# Patient Record
Sex: Male | Born: 1960
Health system: Southern US, Community
[De-identification: ages and names within clinical notes are randomized; demographics above are authoritative.]

## PROBLEM LIST (undated history)

## (undated) DIAGNOSIS — H9190 Unspecified hearing loss, unspecified ear: Secondary | ICD-10-CM

## (undated) DIAGNOSIS — Z8661 Personal history of infections of the central nervous system: Secondary | ICD-10-CM

## (undated) DIAGNOSIS — J45909 Unspecified asthma, uncomplicated: Secondary | ICD-10-CM

## (undated) DIAGNOSIS — K922 Gastrointestinal hemorrhage, unspecified: Secondary | ICD-10-CM

## (undated) DIAGNOSIS — E669 Obesity, unspecified: Secondary | ICD-10-CM

## (undated) DIAGNOSIS — C61 Malignant neoplasm of prostate: Secondary | ICD-10-CM

## (undated) DIAGNOSIS — Z9189 Other specified personal risk factors, not elsewhere classified: Secondary | ICD-10-CM

## (undated) DIAGNOSIS — I10 Essential (primary) hypertension: Secondary | ICD-10-CM

## (undated) DIAGNOSIS — Z87898 Personal history of other specified conditions: Secondary | ICD-10-CM

## (undated) DIAGNOSIS — T7840XA Allergy, unspecified, initial encounter: Secondary | ICD-10-CM

## (undated) HISTORY — DX: Gastrointestinal hemorrhage, unspecified: K92.2

## (undated) HISTORY — DX: Unspecified hearing loss, unspecified ear: H91.90

## (undated) HISTORY — DX: Unspecified asthma, uncomplicated: J45.909

## (undated) HISTORY — PX: PROSTATE BIOPSY: SHX241

## (undated) HISTORY — DX: Malignant neoplasm of prostate: C61

## (undated) HISTORY — DX: Allergy, unspecified, initial encounter: T78.40XA

## (undated) HISTORY — DX: Obesity, unspecified: E66.9

---

## 2012-11-17 ENCOUNTER — Encounter (HOSPITAL_BASED_OUTPATIENT_CLINIC_OR_DEPARTMENT_OTHER): Payer: Self-pay | Admitting: Emergency Medicine

## 2012-11-17 ENCOUNTER — Emergency Department (HOSPITAL_BASED_OUTPATIENT_CLINIC_OR_DEPARTMENT_OTHER)
Admission: EM | Admit: 2012-11-17 | Discharge: 2012-11-17 | Disposition: A | Discharge status: Home / Self Care | Payer: 59 | Attending: Emergency Medicine | Admitting: Emergency Medicine

## 2012-11-17 ENCOUNTER — Emergency Department (HOSPITAL_BASED_OUTPATIENT_CLINIC_OR_DEPARTMENT_OTHER): Payer: 59

## 2012-11-17 DIAGNOSIS — I10 Essential (primary) hypertension: Secondary | ICD-10-CM | POA: Insufficient documentation

## 2012-11-17 DIAGNOSIS — X58XXXA Exposure to other specified factors, initial encounter: Secondary | ICD-10-CM | POA: Insufficient documentation

## 2012-11-17 DIAGNOSIS — F17200 Nicotine dependence, unspecified, uncomplicated: Secondary | ICD-10-CM | POA: Insufficient documentation

## 2012-11-17 DIAGNOSIS — S335XXA Sprain of ligaments of lumbar spine, initial encounter: Secondary | ICD-10-CM | POA: Insufficient documentation

## 2012-11-17 DIAGNOSIS — Y939 Activity, unspecified: Secondary | ICD-10-CM | POA: Insufficient documentation

## 2012-11-17 DIAGNOSIS — Y929 Unspecified place or not applicable: Secondary | ICD-10-CM | POA: Insufficient documentation

## 2012-11-17 DIAGNOSIS — S39012A Strain of muscle, fascia and tendon of lower back, initial encounter: Secondary | ICD-10-CM

## 2012-11-17 DIAGNOSIS — Z79899 Other long term (current) drug therapy: Secondary | ICD-10-CM | POA: Insufficient documentation

## 2012-11-17 HISTORY — DX: Essential (primary) hypertension: I10

## 2012-11-17 LAB — URINALYSIS, ROUTINE W REFLEX MICROSCOPIC
Glucose, UA: NEGATIVE mg/dL
Hgb urine dipstick: NEGATIVE
Leukocytes, UA: NEGATIVE
Nitrite: NEGATIVE
Specific Gravity, Urine: 1.013 (ref 1.005–1.030)
Urobilinogen, UA: 0.2 mg/dL (ref 0.0–1.0)

## 2012-11-17 MED ORDER — HYDROCODONE-ACETAMINOPHEN 5-325 MG PO TABS: 2.0000 | ORAL_TABLET | ORAL | Status: DC | PRN

## 2012-11-17 MED ORDER — PREDNISONE 10 MG PO TABS: ORAL_TABLET | ORAL | Status: DC

## 2012-11-17 NOTE — ED Provider Notes (Signed)
CSN: [id number]     Arrival date & time 11/17/12  1340 History   First MD Initiated Contact with Patient 11/17/12 1557     Chief Complaint  Patient presents with  . Flank Pain   (Consider location/radiation/quality/duration/timing/severity/associated sxs/prior Treatment) Patient is a 52 y.o. male presenting with back pain. The history is provided by the patient. No language interpreter was used.  Back Pain Location:  Lumbar spine Quality:  Aching Radiates to:  Does not radiate Pain severity:  Moderate Onset quality:  Gradual Duration:  1 week Progression:  Worsening Relieved by:  Nothing Worsened by:  Movement and standing Ineffective treatments:  NSAIDs and muscle relaxants Associated symptoms: no abdominal pain   Pt treated at urgent care for back pain.  Pt reports no relief with medications.  Pt reports pain is worse with movement  Past Medical History  Diagnosis Date  . Hypertension    No past surgical history on file. No family history on file. History  Substance Use Topics  . Smoking status: Current Some Day Smoker  . Smokeless tobacco: Not on file  . Alcohol Use: Not on file    Review of Systems  Gastrointestinal: Negative for abdominal pain.  Musculoskeletal: Positive for back pain.  All other systems reviewed and are negative.    Allergies  Review of patient's allergies indicates no known allergies.  Home Medications   Current Outpatient Rx  Name  Route  Sig  Dispense  Refill  . amLODipine (NORVASC) 10 MG tablet   Oral   Take 10 mg by mouth daily.         . cyclobenzaprine (FLEXERIL) 10 MG tablet   Oral   Take 10 mg by mouth at bedtime as needed for muscle spasms.         Marland Kitchen etodolac (LODINE) 400 MG tablet   Oral   Take 400 mg by mouth 2 (two) times daily as needed.         Marland Kitchen losartan-hydrochlorothiazide (HYZAAR) 100-25 MG per tablet   Oral   Take 1 tablet by mouth daily.         . traMADol (ULTRAM) 50 MG tablet   Oral   Take 50 mg  by mouth every 6 (six) hours as needed for pain.          BP 145/81  Pulse 96  Temp(Src) 98.6 F (37 C) (Oral)  Resp 20  Ht 6\' 5"  (1.956 m)  Wt 298 lb (135.172 kg)  BMI 35.33 kg/m2  SpO2 98% Physical Exam  Nursing note and vitals reviewed. Constitutional: He is oriented to person, place, and time. He appears well-developed and well-nourished.  HENT:  Head: Normocephalic.  Eyes: Pupils are equal, round, and reactive to light.  Neck: Normal range of motion.  Cardiovascular: Normal rate and normal heart sounds.   Pulmonary/Chest: Effort normal.  Abdominal: Soft.  Musculoskeletal: He exhibits tenderness.  diffusely tender ls spine and flank  Pain with movement  nv and ns intact  Neurological: He is alert and oriented to person, place, and time. He has normal reflexes.  Skin: Skin is warm.  Psychiatric: He has a normal mood and affect.    ED Course  Procedures (including critical care time) Labs Review Labs Reviewed  URINALYSIS, ROUTINE W REFLEX MICROSCOPIC - Abnormal; Notable for the following:    Bilirubin Urine MODERATE (*)    All other components within normal limits   Imaging Review No results found.  MDM   1. Lumbar strain,  initial encounter    RX for Hydrocodone and prednisone.   Pt advised to see Dr. Pearletha Forge for recheck this week   Elson Areas, PA-C 11/17/12 1732

## 2012-11-17 NOTE — ED Provider Notes (Signed)
History/physical exam/procedure(s) were performed by non-physician practitioner and as supervising physician I was immediately available for consultation/collaboration. I have reviewed all notes and am in agreement with care and plan.   Hilario Quarry, MD 11/17/12 [phone]

## 2012-11-17 NOTE — ED Notes (Signed)
Pt having left flank pain x one week.  No urinary symptoms, no fever.  Pt seen at urgent care and was treated for "nerve pain".

## 2013-11-07 ENCOUNTER — Other Ambulatory Visit (HOSPITAL_COMMUNITY): Payer: Self-pay | Admitting: Urology

## 2013-11-07 DIAGNOSIS — C61 Malignant neoplasm of prostate: Secondary | ICD-10-CM

## 2013-11-21 ENCOUNTER — Encounter (HOSPITAL_COMMUNITY): Payer: 59

## 2013-11-21 ENCOUNTER — Ambulatory Visit (HOSPITAL_COMMUNITY): Payer: 59

## 2014-01-09 ENCOUNTER — Encounter: Payer: Self-pay | Admitting: Radiation Oncology

## 2014-01-09 NOTE — Progress Notes (Signed)
GU Location of Tumor / Histology: prostatic adenocarcinoma  If Prostate Cancer, Gleason Score is (3 + 3) and PSA is (12.79)  Peter Camacho presented with a elevated PSA 01/03/2013.  Biopsies of prostate (if applicable) revealed:    Past/Anticipated interventions by urology, if any: biopsy and referral to radiation oncology  Past/Anticipated interventions by medical oncology, if any: no  Weight changes, if any: no  Bowel/Bladder complaints, if any:    Nausea/Vomiting, if any: no  Pain issues, if any:  no  SAFETY ISSUES:  Prior radiation?   Pacemaker/ICD?   Possible current pregnancy? no  Is the patient on methotrexate? no  Current Complaints / other details:  53 year old year. Interested in radioactive seed implantation. Prostate volume 38.5 cc.

## 2014-01-11 ENCOUNTER — Encounter: Payer: Self-pay | Admitting: Radiation Oncology

## 2014-01-11 DIAGNOSIS — C61 Malignant neoplasm of prostate: Secondary | ICD-10-CM | POA: Insufficient documentation

## 2014-01-11 NOTE — Progress Notes (Signed)
Radiation Oncology         [phone] ________________________________  Initial outpatient Consultation  Name: Peter Camacho MRN: [id number]  Date: 01/12/2014  DOB: Dec 17, 1960  LK:GMWN, Peter Camacho., MD   REFERRING PHYSICIAN: Sharlene Camacho., MD  DIAGNOSIS: 53 y.o. gentleman with stage T1c adenocarcinoma of the prostate with a Gleason's score of 3+4 and a PSA of 12.79    ICD-9-CM ICD-10-CM   1. Prostate Cancer with a Gleason's Score of 3+4 and a PSA of 12.79 Peter Camacho is a 53 y.o. gentleman.  He was noted to have an elevated PSA of 7.93 prompting prostate biopsy with Peter Camacho in [facility] on 01/03/13 revealing a 38.5 mL gland and 4 out of 12 biopsis were positive for Gleason's 3+3.  The patient selected active surveillance.   He was seen in evaluation in urology by Peter Camacho and felt to have T1c disease.  PSA on 03/19/13 was 8.38 and on 09/16/13 was 12.79.  The patient proceeded to transrectal ultrasound with 12 biopsies of the prostate on 10/24/13.  The prostate volume measured 48.32 cc.  Out of 12 core biopsies, 6 were positive.  The maximum Gleason score was 3+4, and this was seen in the distribution displayed below.    The patient reviewed the biopsy results with his urologist and underwent staging CT and bone scan showing no metastatic disease.  He has kindly been referred today for discussion of potential radiation treatment options.  PREVIOUS RADIATION THERAPY: No  PAST MEDICAL HISTORY:  has a past medical history of Hypertension and Prostate cancer.    PAST SURGICAL HISTORY: Past Surgical History  Procedure Laterality Date  . Prostate biopsy      FAMILY HISTORY: family history is negative for Cancer.  SOCIAL HISTORY:  reports that he has been smoking Cigarettes.  He has been smoking about 0.00 packs per day. He has never used smokeless tobacco. He reports that he does not drink alcohol or use illicit  drugs.  ALLERGIES: Fish-derived products  MEDICATIONS:  Current Outpatient Prescriptions  Medication Sig Dispense Refill  . amLODipine (NORVASC) 10 MG tablet Take 10 mg by mouth daily.    Marland Kitchen losartan-hydrochlorothiazide (HYZAAR) 100-25 MG per tablet Take 1 tablet by mouth daily.     No current facility-administered medications for this encounter.    REVIEW OF SYSTEMS:  A 15 point review of systems is documented in the electronic medical record. This was obtained by the nursing staff. However, I reviewed this with the patient to discuss relevant findings and make appropriate changes.   .  The patient completed an IPSS and IIEF questionnaire.  His IPSS score was 3 indicating mild urinary outflow obstructive symptoms.  He indicated that his erectile function is able to complete sexual activity on most attempts.   PHYSICAL EXAM: This patient is in no acute distress.  He is alert and oriented.   weight is 323 lb 9.6 oz (146.784 kg). His blood pressure is 145/77 and his pulse is 105. His respiration is 18.  He exhibits no respiratory distress or labored breathing.  He appears neurologically intact.  His mood is pleasant.  His affect is appropriate.  Please note the digital rectal exam findings described above.  KPS = 100  100 - Normal; no complaints; no evidence of disease. 90   - Able to carry on normal activity; minor signs or symptoms of disease. 80   - Normal  activity with effort; some signs or symptoms of disease. 73   - Cares for self; unable to carry on normal activity or to do active work. 60   - Requires occasional assistance, but is able to care for most of his personal needs. 50   - Requires considerable assistance and frequent medical care. 60   - Disabled; requires special care and assistance. 6   - Severely disabled; hospital admission is indicated although death not imminent. 38   - Very sick; hospital admission necessary; active supportive treatment necessary. 10   - Moribund;  fatal processes progressing rapidly. 0     - Dead  Karnofsky DA, Abelmann WH, Craver LS and Burchenal [facility] [phone]) The use of the nitrogen mustards in the palliative treatment of carcinoma: with particular reference to bronchogenic carcinoma Cancer 1 [phone]    RADIOGRAPHY: Bone Scan and CT Abd/Pelvis show no mets.    IMPRESSION: This gentleman is a 53 y.o. gentleman with stage T1c adenocarcinoma of the prostate with a Gleason's score of 3+4 and a PSA of 12.79.  His T-Stage, Gleason's Score, and PSA put him into the intermediate risk group.  He falls into a select subset of intermediate risk patients with a primary Gleason grade of 3 and less than one half of 1 lobe involvement with intermediate risk disease, eligible for prostate seed implant as monotherapy.Accordingly he is eligible for a variety of potential treatment options including robotic-assisted-laparoscopic radical prostatectomy, external beam radiation treatment, and prostate seed implant as monotherapy.Marland Kitchen  PLAN:Today I reviewed the findings and workup thus far.  We discussed the natural history of prostate cancer.  We reviewed the the implications of T-stage, Gleason's Score, and PSA on decision-making and outcomes in prostate cancer.  We discussed radiation treatment in the management of prostate cancer with regard to the logistics and delivery of external beam radiation treatment as well as the logistics and delivery of prostate brachytherapy.  We compared and contrasted each of these approaches and also compared these against prostatectomy.  The patient expressed interest in prostate brachytherapy.  I filled out a patient counseling form for him with relevant treatment diagrams and we retained a copy for our records.   The patient would like to proceed with prostate brachytherapy.  I will share my findings with Peter Camacho and move forward with scheduling the procedure in the near future.     I enjoyed meeting with him today, and will look  forward to participating in the care of this very nice gentleman.   I spent 60 minutes face to face with the patient and more than 50% of that time was spent in counseling and/or coordination of care.   ------------------------------------------------  Sheral Apley. Tammi Klippel, M.D.

## 2014-01-12 ENCOUNTER — Ambulatory Visit
Admission: RE | Admit: 2014-01-12 | Discharge: 2014-01-12 | Disposition: A | Discharge status: Home / Self Care | Payer: 59 | Source: Ambulatory Visit | Attending: Radiation Oncology | Admitting: Radiation Oncology

## 2014-01-12 ENCOUNTER — Telehealth: Payer: Self-pay | Admitting: Radiation Oncology

## 2014-01-12 ENCOUNTER — Encounter: Payer: Self-pay | Admitting: Radiation Oncology

## 2014-01-12 VITALS — BP 145/77 | HR 105 | Resp 18 | Wt 323.6 lb

## 2014-01-12 DIAGNOSIS — C61 Malignant neoplasm of prostate: Secondary | ICD-10-CM

## 2014-01-12 NOTE — Telephone Encounter (Signed)
Spoke w/Sabrina with, Mr. Peter Camacho, about two upcoming appts: 01/20/14 with Dr. Karsten Ro for fudicial markers. Heather (Dr. Simone Curia nurse) stated that she will be mailing out to him a packet of information on what to do. The other date is 01/30/14 for CT SIM.

## 2014-01-12 NOTE — Progress Notes (Signed)
See progress note under physician encounter. 

## 2014-01-12 NOTE — Progress Notes (Signed)
Denies hematuria or dysuria. Reports nocturia x2. Denies diarrhea, constipation or pain associated with bowel movements. Denies incontinence or urgency. Denies bone pain or night sweats. Denies weight loss. Vitals stable. Denies headache, dizziness, nausea, or vomiting. Hearing aid noted in patient's left ear.

## 2014-01-15 ENCOUNTER — Telehealth: Payer: Self-pay | Admitting: Radiation Oncology

## 2014-01-15 NOTE — Telephone Encounter (Signed)
Called and explained to both [place] and Mr. Barg that the gold seed markers have been cancelled and switched to getting an implant for brachytherapy.  We will still keep the preseed CT on 12/4 and they were in agreement. I am awaiting a call back from [facility] Jeannene Patella) to schedule the Urologist Karsten Ro) for the implant.

## 2014-01-16 ENCOUNTER — Other Ambulatory Visit: Payer: Self-pay | Admitting: Urology

## 2014-01-19 ENCOUNTER — Telehealth: Payer: Self-pay | Admitting: *Deleted

## 2014-01-19 NOTE — Telephone Encounter (Signed)
Called patient to inform of implant date, spoke with [place] and she is aware of this date.

## 2014-01-20 ENCOUNTER — Telehealth: Payer: Self-pay | Admitting: *Deleted

## 2014-01-20 NOTE — Telephone Encounter (Signed)
CALLED PATIENT TO INFORM OF PRE-SEED APPT., CHEST X-RAY AND EKG AND IMPLANT DATE SPOKE WITH PATIENT AND HE IS AWARE OF THESE APPTS.

## 2014-01-29 ENCOUNTER — Telehealth: Payer: Self-pay | Admitting: *Deleted

## 2014-01-29 NOTE — Telephone Encounter (Signed)
Called patient to remind of appts. For 01-30-14, spoke with patient and he is aware of these appts.

## 2014-01-30 ENCOUNTER — Ambulatory Visit
Admission: RE | Admit: 2014-01-30 | Discharge: 2014-01-30 | Disposition: A | Discharge status: Home / Self Care | Payer: 59 | Source: Ambulatory Visit | Attending: Radiation Oncology | Admitting: Radiation Oncology

## 2014-01-30 DIAGNOSIS — C61 Malignant neoplasm of prostate: Secondary | ICD-10-CM

## 2014-01-30 NOTE — Progress Notes (Signed)
  Radiation Oncology         [phone] ________________________________  Name: Peter Camacho MRN: [id number]  Date: 01/30/2014  DOB: 1960/11/23  SIMULATION AND TREATMENT PLANNING NOTE PUBIC ARCH STUDY  VO:JJKK, Rollene Rotunda.  Claybon Jabs, MD  DIAGNOSIS: 53 y.o. gentleman with stage T1c adenocarcinoma of the prostate with a Gleason's score of 3+4 and a PSA of 12.79     ICD-9-CM ICD-10-CM   1. Prostate Cancer with a Gleason's Score of 3+4 and a PSA of 12.79 185 C61     COMPLEX SIMULATION:  The patient presented today for evaluation for possible prostate seed implant. He was brought to the radiation planning suite and placed supine on the CT couch. A 3-dimensional image study set was obtained in upload to the planning computer. There, on each axial slice, I contoured the prostate gland. Then, using three-dimensional radiation planning tools I reconstructed the prostate in view of the structures from the transperineal needle pathway to assess for possible pubic arch interference. In doing so, I did not appreciate any pubic arch interference. Also, the patient's prostate volume was estimated based on the drawn structure. The volume was 35 cc, but, volume study ultrasound was 48 cc so we will plan for 48 cc.  Given the pubic arch appearance and prostate volume, patient remains a good candidate to proceed with prostate seed implant. Today, he freely provided informed written consent to proceed.    PLAN: The patient will undergo prostate seed implant.   ________________________________  Sheral Apley. Tammi Klippel, M.D.

## 2014-02-02 ENCOUNTER — Other Ambulatory Visit: Payer: Self-pay

## 2014-02-02 ENCOUNTER — Ambulatory Visit (HOSPITAL_BASED_OUTPATIENT_CLINIC_OR_DEPARTMENT_OTHER)
Admission: RE | Admit: 2014-02-02 | Discharge: 2014-02-02 | Disposition: A | Discharge status: Home / Self Care | Payer: 59 | Source: Ambulatory Visit | Attending: Urology | Admitting: Urology

## 2014-02-02 ENCOUNTER — Encounter (HOSPITAL_BASED_OUTPATIENT_CLINIC_OR_DEPARTMENT_OTHER)
Admission: RE | Admit: 2014-02-02 | Discharge: 2014-02-02 | Disposition: A | Discharge status: Home / Self Care | Payer: 59 | Source: Ambulatory Visit | Attending: Urology | Admitting: Urology

## 2014-02-02 DIAGNOSIS — I10 Essential (primary) hypertension: Secondary | ICD-10-CM | POA: Insufficient documentation

## 2014-02-02 DIAGNOSIS — C61 Malignant neoplasm of prostate: Secondary | ICD-10-CM | POA: Diagnosis not present

## 2014-02-02 DIAGNOSIS — Z01818 Encounter for other preprocedural examination: Secondary | ICD-10-CM | POA: Diagnosis not present

## 2014-02-12 ENCOUNTER — Telehealth: Payer: Self-pay | Admitting: *Deleted

## 2014-02-12 NOTE — Telephone Encounter (Signed)
CALLED PATIENT TO ASK QUESTION, LVM FOR A RETURN CALL 

## 2014-03-05 ENCOUNTER — Telehealth: Payer: Self-pay | Admitting: *Deleted

## 2014-03-05 NOTE — Telephone Encounter (Signed)
CALLED PATIENT TO REMIND OF LAB FOR 03-06-14, SPOKE WITH PATIENT AND HE IS AWARE OF THIS APPT.

## 2014-03-06 ENCOUNTER — Telehealth: Payer: Self-pay | Admitting: *Deleted

## 2014-03-06 NOTE — Telephone Encounter (Signed)
CALLED PATIENT TO INFORM THAT IMPLANT HAS BEEN MOVED TO 03-20-14 @ 7:30 AM , SPOKE WITH PATIENT AND HE IS AWARE OF THIS CHANGE AND IS GOOD WITH IT.

## 2014-03-12 ENCOUNTER — Telehealth: Payer: Self-pay | Admitting: *Deleted

## 2014-03-12 NOTE — Telephone Encounter (Signed)
CALLED PATIENT TO REMIND OF LAB FOR IMPLANT ON 03-13-14, SPOKE WITH PATIENT AND HE IS AWARE OF THIS APPT.

## 2014-03-13 DIAGNOSIS — N529 Male erectile dysfunction, unspecified: Secondary | ICD-10-CM | POA: Diagnosis not present

## 2014-03-13 DIAGNOSIS — Z91013 Allergy to seafood: Secondary | ICD-10-CM | POA: Diagnosis not present

## 2014-03-13 DIAGNOSIS — C61 Malignant neoplasm of prostate: Secondary | ICD-10-CM | POA: Diagnosis not present

## 2014-03-13 DIAGNOSIS — F17210 Nicotine dependence, cigarettes, uncomplicated: Secondary | ICD-10-CM | POA: Diagnosis not present

## 2014-03-13 DIAGNOSIS — I10 Essential (primary) hypertension: Secondary | ICD-10-CM | POA: Diagnosis not present

## 2014-03-13 DIAGNOSIS — Z6841 Body Mass Index (BMI) 40.0 and over, adult: Secondary | ICD-10-CM | POA: Diagnosis not present

## 2014-03-13 LAB — COMPREHENSIVE METABOLIC PANEL
ALT: 23 U/L (ref 0–53)
AST: 22 U/L (ref 0–37)
Albumin: 3.8 g/dL (ref 3.5–5.2)
Alkaline Phosphatase: 100 U/L (ref 39–117)
Anion gap: 6 (ref 5–15)
BUN: 19 mg/dL (ref 6–23)
CO2: 27 mmol/L (ref 19–32)
Calcium: 8.6 mg/dL (ref 8.4–10.5)
Chloride: 101 mEq/L (ref 96–112)
Creatinine, Ser: 0.86 mg/dL (ref 0.50–1.35)
GFR calc Af Amer: >90 mL/min (ref >90)
GFR calc non Af Amer: >90 mL/min (ref >90)
Glucose, Bld: 100 mg/dL — ABNORMAL HIGH (ref 70–99)
Potassium: 3.7 mmol/L (ref 3.5–5.1)
Sodium: 134 mmol/L — ABNORMAL LOW (ref 135–145)
Total Bilirubin: 0.6 mg/dL (ref 0.3–1.2)
Total Protein: 7.7 g/dL (ref 6.0–8.3)

## 2014-03-13 LAB — CBC
HCT: 43.2 % (ref 39.0–52.0)
Hemoglobin: 14.5 g/dL (ref 13.0–17.0)
MCH: 30.3 pg (ref 26.0–34.0)
MCHC: 33.6 g/dL (ref 30.0–36.0)
MCV: 90.2 fL (ref 78.0–100.0)
Platelets: 283 10*3/uL (ref 150–400)
RBC: 4.79 MIL/uL (ref 4.22–5.81)
RDW: 15.4 % (ref 11.5–15.5)
WBC: 9.2 10*3/uL (ref 4.0–10.5)

## 2014-03-13 LAB — APTT: aPTT: 29 seconds (ref 24–37)

## 2014-03-13 LAB — PROTIME-INR
INR: 1.02 (ref 0.00–1.49)
Prothrombin Time: 13.5 seconds (ref 11.6–15.2)

## 2014-03-17 ENCOUNTER — Encounter (HOSPITAL_BASED_OUTPATIENT_CLINIC_OR_DEPARTMENT_OTHER): Payer: Self-pay | Admitting: *Deleted

## 2014-03-17 NOTE — Progress Notes (Signed)
NPO AFTER MN. ARRIVE AT 0600. CURRENT LAB RESULTS, EKG, AND CXR IN CHART AND EPIC. WILL TAKE NORVASC AM DOS W/ SIPS OF WATER AND DO FLEET ENEMA.

## 2014-03-17 NOTE — Progress Notes (Signed)
   03/17/14 1543  OBSTRUCTIVE SLEEP APNEA  Have you ever been diagnosed with sleep apnea through a sleep study? No  Do you snore loudly (loud enough to be heard through closed doors)?  0  Do you often feel tired, fatigued, or sleepy during the daytime? 0  Has anyone observed you stop breathing during your sleep? 0  Do you have, or are you being treated for high blood pressure? 1  BMI more than 35 kg/m2? 1  Age over 55 years old? 1  Neck circumference greater than 40 cm/16 inches? 1  Gender: 1  Obstructive Sleep Apnea Score 5  Score 4 or greater  Results sent to PCP

## 2014-03-19 ENCOUNTER — Telehealth: Payer: Self-pay | Admitting: *Deleted

## 2014-03-19 NOTE — Anesthesia Preprocedure Evaluation (Addendum)
Anesthesia Evaluation  Patient identified by MRN, date of birth, ID band Patient awake    Reviewed: Allergy & Precautions, H&P , NPO status , Patient's Chart, lab work & pertinent test results  Airway Mallampati: III  TM Distance: >3 FB Neck ROM: full    Dental no notable dental hx. (+) Teeth Intact, Dental Advisory Given   Pulmonary neg pulmonary ROS, Current Smoker,  Stop bang 5 breath sounds clear to auscultation  Pulmonary exam normal       Cardiovascular Exercise Tolerance: Good hypertension, Pt. on medications negative cardio ROS  Rhythm:regular Rate:Normal  Prolonged QT   Neuro/Psych negative neurological ROS  negative psych ROS   GI/Hepatic negative GI ROS, Neg liver ROS,   Endo/Other  negative endocrine ROSMorbid obesity  Renal/GU negative Renal ROS  negative genitourinary   Musculoskeletal   Abdominal (+) + obese,   Peds  Hematology negative hematology ROS (+)   Anesthesia Other Findings Prostate cancer  Reproductive/Obstetrics negative OB ROS                            Anesthesia Physical Anesthesia Plan  ASA: III  Anesthesia Plan: General   Post-op Pain Management:    Induction: Intravenous  Airway Management Planned: LMA  Additional Equipment:   Intra-op Plan:   Post-operative Plan:   Informed Consent: I have reviewed the patients History and Physical, chart, labs and discussed the procedure including the risks, benefits and alternatives for the proposed anesthesia with the patient or authorized representative who has indicated his/her understanding and acceptance.   Dental Advisory Given  Plan Discussed with: CRNA and Surgeon  Anesthesia Plan Comments:         Anesthesia Quick Evaluation

## 2014-03-19 NOTE — Telephone Encounter (Signed)
CALLED PATIENT TO REMIND OF PROCEDURE FOR  03-20-14, LVM FOR A RETURN CALL

## 2014-03-20 ENCOUNTER — Ambulatory Visit (HOSPITAL_BASED_OUTPATIENT_CLINIC_OR_DEPARTMENT_OTHER): Payer: 59 | Admitting: Anesthesiology

## 2014-03-20 ENCOUNTER — Ambulatory Visit (HOSPITAL_BASED_OUTPATIENT_CLINIC_OR_DEPARTMENT_OTHER)
Admission: RE | Admit: 2014-03-20 | Discharge: 2014-03-20 | Disposition: A | Discharge status: Home / Self Care | Payer: 59 | Source: Ambulatory Visit | Attending: Urology | Admitting: Urology

## 2014-03-20 ENCOUNTER — Encounter (HOSPITAL_BASED_OUTPATIENT_CLINIC_OR_DEPARTMENT_OTHER): Payer: Self-pay | Admitting: *Deleted

## 2014-03-20 ENCOUNTER — Ambulatory Visit (HOSPITAL_COMMUNITY): Payer: 59

## 2014-03-20 ENCOUNTER — Encounter (HOSPITAL_BASED_OUTPATIENT_CLINIC_OR_DEPARTMENT_OTHER)
Admission: RE | Disposition: A | Discharge status: Home / Self Care | Payer: Self-pay | Source: Ambulatory Visit | Attending: Urology

## 2014-03-20 DIAGNOSIS — C61 Malignant neoplasm of prostate: Secondary | ICD-10-CM | POA: Diagnosis not present

## 2014-03-20 DIAGNOSIS — Z91013 Allergy to seafood: Secondary | ICD-10-CM | POA: Insufficient documentation

## 2014-03-20 DIAGNOSIS — Z6841 Body Mass Index (BMI) 40.0 and over, adult: Secondary | ICD-10-CM | POA: Insufficient documentation

## 2014-03-20 DIAGNOSIS — N529 Male erectile dysfunction, unspecified: Secondary | ICD-10-CM | POA: Insufficient documentation

## 2014-03-20 DIAGNOSIS — F17210 Nicotine dependence, cigarettes, uncomplicated: Secondary | ICD-10-CM | POA: Insufficient documentation

## 2014-03-20 DIAGNOSIS — I10 Essential (primary) hypertension: Secondary | ICD-10-CM | POA: Insufficient documentation

## 2014-03-20 HISTORY — PX: RADIOACTIVE SEED IMPLANT: SHX5150

## 2014-03-20 HISTORY — DX: Personal history of other specified conditions: Z87.898

## 2014-03-20 HISTORY — DX: Other specified personal risk factors, not elsewhere classified: Z91.89

## 2014-03-20 HISTORY — DX: Personal history of infections of the central nervous system: Z86.61

## 2014-03-20 SURGERY — INSERTION, RADIATION SOURCE, PROSTATE
Anesthesia: General | Site: Prostate

## 2014-03-20 MED ORDER — MIDAZOLAM HCL 2 MG/2ML IJ SOLN
INTRAMUSCULAR | Status: AC
  Filled 2014-03-20: qty 2

## 2014-03-20 MED ORDER — LACTATED RINGERS IV SOLN
INTRAVENOUS | Status: DC
  Administered 2014-03-20 (×3): via INTRAVENOUS
  Filled 2014-03-20: qty 1000

## 2014-03-20 MED ORDER — CIPROFLOXACIN IN D5W 400 MG/200ML IV SOLN
INTRAVENOUS | Status: AC
  Filled 2014-03-20: qty 200

## 2014-03-20 MED ORDER — FLEET ENEMA 7-19 GM/118ML RE ENEM
1.0000 | ENEMA | Freq: Once | RECTAL | Status: DC
  Filled 2014-03-20: qty 1

## 2014-03-20 MED ORDER — MIDAZOLAM HCL 5 MG/5ML IJ SOLN
INTRAMUSCULAR | Status: DC | PRN
  Administered 2014-03-20: 2 mg via INTRAVENOUS

## 2014-03-20 MED ORDER — LACTATED RINGERS IV SOLN
INTRAVENOUS | Status: DC
Start: 2014-03-20 — End: 2014-03-20
  Filled 2014-03-20: qty 1000

## 2014-03-20 MED ORDER — HYDROCODONE-ACETAMINOPHEN 10-325 MG PO TABS: 1.0000 | ORAL_TABLET | ORAL | Status: DC | PRN

## 2014-03-20 MED ORDER — HYDROCODONE-ACETAMINOPHEN 10-325 MG PO TABS
1.0000 | ORAL_TABLET | ORAL | Status: DC | PRN
  Administered 2014-03-20: 1 via ORAL
  Filled 2014-03-20: qty 2

## 2014-03-20 MED ORDER — DEXAMETHASONE SODIUM PHOSPHATE 4 MG/ML IJ SOLN
INTRAMUSCULAR | Status: DC | PRN
  Administered 2014-03-20: 10 mg via INTRAVENOUS

## 2014-03-20 MED ORDER — PROPOFOL 10 MG/ML IV BOLUS
INTRAVENOUS | Status: DC | PRN
  Administered 2014-03-20: 20 mg via INTRAVENOUS
  Administered 2014-03-20: 30 mg via INTRAVENOUS
  Administered 2014-03-20: 10 mg via INTRAVENOUS
  Administered 2014-03-20 (×2): 20 mg via INTRAVENOUS
  Administered 2014-03-20: 300 mg via INTRAVENOUS

## 2014-03-20 MED ORDER — CIPROFLOXACIN HCL 500 MG PO TABS: 500.0000 mg | ORAL_TABLET | Freq: Two times a day (BID) | ORAL | Status: DC

## 2014-03-20 MED ORDER — ONDANSETRON HCL 4 MG/2ML IJ SOLN
INTRAMUSCULAR | Status: DC | PRN
  Administered 2014-03-20: 4 mg via INTRAVENOUS

## 2014-03-20 MED ORDER — ACETAMINOPHEN 10 MG/ML IV SOLN
INTRAVENOUS | Status: DC | PRN
  Administered 2014-03-20: 1000 mg via INTRAVENOUS

## 2014-03-20 MED ORDER — HYDROCODONE-ACETAMINOPHEN 10-325 MG PO TABS
ORAL_TABLET | ORAL | Status: AC
  Filled 2014-03-20: qty 1

## 2014-03-20 MED ORDER — LABETALOL HCL 5 MG/ML IV SOLN
INTRAVENOUS | Status: DC | PRN
  Administered 2014-03-20 (×2): 5 mg via INTRAVENOUS

## 2014-03-20 MED ORDER — METOCLOPRAMIDE HCL 5 MG/ML IJ SOLN
INTRAMUSCULAR | Status: DC | PRN
  Administered 2014-03-20: 10 mg via INTRAVENOUS

## 2014-03-20 MED ORDER — STERILE WATER FOR IRRIGATION IR SOLN
Status: DC | PRN
  Administered 2014-03-20: 3500 mL

## 2014-03-20 MED ORDER — PHENYLEPHRINE HCL 10 MG/ML IJ SOLN
INTRAMUSCULAR | Status: DC | PRN
  Administered 2014-03-20 (×3): 40 ug via INTRAVENOUS

## 2014-03-20 MED ORDER — FENTANYL CITRATE 0.05 MG/ML IJ SOLN
INTRAMUSCULAR | Status: DC | PRN
  Administered 2014-03-20 (×2): 50 ug via INTRAVENOUS
  Administered 2014-03-20 (×2): 25 ug via INTRAVENOUS
  Administered 2014-03-20: 50 ug via INTRAVENOUS
  Administered 2014-03-20 (×2): 25 ug via INTRAVENOUS
  Administered 2014-03-20: 50 ug via INTRAVENOUS

## 2014-03-20 MED ORDER — FENTANYL CITRATE 0.05 MG/ML IJ SOLN
25.0000 ug | INTRAMUSCULAR | Status: DC | PRN
  Filled 2014-03-20: qty 1

## 2014-03-20 MED ORDER — CIPROFLOXACIN IN D5W 400 MG/200ML IV SOLN
400.0000 mg | INTRAVENOUS | Status: AC
  Administered 2014-03-20: 400 mg via INTRAVENOUS
  Filled 2014-03-20: qty 200

## 2014-03-20 MED ORDER — LIDOCAINE HCL (CARDIAC) 20 MG/ML IV SOLN
INTRAVENOUS | Status: DC | PRN
  Administered 2014-03-20: 100 mg via INTRAVENOUS

## 2014-03-20 MED ORDER — EPHEDRINE SULFATE 50 MG/ML IJ SOLN
INTRAMUSCULAR | Status: DC | PRN
  Administered 2014-03-20: 10 mg via INTRAVENOUS

## 2014-03-20 MED ORDER — FENTANYL CITRATE 0.05 MG/ML IJ SOLN
INTRAMUSCULAR | Status: AC
  Filled 2014-03-20: qty 6

## 2014-03-20 MED ORDER — IOHEXOL 350 MG/ML SOLN
INTRAVENOUS | Status: DC | PRN
  Administered 2014-03-20: 7 mL

## 2014-03-20 SURGICAL SUPPLY — 27 items
BAG URINE DRAINAGE (UROLOGICAL SUPPLIES) ×2 IMPLANT
BLADE CLIPPER SURG (BLADE) ×2 IMPLANT
CATH FOLEY 2WAY SLVR  5CC 16FR (CATHETERS) ×2
CATH FOLEY 2WAY SLVR 5CC 16FR (CATHETERS) ×2 IMPLANT
CATH ROBINSON RED A/P 20FR (CATHETERS) ×2 IMPLANT
CLOTH BEACON ORANGE TIMEOUT ST (SAFETY) ×2 IMPLANT
COVER MAYO STAND STRL (DRAPES) ×2 IMPLANT
COVER TABLE BACK 60X90 (DRAPES) ×2 IMPLANT
DRSG TEGADERM 4X4.75 (GAUZE/BANDAGES/DRESSINGS) ×2 IMPLANT
DRSG TEGADERM 8X12 (GAUZE/BANDAGES/DRESSINGS) ×2 IMPLANT
GLOVE BIO SURGEON STRL SZ 6.5 (GLOVE) ×2 IMPLANT
GLOVE BIO SURGEON STRL SZ8 (GLOVE) ×4 IMPLANT
GLOVE BIOGEL PI IND STRL 6.5 (GLOVE) ×2 IMPLANT
GLOVE BIOGEL PI INDICATOR 6.5 (GLOVE) ×2
GLOVE ECLIPSE 8.0 STRL XLNG CF (GLOVE) ×8 IMPLANT
GOWN STRL REUS W/ TWL LRG LVL3 (GOWN DISPOSABLE) ×1 IMPLANT
GOWN STRL REUS W/ TWL XL LVL3 (GOWN DISPOSABLE) ×1 IMPLANT
GOWN STRL REUS W/TWL LRG LVL3 (GOWN DISPOSABLE) ×1
GOWN STRL REUS W/TWL XL LVL3 (GOWN DISPOSABLE) ×1
HOLDER FOLEY CATH W/STRAP (MISCELLANEOUS) ×2 IMPLANT
PACK CYSTO (CUSTOM PROCEDURE TRAY) ×2 IMPLANT
SPONGE GAUZE 4X4 12PLY STER LF (GAUZE/BANDAGES/DRESSINGS) ×2 IMPLANT
SYRINGE 10CC LL (SYRINGE) ×2 IMPLANT
UNDERPAD 30X30 INCONTINENT (UNDERPADS AND DIAPERS) ×4 IMPLANT
WATER STERILE IRR 3000ML UROMA (IV SOLUTION) ×2 IMPLANT
WATER STERILE IRR 500ML POUR (IV SOLUTION) ×2 IMPLANT
selectSeed I-125 ×142 IMPLANT

## 2014-03-20 NOTE — H&P (Signed)
Peter Camacho is a 54 year old male with prostate cancer.     Adenocarcinoma of the prostate: He underwent TRUS/BX by Dr. Nevada Crane in [facility] 01/03/13 for PSA of 7.93. This revealed a 38.5 mL prostate.  Pathology: 4/12 cores positive for Gleason 3+3 = 6 (25%, 13%, 90% and 3%)   Treatment: Active surveillance  Repeat TRUS/BX 10/24/13: His PSA had risen to 12.79. His prostate measured 48 cc.  Pathology: Adenocarcinoma Gleason score 3+3 = 6 in 5 cores with a single core of Gleason 3+4 = 7.  Clinical stage: T1c  Past Medical History Problems  1. History of hypertension (V12.59)  Surgical History Problems  1. History of No Surgical Problems  Current Meds 1. Losartan Potassium-HCTZ 100-25 MG Oral Tablet;  Therapy: 21Jun2013 to Recorded  Allergies Medication  1. No Known Drug Allergies  Family History Problems  1. Family history of Acute Myocardial Infarction (V17.3) : Father 2. Family history of Death In The Family Father 3. Family history of Death In The Family Mother  Social History Problems  1. Denied: History of Alcohol Use 2. Caffeine Use 3. Current some day smoker (305.1) 4. Marital History - Single 5. Tobacco use (305.1)   1/2pack for 10 years   Review of Systems Genitourinary, constitutional, skin, eye, otolaryngeal, hematologic/lymphatic, cardiovascular, pulmonary, endocrine, musculoskeletal, gastrointestinal, neurological and psychiatric system(s) were reviewed and pertinent findings if present are noted.    Vitals Vital Signs  BMI Calculated: 37.33 BSA Calculated: 2.57 Height: 6 ft 2.5 in Weight: 294 lb  Blood Pressure: 144 / 89 Heart Rate: 97  Physical Exam Constitutional: Well nourished and well developed . No acute distress.  ENT:. The ears and nose are normal in appearance.  Neck: The appearance of the neck is normal and no neck mass is present.  Pulmonary: No respiratory distress and normal respiratory rhythm and effort.  Cardiovascular: Heart  rate and rhythm are normal . No peripheral edema.  Abdomen: The abdomen is obese. The abdomen is soft and nontender. No masses are palpated. No CVA tenderness. No hernias are palpable. No hepatosplenomegaly noted.  Rectal: Rectal exam demonstrates normal sphincter tone, no tenderness and no masses. The prostate has no nodularity and is not tender. The left seminal vesicle is nonpalpable. The right seminal vesicle is nonpalpable. The perineum is normal on inspection.  Genitourinary: Examination of the penis demonstrates no discharge, no masses, no lesions and a normal meatus. The scrotum is without lesions. The right epididymis is palpably normal and non-tender. The left epididymis is palpably normal and non-tender. The right testis is non-tender and without masses. The left testis is non-tender and without masses.  Lymphatics: The femoral and inguinal nodes are not enlarged or tender.  Skin: Normal skin turgor, no visible rash and no visible skin lesions.  Neuro/Psych:. Mood and affect are appropriate.   CT-PELVIS W/O CONTRAST (Report)   ** RADIOLOGY REPORT BY [facility] **   CLINICAL DATA: Prostate cancer.  EXAM: CT PELVIS WITHOUT CONTRAST  TECHNIQUE: Multidetector CT imaging of the pelvis was performed following the standard protocol without intravenous contrast.  COMPARISON: None.  FINDINGS: Visualized portions of the bowel are unremarkable. Appendix is normal. Bladder is unremarkable. Calcifications are seen in the prostate which is normal in size. Inguinal lymph nodes measure up to 1.3 cm on the left. External iliac chain lymph nodes measure up to 1.2 cm on the left. Bilateral inguinal lymph nodes measure up to 1.3 cm on the left. Scattered atherosclerotic calcification of the arterial vasculature  without abdominal aortic aneurysm. No free fluid. No worrisome lytic or sclerotic lesions.  IMPRESSION: Borderline enlarged bilateral external iliac and inguinal  lymph nodes.   Electronically Signed  By: Lorin Picket M.D.  On: 11/21/2013 10:30  Assessment Assessed  1. Adenocarcinoma of prostate 2. Erectile dysfunction due to arterial insufficiency  Plan   Discussion/Summary AU Prostate Ca Discussion:  The patient was counseled about the natural history of prostate cancer and the standard treatment options that are available for prostate cancer. It was explained to him how his age and life expectancy, clinical stage, Gleason score, and PSA affect his prognosis, the decision to proceed with additional staging studies, as well as how that information influences recommended treatment strategies. We discussed the roles for active surveillance, radiation therapy, surgical therapy, androgen deprivation, as well as ablative therapy options for the treatment of prostate cancer as appropriate to his individual cancer situation. We discussed the risks and benefits of these options with regard to their impact on cancer control and also in terms of potential adverse events, complications, and impact on quiality of life particularly related to urinary, bowel, and sexual function. The patient was encouraged to ask questions throughout the discussion today and all questions were answered to his stated satisfaction. In addition, the patient was provided with and/or directed to appropriate resources and literature for further education about prostate cancer and treatment options.     He is interested in radioactive seed implantation.  He has met with Dr. Tammi Klippel and has elected to proceed with radioactive seed implantation.

## 2014-03-20 NOTE — Anesthesia Procedure Notes (Signed)
Procedure Name: LMA Insertion Date/Time: 03/20/2014 8:04 AM Performed by: Mechele Claude Pre-anesthesia Checklist: Patient identified, Emergency Drugs available, Suction available and Patient being monitored Patient Re-evaluated:Patient Re-evaluated prior to inductionOxygen Delivery Method: Circle System Utilized Preoxygenation: Pre-oxygenation with 100% oxygen Intubation Type: IV induction Ventilation: Mask ventilation without difficulty LMA: LMA inserted LMA Size: 5.0 Number of attempts: 1 Airway Equipment and Method: bite block Placement Confirmation: positive ETCO2 Tube secured with: Tape Dental Injury: Teeth and Oropharynx as per pre-operative assessment

## 2014-03-20 NOTE — Discharge Instructions (Signed)
DISCHARGE INSTRUCTIONS FOR PROSTATE SEED IMPLANTATION ° °Removal of catheter °Remove the foley catheter after 24 hours ( day after the procedure).can be done easily by cutting the side port of the catheter, whichallow the balloon to deflate.  You will see 1-2 teaspoons of clear water as the balloon deflates and then the catheter can be slid out without difficulty. ° ° °     Cut here ° °Antibiotics °You may be given a prescription for an antibiotic to take when you arrive home. If so, be sure to take every tablet in the bottle, even if you are feeling better before the prescription is finished. If you begin itching, notice a rash or start to swell on your trunk, arms, legs and/or throat, immediately stop taking the antibiotic and call your Urologist. °Diet °Resume your usual diet when you return home. To keep your bowels moving easily and softly, drink prune, apple and cranberry juice at room temperature. You may also take a stool softener, such as Colace, which is available without prescription at local pharmacies. °Daily activities °  No driving or heavy lifting for at least two days after the implant. °  No bike riding, horseback riding or riding lawn mowers for the first month after the implant. °  Any strenuous physical activity should be approved by your doctor before you resume it. °Sexual relations °You may resume sexual relations two weeks after the procedure. A condom should be used for the first two weeks. Your semen may be dark brown or black; this is normal and is related bleeding that may have occurred during the implant. °Postoperative swelling °Expect swelling and bruising of the scrotum and perineum (the area between the scrotum and anus). Both the swelling and the bruising should resolve in l or 2 weeks. Ice packs and over- the-counter medications such as Tylenol, Advil or Aleve may lessen your discomfort. °Postoperative urination °Most men experience burning on urination and/or urinary frequency.  If this becomes bothersome, contact your Urologist.  Medication can be prescribed to relieve these problems.  It is normal to have some blood in your urine for a few days after the implant. °Special instructions related to the seeds °It is unlikely that you will pass an Iodine-125 seed in your urine. The seeds are silver in color and are about as large as a grain of rice. If you pass a seed, do not handle it with your fingers. Use a spoon to place it in an envelope or jar in place this in base occluded area such as the garage or basement for return to the radiation clinic at your convenience. ° °Contact your doctor for °  Temperature greater than 101 F °  Increasing pain °  Inability to urinate °Follow-up ° You should have follow up with your urologist and radiation oncologist about 3 weeks after the procedure. °General information regarding Iodine seeds °  Iodine-125 is a low energy radioactive material. It is not deeply penetrating and loses energy at short distances. Your prostate will absorb the radiation. Objects that are touched or used by the patient do not become radioactive. °  Body wastes (urine and stool) or body fluids (saliva, tears, semen or blood) are not radioactive. °  The Nuclear Regulatory Commission (NRC) has determined that no radiation precautions are needed for patients undergoing Iodine-125 seed implantation. The NRC states that such patients do not present a risk to the people around them, including young children and pregnant women. However, in keeping with the general principle   that radiation exposure should be kept as low reasonably possible, we suggest the following: °  Children and pets should not sit on the patient's lap for the first two (2) weeks after the implant. °  Pregnant (or possibly pregnant) women should avoid prolonged, close contact with the patient for the first two (2) weeks after the implant. °  A distance of three (3) feet is acceptable. °At a distance of three (3)  feet, there is no limit to the length of time anyone can be with the patient. °Post Anesthesia Home Care Instructions ° °Activity: °Get plenty of rest for the remainder of the day. A responsible adult should stay with you for 24 hours following the procedure.  °For the next 24 hours, DO NOT: °-Drive a car °-Operate machinery °-Drink alcoholic beverages °-Take any medication unless instructed by your physician °-Make any legal decisions or sign important papers. ° °Meals: °Start with liquid foods such as gelatin or soup. Progress to regular foods as tolerated. Avoid greasy, spicy, heavy foods. If nausea and/or vomiting occur, drink only clear liquids until the nausea and/or vomiting subsides. Call your physician if vomiting continues. ° °Special Instructions/Symptoms: °Your throat may feel dry or sore from the anesthesia or the breathing tube placed in your throat during surgery. If this causes discomfort, gargle with warm salt water. The discomfort should disappear within 24 hours. °   °

## 2014-03-20 NOTE — Op Note (Signed)
PATIENT:  Peter Camacho  PRE-OPERATIVE DIAGNOSIS:  Adenocarcinoma of the prostate  POST-OPERATIVE DIAGNOSIS:  Same  PROCEDURE:  Procedure(s): 1. I-125 radioactive seed implantation 2. Cystoscopy  SURGEON:  Surgeon(s): Claybon Jabs  Radiation oncologist: Dr. Tyler Pita  ANESTHESIA:  General  EBL:  Minimal  DRAINS: 41 French Foley catheter  INDICATION: Donna Bernard  Description of procedure: After informed consent the patient was brought to the major OR, placed on the table and administered general anesthesia. He was then moved to the modified lithotomy position with his perineum perpendicular to the floor. His perineum and genitalia were then sterilely prepped. An official timeout was then performed. A 16 French Foley catheter was then placed in the bladder and filled with dilute contrast, a rectal tube was placed in the rectum and the transrectal ultrasound probe was placed in the rectum and affixed to the stand. He was then sterilely draped.  Real time ultrasonography was used along with the seed planning software [employer] Prostate vs. 4.2.21. This was used to develop the seed plan including the number of needles as well as number of seeds required for complete and adequate coverage. Real-time ultrasonography was then used along with the previously developed plan and the Nucletron device to implant a total of 71 seeds using 26 needles. This proceeded without difficulty or complication.  A Foley catheter was then removed as well as the transrectal ultrasound probe and rectal probe. Flexible cystoscopy was then performed using the 17 French flexible scope which revealed a normal urethra throughout its length down to the sphincter which appeared intact. The prostatic urethra revealed bilobar hypertrophy but no evidence of obstruction, seeds, spacers or lesions. The bladder was then entered and fully and systematically inspected. The ureteral orifices were noted to be of normal  configuration and position. The mucosa revealed no evidence of tumors. There were also no stones identified within the bladder. I noted no seeds or spacers on the floor of the bladder and retroflexion of the scope revealed no seeds protruding from the base of the prostate.  The cystoscope was then removed and a new 40 French Foley catheter was then inserted and the balloon was filled with 10 cc of sterile water. This was connected to closed system drainage and the patient was awakened and taken to recovery room in stable and satisfactory condition. He tolerated procedure well and there were no intraoperative complications.

## 2014-03-20 NOTE — Transfer of Care (Signed)
Immediate Anesthesia Transfer of Care Note  Patient: Peter Camacho  Procedure(s) Performed: Procedure(s) (LRB): RADIOACTIVE SEED IMPLANT (N/A)  Patient Location: PACU  Anesthesia Type: General  Level of Consciousness: awake, alert  and oriented  Airway & Oxygen Therapy: Patient Spontanous Breathing and Patient connected to face mask oxygen  Post-op Assessment: Report given to PACU RN and Post -op Vital signs reviewed and stable  Post vital signs: Reviewed and stable  Complications: No apparent anesthesia complications

## 2014-03-20 NOTE — Anesthesia Postprocedure Evaluation (Signed)
  Anesthesia Post-op Note  Patient: Peter Camacho  Procedure(s) Performed: Procedure(s) (LRB): RADIOACTIVE SEED IMPLANT (N/A)  Patient Location: PACU  Anesthesia Type: General  Level of Consciousness: awake and alert   Airway and Oxygen Therapy: Patient Spontanous Breathing  Post-op Pain: mild  Post-op Assessment: Post-op Vital signs reviewed, Patient's Cardiovascular Status Stable, Respiratory Function Stable, Patent Airway and No signs of Nausea or vomiting  Last Vitals:  Filed Vitals:   03/20/14 1115  BP:   Pulse: 94  Temp:   Resp: 13    Post-op Vital Signs: stable   Complications: No apparent anesthesia complications

## 2014-03-22 NOTE — Procedures (Signed)
  Radiation Oncology         [phone] ________________________________  Name: Peter Camacho MRN: [id number]  Date: 03/22/2014  DOB: 1960/04/07       Prostate Seed Implant  XL:KGMW, Rollene Rotunda.  No ref. provider found  DIAGNOSIS: 54 y.o. gentleman with stage T1c adenocarcinoma of the prostate with a Gleason's score of 3+4 and a PSA of 12.79    ICD-9-CM ICD-10-CM   1. Prostate cancer 55 C61 DG Chest 2 View     DG Chest 2 View     Discharge patient    PROCEDURE: Insertion of radioactive I-125 seeds into the prostate gland.  RADIATION DOSE: 145 Gy, definitive therapy.  TECHNIQUE: [name] was brought to the operating room with the urologist. He was placed in the dorsolithotomy position. He was catheterized and a rectal tube was inserted. The perineum was shaved, prepped and draped. The ultrasound probe was then introduced into the rectum to see the prostate gland.  TREATMENT DEVICE: A needle grid was attached to the ultrasound probe stand and anchor needles were placed.  3D PLANNING: The prostate was imaged in 3D using a sagittal sweep of the prostate probe. These images were transferred to the planning computer. There, the prostate, urethra and rectum were defined on each axial reconstructed image. Then, the software created an optimized 3D plan and a few seed positions were adjusted. The quality of the plan was reviewed using [facility] information for the target and the following two organs at risk:  Urethra and Rectum.  Then the accepted plan was uploaded to the seed Selectron afterloading unit.  PROSTATE VOLUME STUDY:  Using transrectal ultrasound the volume of the prostate was verified to be 45 cc.  SPECIAL TREATMENT PROCEDURE/SUPERVISION AND HANDLING: The Nucletron FIRST system was used to place the needles under sagittal guidance. A total of 26 needles were used to deposit 71 seeds in the prostate gland. The individual seed activity was 0.498 mCi for a total implant activity of  35.36 mCi.  COMPLEX SIMULATION: At the end of the procedure, an anterior radiograph of the pelvis was obtained to document seed positioning and count. Cystoscopy was performed to check the urethra and bladder.  MICRODOSIMETRY: At the end of the procedure, the patient was emitting 0.048 mrem/hr at 1 meter. Accordingly, he was considered safe for hospital discharge.  PLAN: The patient will return to the radiation oncology clinic for post implant CT dosimetry in three weeks.   ________________________________  Sheral Apley Tammi Klippel, M.D.

## 2014-03-23 ENCOUNTER — Encounter (HOSPITAL_BASED_OUTPATIENT_CLINIC_OR_DEPARTMENT_OTHER): Payer: Self-pay | Admitting: Urology

## 2014-04-09 ENCOUNTER — Telehealth: Payer: Self-pay | Admitting: *Deleted

## 2014-04-09 NOTE — Telephone Encounter (Signed)
CALLED PATIENT TO REMIND OF APPTS. FOR 04-10-14, SPOKE WITH PATIENT AND HE IS AWARE OF THESE APPTS.

## 2014-04-10 ENCOUNTER — Encounter: Payer: Self-pay | Admitting: Radiation Oncology

## 2014-04-10 ENCOUNTER — Ambulatory Visit
Admission: RE | Admit: 2014-04-10 | Discharge: 2014-04-10 | Disposition: A | Discharge status: Home / Self Care | Payer: 59 | Source: Ambulatory Visit | Attending: Radiation Oncology | Admitting: Radiation Oncology

## 2014-04-10 VITALS — BP 128/86 | HR 107 | Resp 16 | Wt 324.9 lb

## 2014-04-10 DIAGNOSIS — C61 Malignant neoplasm of prostate: Secondary | ICD-10-CM

## 2014-04-10 NOTE — Progress Notes (Signed)
Radiation Oncology         [phone] ________________________________  Name: Peter Camacho MRN: [id number]  Date: 04/10/2014  DOB: 1960/05/11  Follow-Up Visit Note  CC: Peter Camacho.  Claybon Jabs, MD  Diagnosis:   54 y.o. gentleman with stage T1c adenocarcinoma of the prostate with a Gleason's score of 3+4 and a PSA of 12.79     ICD-9-CM ICD-10-CM   1. Prostate Cancer with a Gleason's Score of 3+4 and a PSA of 12.79 185 C61     Interval Since Last Radiation:  3  weeks  Narrative:  The patient returns today for routine follow-up.  He is complaining of increased urinary frequency and urinary hesitation symptoms. He filled out a questionnaire regarding urinary function today providing and overall IPSS score of 7 characterizing his symptoms as mild.  His pre-implant score was 3. He denies any bowel symptoms.  ALLERGIES:  is allergic to fish-derived products.  Meds: Current Outpatient Prescriptions  Medication Sig Dispense Refill  . amLODipine (NORVASC) 10 MG tablet TAKE 1 TABLET BY MOUTH EVERY DAY    . losartan-hydrochlorothiazide (HYZAAR) 100-25 MG per tablet Take 1 tablet by mouth every morning.     . Multiple Vitamins-Minerals (GNP MEGA MULTI FOR MEN) TABS Take 1 capsule by mouth daily.    . vitamin C (ASCORBIC ACID) 500 MG tablet Take 500 mg by mouth daily.    Marland Kitchen HYDROcodone-acetaminophen (NORCO) 10-325 MG per tablet Take 1-2 tablets by mouth every 4 (four) hours as needed for moderate pain. Maximum dose per 24 hours - 8 pills (Patient not taking: Reported on 04/10/2014) 30 tablet 0   No current facility-administered medications for this encounter.    Physical Findings: The patient is in no acute distress. Patient is alert and oriented.  weight is 324 lb 14.4 oz (147.374 kg). His blood pressure is 128/86 and his pulse is 107. His respiration is 16. .  No significant changes.  Lab Findings: Lab Results  Component Value Date   WBC 9.2 03/13/2014   HGB 14.5 03/13/2014   HCT 43.2 03/13/2014   MCV 90.2 03/13/2014   PLT 283 03/13/2014    Radiographic Findings:  Patient underwent CT imaging in our clinic for post implant dosimetry. The CT appears to demonstrate an adequate distribution of radioactive seeds throughout the prostate gland. There no seeds in her near the rectum. I suspect the final radiation plan and dosimetry will show appropriate coverage of the prostate gland.   Impression: The patient is recovering from the effects of radiation. His urinary symptoms should gradually improve over the next 4-6 months. We talked about this today. He is encouraged by his improvement already and is otherwise please with his outcome.   Plan: Today, I spent time talking to the patient about his prostate seed implant and resolving urinary symptoms. We also talked about long-term follow-up for prostate cancer following seed implant. He understands that ongoing PSA determinations and digital rectal exams will help perform surveillance to rule out disease recurrence. He understands what to expect with his PSA measures. Patient was also educated today about some of the long-term effects from radiation including a small risk for rectal bleeding and possibly erectile dysfunction. We talked about some of the general management approaches to these potential complications. However, I did encourage the patient to contact our office or return at any point if he has questions or concerns related to his previous radiation and prostate cancer.  _____________________________________  Sheral Apley. Tammi Klippel, M.D.

## 2014-04-10 NOTE — Progress Notes (Signed)
  Radiation Oncology         [phone] ________________________________  Name: Peter Camacho MRN: [id number]  Date: 04/10/2014  DOB: March 10, 1960  COMPLEX SIMULATION NOTE  NARRATIVE:  The patient was brought to the [facility] today following prostate seed implantation approximately one month ago.  Identity was confirmed.  All relevant records and images related to the planned course of therapy were reviewed.  Then, the patient was set-up supine.  CT images were obtained.  The CT images were loaded into the planning software.  Then the prostate and rectum were contoured.  Treatment planning then occurred.  The implanted iodine 125 seeds were identified by the physics staff for projection of radiation distribution  I have requested : 3D Simulation  I have requested a DVH of the following structures: Prostate and rectum.    ________________________________  Sheral Apley Tammi Klippel, M.D.

## 2014-04-10 NOTE — Progress Notes (Signed)
Reports nocturia x2. Reports only occasional difficulty emptying. Weight and vitals stable. Denies dysuria or hematuria. Reports intermittent weak stream. Reports less than half he has to strain to void.

## 2014-04-14 ENCOUNTER — Telehealth: Payer: Self-pay | Admitting: Radiation Oncology

## 2014-04-14 NOTE — Telephone Encounter (Signed)
Patient requested this Probation officer fax a copy of all physician notes related to prostate treatment to Ellin Goodie who works at his employer so [employer] reimbursement can be arranged.

## 2014-05-25 ENCOUNTER — Ambulatory Visit
Admission: RE | Admit: 2014-05-25 | Discharge: 2014-05-25 | Disposition: A | Discharge status: Home / Self Care | Payer: 59 | Source: Ambulatory Visit | Attending: Radiation Oncology | Admitting: Radiation Oncology

## 2014-05-25 ENCOUNTER — Encounter: Payer: Self-pay | Admitting: Radiation Oncology

## 2014-05-25 DIAGNOSIS — C61 Malignant neoplasm of prostate: Secondary | ICD-10-CM | POA: Diagnosis not present

## 2014-05-25 DIAGNOSIS — Z510 Encounter for antineoplastic radiation therapy: Secondary | ICD-10-CM | POA: Diagnosis not present

## 2014-08-01 NOTE — Progress Notes (Signed)
  Radiation Oncology         [phone] ________________________________  Name: [name] MRN: [id number]  Date: 05/25/2014  DOB: 03-May-1960  3D Planning Note   Prostate Brachytherapy Post-Implant Dosimetry  Diagnosis: 54 y.o. gentleman with stage T1c adenocarcinoma of the prostate with a Gleason's score of 3+4 and a PSA of 12.79  Narrative: On a previous date, Peter Camacho returned following prostate seed implantation for post implant planning. He underwent CT scan complex simulation to delineate the three-dimensional structures of the pelvis and demonstrate the radiation distribution.  Since that time, the seed localization, and complex isodose planning with dose volume histograms have now been completed.  Results:   Prostate Coverage - The dose of radiation delivered to the 90% or more of the prostate gland (D90) was 109.25% of the prescription dose. This exceeds our goal of greater than 90%. Rectal Sparing - The volume of rectal tissue receiving the prescription dose or higher was 0.0 cc. This falls under our threshold tolerance of 1.0 cc.  Impression: The prostate seed implant appears to show adequate target coverage and appropriate rectal sparing.  Plan:  The patient will continue to follow with urology for ongoing PSA determinations. I would anticipate a high likelihood for local tumor control with minimal risk for rectal morbidity.  ________________________________  Sheral Apley Tammi Klippel, M.D.

## 2015-11-11 ENCOUNTER — Other Ambulatory Visit: Payer: Self-pay | Admitting: Orthopedic Surgery

## 2015-11-11 DIAGNOSIS — M25561 Pain in right knee: Secondary | ICD-10-CM

## 2015-11-11 DIAGNOSIS — R531 Weakness: Secondary | ICD-10-CM

## 2015-11-11 DIAGNOSIS — M2391 Unspecified internal derangement of right knee: Secondary | ICD-10-CM

## 2015-11-20 ENCOUNTER — Ambulatory Visit
Admission: RE | Admit: 2015-11-20 | Discharge: 2015-11-20 | Disposition: A | Discharge status: Home / Self Care | Payer: 59 | Source: Ambulatory Visit | Attending: Orthopedic Surgery | Admitting: Orthopedic Surgery

## 2015-11-20 DIAGNOSIS — R531 Weakness: Secondary | ICD-10-CM

## 2015-11-20 DIAGNOSIS — M2391 Unspecified internal derangement of right knee: Secondary | ICD-10-CM

## 2015-11-20 DIAGNOSIS — M25561 Pain in right knee: Secondary | ICD-10-CM

## 2016-01-28 LAB — PSA

## 2016-03-09 DIAGNOSIS — Z1159 Encounter for screening for other viral diseases: Secondary | ICD-10-CM | POA: Diagnosis not present

## 2016-03-09 DIAGNOSIS — J9801 Acute bronchospasm: Secondary | ICD-10-CM | POA: Diagnosis not present

## 2016-03-09 DIAGNOSIS — I10 Essential (primary) hypertension: Secondary | ICD-10-CM | POA: Diagnosis not present

## 2016-03-09 DIAGNOSIS — C61 Malignant neoplasm of prostate: Secondary | ICD-10-CM | POA: Diagnosis not present

## 2016-06-20 DIAGNOSIS — J3089 Other allergic rhinitis: Secondary | ICD-10-CM | POA: Diagnosis not present

## 2016-06-20 DIAGNOSIS — J9801 Acute bronchospasm: Secondary | ICD-10-CM | POA: Diagnosis not present

## 2016-06-20 DIAGNOSIS — J441 Chronic obstructive pulmonary disease with (acute) exacerbation: Secondary | ICD-10-CM | POA: Diagnosis not present

## 2016-11-29 ENCOUNTER — Observation Stay (HOSPITAL_BASED_OUTPATIENT_CLINIC_OR_DEPARTMENT_OTHER)
Admission: EM | Admit: 2016-11-29 | Discharge: 2016-12-01 | DRG: 871 | Disposition: A | Discharge status: Home / Self Care | Payer: 59 | Attending: Internal Medicine | Admitting: Internal Medicine

## 2016-11-29 ENCOUNTER — Emergency Department (HOSPITAL_BASED_OUTPATIENT_CLINIC_OR_DEPARTMENT_OTHER): Payer: 59

## 2016-11-29 ENCOUNTER — Encounter (HOSPITAL_BASED_OUTPATIENT_CLINIC_OR_DEPARTMENT_OTHER): Payer: Self-pay | Admitting: *Deleted

## 2016-11-29 DIAGNOSIS — Z8546 Personal history of malignant neoplasm of prostate: Secondary | ICD-10-CM | POA: Diagnosis not present

## 2016-11-29 DIAGNOSIS — I10 Essential (primary) hypertension: Secondary | ICD-10-CM | POA: Diagnosis not present

## 2016-11-29 DIAGNOSIS — J439 Emphysema, unspecified: Secondary | ICD-10-CM | POA: Diagnosis not present

## 2016-11-29 DIAGNOSIS — A419 Sepsis, unspecified organism: Principal | ICD-10-CM | POA: Diagnosis present

## 2016-11-29 DIAGNOSIS — F17210 Nicotine dependence, cigarettes, uncomplicated: Secondary | ICD-10-CM | POA: Diagnosis not present

## 2016-11-29 DIAGNOSIS — R3129 Other microscopic hematuria: Secondary | ICD-10-CM | POA: Diagnosis not present

## 2016-11-29 DIAGNOSIS — Z6841 Body Mass Index (BMI) 40.0 and over, adult: Secondary | ICD-10-CM

## 2016-11-29 DIAGNOSIS — Z23 Encounter for immunization: Secondary | ICD-10-CM | POA: Diagnosis not present

## 2016-11-29 DIAGNOSIS — Z923 Personal history of irradiation: Secondary | ICD-10-CM

## 2016-11-29 DIAGNOSIS — Z8249 Family history of ischemic heart disease and other diseases of the circulatory system: Secondary | ICD-10-CM | POA: Diagnosis not present

## 2016-11-29 DIAGNOSIS — R05 Cough: Secondary | ICD-10-CM | POA: Diagnosis not present

## 2016-11-29 DIAGNOSIS — R000 Tachycardia, unspecified: Secondary | ICD-10-CM | POA: Diagnosis present

## 2016-11-29 DIAGNOSIS — N179 Acute kidney failure, unspecified: Secondary | ICD-10-CM | POA: Diagnosis not present

## 2016-11-29 DIAGNOSIS — J189 Pneumonia, unspecified organism: Secondary | ICD-10-CM | POA: Diagnosis not present

## 2016-11-29 DIAGNOSIS — E871 Hypo-osmolality and hyponatremia: Secondary | ICD-10-CM | POA: Diagnosis not present

## 2016-11-29 DIAGNOSIS — R0902 Hypoxemia: Secondary | ICD-10-CM | POA: Diagnosis not present

## 2016-11-29 DIAGNOSIS — J181 Lobar pneumonia, unspecified organism: Secondary | ICD-10-CM | POA: Diagnosis present

## 2016-11-29 DIAGNOSIS — F17200 Nicotine dependence, unspecified, uncomplicated: Secondary | ICD-10-CM | POA: Diagnosis present

## 2016-11-29 DIAGNOSIS — R350 Frequency of micturition: Secondary | ICD-10-CM | POA: Diagnosis present

## 2016-11-29 DIAGNOSIS — Z833 Family history of diabetes mellitus: Secondary | ICD-10-CM | POA: Diagnosis not present

## 2016-11-29 DIAGNOSIS — R0602 Shortness of breath: Secondary | ICD-10-CM | POA: Diagnosis not present

## 2016-11-29 LAB — I-STAT CG4 LACTIC ACID, ED: Lactic Acid, Venous: 1.41 mmol/L (ref 0.5–1.9)

## 2016-11-29 LAB — URINALYSIS, MICROSCOPIC (REFLEX)

## 2016-11-29 LAB — PROCALCITONIN: Procalcitonin: 0.79 ng/mL

## 2016-11-29 LAB — CBC WITH DIFFERENTIAL/PLATELET
Basophils Absolute: 0 10*3/uL (ref 0.0–0.1)
Basophils Relative: 0 %
Eosinophils Absolute: 0 10*3/uL (ref 0.0–0.7)
Eosinophils Relative: 0 %
HCT: 42.3 % (ref 39.0–52.0)
HEMOGLOBIN: 14.5 g/dL (ref 13.0–17.0)
LYMPHS ABS: 0.9 10*3/uL (ref 0.7–4.0)
Lymphocytes Relative: 6 %
MCH: 30.2 pg (ref 26.0–34.0)
MCHC: 34.3 g/dL (ref 30.0–36.0)
MCV: 88.1 fL (ref 78.0–100.0)
MONO ABS: 1.6 10*3/uL — AB (ref 0.1–1.0)
MONOS PCT: 11 %
NEUTROS PCT: 83 %
Neutro Abs: 12.4 10*3/uL — ABNORMAL HIGH (ref 1.7–7.7)
Platelets: 259 10*3/uL (ref 150–400)
RBC: 4.8 MIL/uL (ref 4.22–5.81)
RDW: 14.8 % (ref 11.5–15.5)
WBC: 14.9 10*3/uL — ABNORMAL HIGH (ref 4.0–10.5)

## 2016-11-29 LAB — COMPREHENSIVE METABOLIC PANEL
ALT: 18 U/L (ref 17–63)
AST: 21 U/L (ref 15–41)
Albumin: 3.4 g/dL — ABNORMAL LOW (ref 3.5–5.0)
Alkaline Phosphatase: 93 U/L (ref 38–126)
Anion gap: 9 (ref 5–15)
BUN: 14 mg/dL (ref 6–20)
CHLORIDE: 93 mmol/L — AB (ref 101–111)
CO2: 28 mmol/L (ref 22–32)
CREATININE: 1.31 mg/dL — AB (ref 0.61–1.24)
Calcium: 8.7 mg/dL — ABNORMAL LOW (ref 8.9–10.3)
GFR, EST NON AFRICAN AMERICAN: 59 mL/min — AB (ref >60)
GLUCOSE: 120 mg/dL — AB (ref 65–99)
Potassium: 3.7 mmol/L (ref 3.5–5.1)
Sodium: 130 mmol/L — ABNORMAL LOW (ref 135–145)
Total Bilirubin: 0.5 mg/dL (ref 0.3–1.2)
Total Protein: 8.6 g/dL — ABNORMAL HIGH (ref 6.5–8.1)

## 2016-11-29 LAB — URINALYSIS, ROUTINE W REFLEX MICROSCOPIC
GLUCOSE, UA: NEGATIVE mg/dL
Ketones, ur: 15 mg/dL — AB
Leukocytes, UA: NEGATIVE
Nitrite: NEGATIVE
PH: 6 (ref 5.0–8.0)
Protein, ur: 100 mg/dL — AB
SPECIFIC GRAVITY, URINE: 1.02 (ref 1.005–1.030)

## 2016-11-29 LAB — PROTIME-INR
INR: 1.17
Prothrombin Time: 14.8 seconds (ref 11.4–15.2)

## 2016-11-29 LAB — D-DIMER, QUANTITATIVE: D-Dimer, Quant: 2.41 ug/mL-FEU — ABNORMAL HIGH (ref 0.00–0.50)

## 2016-11-29 MED ORDER — DEXTROSE 5 % IV SOLN
2.0000 g | INTRAVENOUS | Status: DC
  Administered 2016-11-30 – 2016-12-01 (×2): 2 g via INTRAVENOUS
  Filled 2016-11-29 (×2): qty 2

## 2016-11-29 MED ORDER — HYDROCHLOROTHIAZIDE 25 MG PO TABS
25.0000 mg | ORAL_TABLET | Freq: Every day | ORAL | Status: DC
  Administered 2016-11-30 – 2016-12-01 (×2): 25 mg via ORAL
  Filled 2016-11-29 (×2): qty 1

## 2016-11-29 MED ORDER — INFLUENZA VAC SPLIT QUAD 0.5 ML IM SUSY
0.5000 mL | PREFILLED_SYRINGE | INTRAMUSCULAR | Status: AC
  Administered 2016-12-01: 0.5 mL via INTRAMUSCULAR

## 2016-11-29 MED ORDER — ENOXAPARIN SODIUM 80 MG/0.8ML ~~LOC~~ SOLN
70.0000 mg | SUBCUTANEOUS | Status: DC
  Administered 2016-11-29: 70 mg via SUBCUTANEOUS
  Filled 2016-11-29: qty 0.8

## 2016-11-29 MED ORDER — LOSARTAN POTASSIUM-HCTZ 100-25 MG PO TABS: 1.0000 | ORAL_TABLET | Freq: Every morning | ORAL | Status: DC

## 2016-11-29 MED ORDER — DEXTROSE 5 % IV SOLN
1.0000 g | Freq: Once | INTRAVENOUS | Status: AC
  Administered 2016-11-29: 1 g via INTRAVENOUS
  Filled 2016-11-29: qty 10

## 2016-11-29 MED ORDER — IPRATROPIUM-ALBUTEROL 0.5-2.5 (3) MG/3ML IN SOLN
RESPIRATORY_TRACT | Status: AC
  Filled 2016-11-29: qty 3

## 2016-11-29 MED ORDER — SODIUM CHLORIDE 0.9 % IV SOLN
INTRAVENOUS | Status: DC
  Administered 2016-11-29 – 2016-12-01 (×3): via INTRAVENOUS

## 2016-11-29 MED ORDER — ONDANSETRON HCL 4 MG/2ML IJ SOLN: 4.0000 mg | Freq: Four times a day (QID) | INTRAMUSCULAR | Status: DC | PRN

## 2016-11-29 MED ORDER — IOPAMIDOL (ISOVUE-370) INJECTION 76%
100.0000 mL | Freq: Once | INTRAVENOUS | Status: AC | PRN
  Administered 2016-11-29: 100 mL via INTRAVENOUS

## 2016-11-29 MED ORDER — ORAL CARE MOUTH RINSE
15.0000 mL | Freq: Two times a day (BID) | OROMUCOSAL | Status: DC
  Administered 2016-11-30 – 2016-12-01 (×2): 15 mL via OROMUCOSAL

## 2016-11-29 MED ORDER — DEXTROSE 5 % IV SOLN
500.0000 mg | INTRAVENOUS | Status: DC
  Administered 2016-11-30: 500 mg via INTRAVENOUS
  Filled 2016-11-29 (×3): qty 500

## 2016-11-29 MED ORDER — LOSARTAN POTASSIUM 50 MG PO TABS
100.0000 mg | ORAL_TABLET | Freq: Every day | ORAL | Status: DC
  Administered 2016-11-30 – 2016-12-01 (×2): 100 mg via ORAL
  Filled 2016-11-29 (×2): qty 2

## 2016-11-29 MED ORDER — MONTELUKAST SODIUM 10 MG PO TABS
10.0000 mg | ORAL_TABLET | Freq: Every day | ORAL | Status: DC
  Administered 2016-11-29 – 2016-11-30 (×2): 10 mg via ORAL
  Filled 2016-11-29 (×3): qty 1

## 2016-11-29 MED ORDER — ACETAMINOPHEN 325 MG PO TABS
650.0000 mg | ORAL_TABLET | Freq: Four times a day (QID) | ORAL | Status: DC | PRN
  Administered 2016-11-29 – 2016-11-30 (×2): 650 mg via ORAL
  Filled 2016-11-29 (×2): qty 2

## 2016-11-29 MED ORDER — AZITHROMYCIN 500 MG IV SOLR
INTRAVENOUS | Status: AC
  Filled 2016-11-29: qty 500

## 2016-11-29 MED ORDER — SODIUM CHLORIDE 0.9 % IV BOLUS (SEPSIS)
1000.0000 mL | Freq: Once | INTRAVENOUS | Status: AC
  Administered 2016-11-29: 1000 mL via INTRAVENOUS

## 2016-11-29 MED ORDER — IPRATROPIUM-ALBUTEROL 0.5-2.5 (3) MG/3ML IN SOLN
3.0000 mL | Freq: Once | RESPIRATORY_TRACT | Status: AC
  Administered 2016-11-29: 3 mL via RESPIRATORY_TRACT
  Filled 2016-11-29: qty 3

## 2016-11-29 MED ORDER — ACETAMINOPHEN 325 MG PO TABS
650.0000 mg | ORAL_TABLET | Freq: Once | ORAL | Status: AC
  Administered 2016-11-29: 650 mg via ORAL

## 2016-11-29 MED ORDER — DEXTROSE 5 % IV SOLN
500.0000 mg | Freq: Once | INTRAVENOUS | Status: AC
  Administered 2016-11-29: 500 mg via INTRAVENOUS

## 2016-11-29 MED ORDER — IPRATROPIUM-ALBUTEROL 0.5-2.5 (3) MG/3ML IN SOLN
3.0000 mL | Freq: Once | RESPIRATORY_TRACT | Status: AC
  Administered 2016-11-29: 3 mL via RESPIRATORY_TRACT

## 2016-11-29 MED ORDER — HYDROCHLOROTHIAZIDE 25 MG PO TABS: 25.0000 mg | ORAL_TABLET | Freq: Every day | ORAL | Status: DC

## 2016-11-29 MED ORDER — ACETAMINOPHEN 325 MG PO TABS
ORAL_TABLET | ORAL | Status: AC
  Filled 2016-11-29: qty 2

## 2016-11-29 MED ORDER — LOSARTAN POTASSIUM 50 MG PO TABS: 100.0000 mg | ORAL_TABLET | Freq: Every day | ORAL | Status: DC

## 2016-11-29 NOTE — Progress Notes (Signed)
PHARMACY NOTE -  Rocephin/Zmax  Pharmacy has been assisting with dosing of ceftriaxone and azithromycin for CAP. Dosage remains stable at 2g and 500 mg IV q24 hr, respectively, and need for further dosage adjustment appears unlikely at present.    Will sign off at this time.  Please reconsult if a change in clinical status warrants re-evaluation of dosage.  Reuel Boom, PharmD, BCPS Pager: [phone] 11/29/2016, 8:37 PM

## 2016-11-29 NOTE — ED Provider Notes (Signed)
[place] DEPT [facility] Provider Note   CSN: [id number] Arrival date & time: 11/29/16  1497     History   Chief Complaint Chief Complaint  Patient presents with  . Shortness of Breath    HPI Peter Camacho is a 56 y.o. male with past medical history of prostate cancer, hypertension, presenting to the ED with acute onset of worsening congestion that began on Monday. He reports associated shortness of breath, intermittent mild productive cough, headache, myalgias, and increased urinary frequency. He states he took Advil on Monday with relief of symptoms, however symptoms reappeared. He states every year around this time his allergies act up, and thinks this is the case. Per triage, patient complained of bilateral ear pain, however denying ear pain on this evaluation. He denies history of COPD, asthma. He states he does not currently smoke. Prostate cancer treatment includes radioactive seed implantation that was done 2016. No other recent active chemotherapy or radiation. No history of blood clot or DVT. Denies lower extremity edema, sore throat, abdominal pain, chest pain.  The history is provided by the patient.    Past Medical History:  Diagnosis Date  . At risk for sleep apnea    STOP-BANG= 5    SENT TO PCP 03-17-2014  . History of seizure    HX menigitis x2  in middle and high school - residual seizure's on medications--  last seizure age 69  . Hypertension   . Personal history of meningitis    X2  MIDDLE AND HIGH SCHOOL--  residual seizure's on medication -- last seizure age 74  . Prostate cancer ([place]) MONITORED BY DR OTTELIN   stage T1c, Gleason 3+4, PSA 12.79, vol 38.5cc    Patient Active Problem List   Diagnosis Date Noted  . CAP (community acquired pneumonia) 11/29/2016  . Prostate Cancer with a Gleason's Score of 3+4 and a PSA of 12.79 01/11/2014    Past Surgical History:  Procedure Laterality Date  . PROSTATE BIOPSY    . RADIOACTIVE SEED IMPLANT N/A 03/20/2014   Procedure: RADIOACTIVE SEED IMPLANT;  Surgeon: Claybon Jabs, MD;  Location: [facility];  Service: Urology;  Laterality: N/A;       Home Medications    Prior to Admission medications   Medication Sig Start Date End Date Taking? Authorizing Provider  montelukast (SINGULAIR) 10 MG tablet Take 10 mg by mouth at bedtime.   Yes [provider]  amLODipine (NORVASC) 10 MG tablet TAKE 1 TABLET BY MOUTH EVERY DAY 02/05/14   [provider]  HYDROcodone-acetaminophen (NORCO) 10-325 MG per tablet Take 1-2 tablets by mouth every 4 (four) hours as needed for moderate pain. Maximum dose per 24 hours - 8 pills Patient not taking: Reported on 04/10/2014 03/20/14   Kathie Rhodes, MD  losartan-hydrochlorothiazide (HYZAAR) 100-25 MG per tablet Take 1 tablet by mouth every morning.     [provider]  Multiple Vitamins-Minerals (GNP MEGA MULTI FOR MEN) TABS Take 1 capsule by mouth daily.    [provider]  vitamin C (ASCORBIC ACID) 500 MG tablet Take 500 mg by mouth daily.    [provider]    Family History Family History  Problem Relation Age of Onset  . Cancer Neg Hx     Social History Social History  Substance Use Topics  . Smoking status: Current Some Day Smoker    Packs/day: 0.25    Years: 20.00    Types: Cigarettes  . Smokeless tobacco: Never Used  Comment: 1 PPWK  . Alcohol use No     Allergies   Fish-derived products   Review of Systems Review of Systems  Constitutional: Positive for appetite change. Negative for chills and fever.  HENT: Positive for congestion. Negative for ear pain, sore throat, trouble swallowing and voice change.   Eyes: Negative for visual disturbance.  Respiratory: Positive for cough and shortness of breath.   Cardiovascular: Negative for chest pain.  Gastrointestinal: Negative for abdominal pain, nausea and vomiting.  Genitourinary: Positive for frequency. Negative for dysuria and flank  pain.  Musculoskeletal: Negative for joint swelling, neck pain and neck stiffness.  Neurological: Positive for headaches.  All other systems reviewed and are negative.    Physical Exam Updated Vital Signs BP (!) 155/86 (BP Location: Left Arm)   Pulse (!) 108   Temp (!) 103.1 F (39.5 C)   Resp 18   Ht 6\' 2"  (1.88 m)   Wt (!) 145.2 kg (320 lb)   SpO2 96%   BMI 41.09 kg/m   Physical Exam  Constitutional: He appears well-developed and well-nourished. No distress.  HENT:  Head: Normocephalic and atraumatic.  Mouth/Throat: Oropharynx is clear and moist.  Eyes: Pupils are equal, round, and reactive to light. Conjunctivae and EOM are normal.  Neck: Normal range of motion. Neck supple.  Cardiovascular: Regular rhythm, normal heart sounds and intact distal pulses.  Exam reveals no friction rub.   No murmur heard. Tachycardic  Pulmonary/Chest: Effort normal. No respiratory distress. He has wheezes in the right lower field and the left lower field. He has rhonchi in the right lower field. He has no rales.  Abdominal: Soft. Bowel sounds are normal. There is no tenderness. There is no rebound and no guarding.  Musculoskeletal:  No lower extremity edema or tenderness  Lymphadenopathy:    He has no cervical adenopathy.  Neurological: He is alert.  Skin: Skin is warm.  Psychiatric: He has a normal mood and affect. His behavior is normal.  Nursing note and vitals reviewed.    ED Treatments / Results  Labs (all labs ordered are listed, but only abnormal results are displayed) Labs Reviewed  CBC WITH DIFFERENTIAL/PLATELET - Abnormal; Notable for the following:       Result Value   WBC 14.9 (*)    Neutro Abs 12.4 (*)    Monocytes Absolute 1.6 (*)    All other components within normal limits  D-DIMER, QUANTITATIVE (NOT AT [facility]) - Abnormal; Notable for the following:    D-Dimer, Quant 2.41 (*)    All other components within normal limits  COMPREHENSIVE METABOLIC PANEL - Abnormal;  Notable for the following:    Sodium 130 (*)    Chloride 93 (*)    Glucose, Bld 120 (*)    Creatinine, Ser 1.31 (*)    Calcium 8.7 (*)    Total Protein 8.6 (*)    Albumin 3.4 (*)    GFR calc non Af Amer 59 (*)    All other components within normal limits  URINALYSIS, ROUTINE W REFLEX MICROSCOPIC - Abnormal; Notable for the following:    Color, Urine AMBER (*)    Hgb urine dipstick SMALL (*)    Bilirubin Urine SMALL (*)    Ketones, ur 15 (*)    Protein, ur 100 (*)    All other components within normal limits  URINALYSIS, MICROSCOPIC (REFLEX) - Abnormal; Notable for the following:    Bacteria, UA FEW (*)    Squamous Epithelial / LPF 0-5 (*)  All other components within normal limits  PROTIME-INR  I-STAT CG4 LACTIC ACID, ED    EKG  EKG Interpretation  Date/Time:  Wednesday November 29 2016 09:38:35 EDT Ventricular Rate:  121 PR Interval:    QRS Duration: 91 QT Interval:  331 QTC Calculation: 470 R Axis:   63 Text Interpretation:  Sinus tachycardia Minimal ST depression, inferior leads When comapred to prior, tachycardia now present.  No STEMI Confirmed by Antony Blackbird [phone]) on 11/29/2016 10:49:32 AM       Radiology Dg Chest 2 View  Result Date: 11/29/2016 CLINICAL DATA:  Cough shortness of breath, head congestion, and bilateral ear pain. Current smoker. History of hypertension. EXAM: CHEST  2 VIEW COMPARISON:  Chest x-ray of June 26, 2014 FINDINGS: There is patchy increased density in the anterior right perihilar region. The appearance is masslike on the lateral view. There is a trace of pleural fluid on the left. The right lung is clear. The heart and pulmonary vascularity are normal. There is calcification in the wall of the aortic arch. There is multilevel degenerative disc disease of the thoracic spine. IMPRESSION: New abnormal parenchymal consolidation in the anterior inferior aspect of the left upper lobe. The findings likely reflect pneumonia. Followup PA and  lateral chest X-ray is recommended in 3-4 weeks following trial of antibiotic therapy to ensure resolution and exclude underlying malignancy. Thoracic aortic atherosclerosis. Electronically Signed   By: David  Martinique M.D.   On: 11/29/2016 10:02   Ct Angio Chest Pe W/cm &/or Wo Cm  Result Date: 11/29/2016 CLINICAL DATA:  Cough and congestion with shortness of Breath EXAM: CT ANGIOGRAPHY CHEST WITH CONTRAST TECHNIQUE: Multidetector CT imaging of the chest was performed using the standard protocol during bolus administration of intravenous contrast. Multiplanar CT image reconstructions and MIPs were obtained to evaluate the vascular anatomy. CONTRAST:  100 mL Isovue 370. COMPARISON:  11/29/2016 FINDINGS: Cardiovascular: Atherosclerotic calcifications of the aorta are noted. No aneurysmal dilatation or dissection of the aorta is noted. Mild coronary calcifications are seen. No significant cardiac enlargement is noted. No pericardial effusion is noted. The pulmonary artery shows a normal branching pattern. No filling defect to suggest pulmonary embolism is identified. Mediastinum/Nodes: The thoracic inlet is within normal limits. The hilar and mediastinal structures demonstrate multiple small lymph nodes although these are not significant by size criteria and likely reactive in nature. The esophagus is within normal limits. Lungs/Pleura: Emphysematous changes are noted. A focal infiltrate is identified in the lingula similar to that seen on the prior plain film examination. No pneumothorax or sizable effusion is noted. Upper Abdomen: Within normal limits. Musculoskeletal: No acute bony abnormality is noted. Mild degenerative changes of the thoracic spine are seen. Review of the MIP images confirms the above findings. IMPRESSION: No evidence of pulmonary emboli. Lingular infiltrate consistent with acute pneumonia. Some reactive lymph nodes are identified Aortic Atherosclerosis (ICD10-I70.0) and Emphysema  (ICD10-J43.9). Electronically Signed   By: Inez Catalina M.D.   On: 11/29/2016 11:54    Procedures Procedures (including critical care time)  Medications Ordered in ED Medications  azithromycin (ZITHROMAX) 500 MG injection (not administered)  acetaminophen (TYLENOL) 325 MG tablet (not administered)  ipratropium-albuterol (DUONEB) 0.5-2.5 (3) MG/3ML nebulizer solution 3 mL (3 mLs Nebulization Given 11/29/16 0938)  sodium chloride 0.9 % bolus 1,000 mL (0 mLs Intravenous Stopped 11/29/16 1334)  ipratropium-albuterol (DUONEB) 0.5-2.5 (3) MG/3ML nebulizer solution 3 mL (3 mLs Nebulization Given 11/29/16 1022)  cefTRIAXone (ROCEPHIN) 1 g in dextrose 5 % 50 mL IVPB (  0 g Intravenous Stopped 11/29/16 1126)  azithromycin (ZITHROMAX) 500 mg in dextrose 5 % 250 mL IVPB (0 mg Intravenous Stopped 11/29/16 1334)  iopamidol (ISOVUE-370) 76 % injection 100 mL (100 mLs Intravenous Contrast Given 11/29/16 1122)  sodium chloride 0.9 % bolus 1,000 mL (1,000 mLs Intravenous New Bag/Given 11/29/16 1338)  acetaminophen (TYLENOL) tablet 650 mg (650 mg Oral Given 11/29/16 1514)     Initial Impression / Assessment and Plan / ED Course  I have reviewed the triage vital signs and the nursing notes.  Pertinent labs & imaging results that were available during my care of the patient were reviewed by me and considered in my medical decision making (see chart for details).     Patient presenting with shortness of breath and congestion. On exam, bilateral wheezes with left lobe rhonchi.  Chest x-ray and consistent with pneumonia. Azithromycin and Rocephin given in the ED. Patient O2 sat in the low 90s on room air, and persistently tachycardic after 2 L of IV fluids. D-dimer done due to patient's persistent tachycardia, resulting as elevated. CT chest done, and consistent with pneumonia without evidence of PE. Patient blood pressure remaining stable. O2 saturation improved with 2 L of nasal cannula. Temperature elevated on repeat  check to 103.69F. Tylenol given. Lactic acid within normal limits. Hospitalist consulted , spoke with Dr. Maryland Pink, who is accepting admission to [facility] on telemetry.  Patient discussed with and seen by Dr. Sherry Ruffing.  The patient appears reasonably stabilized for admission considering the current resources, flow, and capabilities available in the ED at this time, and I doubt any other [facility] requiring further screening and/or treatment in the ED prior to admission.  Final Clinical Impressions(s) / ED Diagnoses   Final diagnoses:  Community acquired pneumonia of left lower lobe of lung ([place])  Tachycardia  Hypoxia    New Prescriptions New Prescriptions   No medications on file     Russo, Martinique N, PA-C 11/29/16 1633    Tegeler, Gwenyth Allegra, MD 11/29/16 [phone]

## 2016-11-29 NOTE — H&P (Signed)
History and Physical    Peter Camacho BMW:[id number] DOB: Feb 03, 1961 DOA: 11/29/2016  PCP: None - his PCP resigned last month Consultants:  None Patient coming from: Home - lives alone; NOK: Peter Camacho, [phone]  Chief Complaint: SOB  HPI: Peter Camacho is a 56 y.o. male with medical history significant of HTN and prostate CA treated with seed radiation who presented to [facility] today because "I wasn't feeling good".  He reports that he wasn't breathing good for the last 2 days.  Mild cough, nonproductive.  Slight congestion.  ?fevers, subjective, Monday night.  He was "using the bathroom back to back, urinating, that's when I knew something was wrong".  No sick contacts.  He does not normally have breathing problems, but he does have allergies.  He often wheezes this time of year, this is usually his worst time of the year.   ED Course:  CXR with PNA, given Rocephin and Azithromycin.  O2 sats in 90s on RA, persistent tachycardia after 2L.  Increased D-dimer so CT done without PE but with PNA.  Fever, normal lactate.  Transfer to [facility] for [facility].  Review of Systems: As per HPI; otherwise review of systems reviewed and negative.   Ambulatory Status:  Ambulates without assistance  Past Medical History:  Diagnosis Date  . At risk for sleep apnea    STOP-BANG= 5    SENT TO PCP 03-17-2014  . History of seizure    HX menigitis x2  in middle and high school - residual seizure's on medications--  last seizure age 56  . Hypertension   . Personal history of meningitis    X2  MIDDLE AND HIGH SCHOOL--  residual seizure's on medication -- last seizure age 33  . Prostate cancer ([place]) MONITORED BY DR OTTELIN   stage T1c, Gleason 3+4, PSA 12.79, vol 38.5cc    Past Surgical History:  Procedure Laterality Date  . PROSTATE BIOPSY    . RADIOACTIVE SEED IMPLANT N/A 03/20/2014   Procedure: RADIOACTIVE SEED IMPLANT;  Surgeon: Claybon Jabs, MD;  Location: [facility];  Service: Urology;   Laterality: N/A;    Social History   Social History  . Marital status: Single    Spouse name: N/A  . Number of children: N/A  . Years of education: N/A   Occupational History  . metal heat treating    Social History Main Topics  . Smoking status: Former Smoker    Packs/day: 0.30    Years: 40.00    Types: Cigarettes  . Smokeless tobacco: Never Used     Comment: quit 11/11/16  . Alcohol use No  . Drug use: No  . Sexual activity: Not on file   Other Topics Concern  . Not on file   Social History Narrative  . No narrative on file    Allergies  Allergen Reactions  . Fish-Derived Products Rash    Family History  Problem Relation Age of Onset  . CAD Mother 55  . Diabetes Mother   . CAD Father 7  . Cancer Neg Hx     Prior to Admission medications   Medication Sig Start Date End Date Taking? Authorizing Provider  ibuprofen (ADVIL,MOTRIN) 800 MG tablet Take 800 mg by mouth every 8 (eight) hours as needed for moderate pain.   Yes [provider]  losartan-hydrochlorothiazide (HYZAAR) 100-25 MG per tablet Take 1 tablet by mouth every morning.    Yes [provider]  montelukast (SINGULAIR) 10 MG tablet Take  10 mg by mouth at bedtime.   Yes [provider]    Physical Exam: Vitals:   11/29/16 1600 11/29/16 1630 11/29/16 1822 11/29/16 2122  BP: 105/63 119/78 (!) 148/83 (!) 160/97  Pulse: (!) 109 (!) 102 (!) 104 (!) 121  Resp: 10 15 18 20   Temp:   98.8 F (37.1 C) (!) 102.7 F (39.3 C)  TempSrc:   Oral Oral  SpO2: 99% 98% 95% 97%  Weight:      Height:         General:   Appears calm and comfortable and is NAD Eyes:   EOMI, normal lids, iris ENT:  grossly normal hearing, lips & tongue, mmm Neck:  no LAD, masses or thyromegaly Cardiovascular:  Tachycardia, no m/r/g. No LE edema.  Respiratory: Scattered wheezes, no rhonchi.  Normal respiratory effort. Abdomen:  soft, NT, ND, NABS Back:   normal alignment, no CVAT Skin:  no rash or  induration seen on limited exam Musculoskeletal:  grossly normal tone BUE/BLE, good ROM, no bony abnormality Psychiatric:  grossly normal mood and affect, speech fluent and appropriate, AOx3 Neurologic:  CN 2-12 grossly intact, moves all extremities in coordinated fashion, sensation intact    Radiological Exams on Admission: Dg Chest 2 View  Result Date: 11/29/2016 CLINICAL DATA:  Cough shortness of breath, head congestion, and bilateral ear pain. Current smoker. History of hypertension. EXAM: CHEST  2 VIEW COMPARISON:  Chest x-ray of June 26, 2014 FINDINGS: There is patchy increased density in the anterior right perihilar region. The appearance is masslike on the lateral view. There is a trace of pleural fluid on the left. The right lung is clear. The heart and pulmonary vascularity are normal. There is calcification in the wall of the aortic arch. There is multilevel degenerative disc disease of the thoracic spine. IMPRESSION: New abnormal parenchymal consolidation in the anterior inferior aspect of the left upper lobe. The findings likely reflect pneumonia. Followup PA and lateral chest X-ray is recommended in 3-4 weeks following trial of antibiotic therapy to ensure resolution and exclude underlying malignancy. Thoracic aortic atherosclerosis. Electronically Signed   By: David  Martinique M.D.   On: 11/29/2016 10:02   Ct Angio Chest Pe W/cm &/or Wo Cm  Result Date: 11/29/2016 CLINICAL DATA:  Cough and congestion with shortness of Breath EXAM: CT ANGIOGRAPHY CHEST WITH CONTRAST TECHNIQUE: Multidetector CT imaging of the chest was performed using the standard protocol during bolus administration of intravenous contrast. Multiplanar CT image reconstructions and MIPs were obtained to evaluate the vascular anatomy. CONTRAST:  100 mL Isovue 370. COMPARISON:  11/29/2016 FINDINGS: Cardiovascular: Atherosclerotic calcifications of the aorta are noted. No aneurysmal dilatation or dissection of the aorta is  noted. Mild coronary calcifications are seen. No significant cardiac enlargement is noted. No pericardial effusion is noted. The pulmonary artery shows a normal branching pattern. No filling defect to suggest pulmonary embolism is identified. Mediastinum/Nodes: The thoracic inlet is within normal limits. The hilar and mediastinal structures demonstrate multiple small lymph nodes although these are not significant by size criteria and likely reactive in nature. The esophagus is within normal limits. Lungs/Pleura: Emphysematous changes are noted. A focal infiltrate is identified in the lingula similar to that seen on the prior plain film examination. No pneumothorax or sizable effusion is noted. Upper Abdomen: Within normal limits. Musculoskeletal: No acute bony abnormality is noted. Mild degenerative changes of the thoracic spine are seen. Review of the MIP images confirms the above findings. IMPRESSION: No evidence of pulmonary emboli.  Lingular infiltrate consistent with acute pneumonia. Some reactive lymph nodes are identified Aortic Atherosclerosis (ICD10-I70.0) and Emphysema (ICD10-J43.9). Electronically Signed   By: Inez Catalina M.D.   On: 11/29/2016 11:54    EKG: Independently reviewed.  Sinus tachycardia with rate 121; nonspecific ST changes that are likely rate-related  Labs on Admission: I have personally reviewed the available labs and imaging studies at the time of the admission.  Pertinent labs:   Procalcitonin 0.79 Lactate 1.41 UA: small Hgb, 15 ketones, 100 protein, few bacteria Na++ 130 Glucose 120 Creatinine 1.31, 1.12 in 1/18 WBC 14.9  Assessment/Plan Principal Problem:   Sepsis due to pneumonia ([place]) Active Problems:   CAP (community acquired pneumonia)   Essential hypertension   Tobacco dependence   Sepsis due to PNA -Elevated WBC count, fever, tachycardia with normal lactate -While awaiting blood cultures, this appears to be a preseptic condition. -Sepsis protocol  initiated -Given SOB, fever to 103, and infiltrate in left lower lobe on chest x-ray, most likely community-acquired pneumonia.  -Influenza pending. -CURB-65 score is 0 - mortality rate is 0.7% -Pneumonia Severity Index (PSI) is Class 2, 1% mortality. -He may have underlying COPD given his long-standing h/o tobacco dependence. -Will start Azithromycin 500 mg daily AND Rocephin due to no risk factors for MDR cause. -NS @ 75cc/hr -Fever control -Repeat CBC in am -Sputum cultures -Blood cultures -Strep pneumo testing -Legionella testing - Legionella is a concern in the summer; with elderly patients, smokers, and those with COPD; and in the setting of hyponatremia.  It is effectively treated with Azithromycin. -Will order lower respiratory tract procalcitonin level.  Antibiotics would not be indicated for PCT <0.1 and probably should not be used for < 0.25.  >0.5 indicates infection and >>0.5 indicates more serious disease.  As the procalcitonin level normalizes, it will be reasonable to consider de-escalation of antibiotic coverage. -albuterol PRN -Standing Duonebs -Blood cultures pending -Will admit and continue to monitor -Will add HIV in accordance with CDC screening guidelines  HTN -Continue Hyzaar but monitor creatinine; suspect that his CKD is chronic and generally stable and so continuing his medication seems reasonable for now - but will need to stop if renal function worsens  Tobacco dependence -Encourage cessation.  This was discussed with the patient and should be reviewed on an ongoing basis.  -He reports stopping smoking 2 weeks ago.   DVT prophylaxis:   Lovenox  Code Status:  Full - confirmed with patient/family Family Communication: Cousin present throughout evaluation  Disposition Plan:  Home once clinically improved Consults called: None  Admission status: Admit - It is my clinical opinion that admission to INPATIENT is reasonable and necessary because this patient  will require at least 2 midnights in the hospital to treat this condition based on the medical complexity of the problems presented.  Given the aforementioned information, the predictability of an adverse outcome is felt to be significant.    Karmen Bongo MD [facility]  If note is complete, please contact covering daytime or nighttime physician. [email or web address] Password [email or web address]  11/29/2016, 11:05 PM

## 2016-11-29 NOTE — ED Notes (Signed)
Patient transported to CT 

## 2016-11-29 NOTE — ED Triage Notes (Addendum)
Pt c/o URI symptoms with pro cough, head congestion , SOB  And bil ear pain

## 2016-11-30 DIAGNOSIS — I10 Essential (primary) hypertension: Secondary | ICD-10-CM | POA: Diagnosis not present

## 2016-11-30 DIAGNOSIS — A419 Sepsis, unspecified organism: Secondary | ICD-10-CM | POA: Diagnosis not present

## 2016-11-30 DIAGNOSIS — R000 Tachycardia, unspecified: Secondary | ICD-10-CM | POA: Diagnosis not present

## 2016-11-30 DIAGNOSIS — F17200 Nicotine dependence, unspecified, uncomplicated: Secondary | ICD-10-CM | POA: Diagnosis not present

## 2016-11-30 DIAGNOSIS — J181 Lobar pneumonia, unspecified organism: Secondary | ICD-10-CM | POA: Diagnosis present

## 2016-11-30 DIAGNOSIS — J189 Pneumonia, unspecified organism: Secondary | ICD-10-CM | POA: Diagnosis not present

## 2016-11-30 LAB — BASIC METABOLIC PANEL
ANION GAP: 10 (ref 5–15)
BUN: 10 mg/dL (ref 6–20)
CALCIUM: 8.4 mg/dL — AB (ref 8.9–10.3)
CHLORIDE: 100 mmol/L — AB (ref 101–111)
CO2: 26 mmol/L (ref 22–32)
Creatinine, Ser: 1 mg/dL (ref 0.61–1.24)
GFR calc Af Amer: >60 mL/min (ref >60)
GFR calc non Af Amer: >60 mL/min (ref >60)
GLUCOSE: 118 mg/dL — AB (ref 65–99)
Potassium: 3.6 mmol/L (ref 3.5–5.1)
Sodium: 136 mmol/L (ref 135–145)

## 2016-11-30 LAB — CBC WITH DIFFERENTIAL/PLATELET
BASOS PCT: 0 %
Basophils Absolute: 0 10*3/uL (ref 0.0–0.1)
Eosinophils Absolute: 0 10*3/uL (ref 0.0–0.7)
Eosinophils Relative: 0 %
HEMATOCRIT: 40.9 % (ref 39.0–52.0)
Hemoglobin: 14 g/dL (ref 13.0–17.0)
LYMPHS ABS: 0.7 10*3/uL (ref 0.7–4.0)
LYMPHS PCT: 6 %
MCH: 30.2 pg (ref 26.0–34.0)
MCHC: 34.2 g/dL (ref 30.0–36.0)
MCV: 88.3 fL (ref 78.0–100.0)
MONO ABS: 1.6 10*3/uL — AB (ref 0.1–1.0)
MONOS PCT: 12 %
NEUTROS ABS: 10.3 10*3/uL — AB (ref 1.7–7.7)
Neutrophils Relative %: 82 %
Platelets: 270 10*3/uL (ref 150–400)
RBC: 4.63 MIL/uL (ref 4.22–5.81)
RDW: 14.9 % (ref 11.5–15.5)
WBC: 12.6 10*3/uL — ABNORMAL HIGH (ref 4.0–10.5)

## 2016-11-30 LAB — INFLUENZA PANEL BY PCR (TYPE A & B)
Influenza A By PCR: NEGATIVE
Influenza B By PCR: NEGATIVE

## 2016-11-30 LAB — STREP PNEUMONIAE URINARY ANTIGEN: Strep Pneumo Urinary Antigen: NEGATIVE

## 2016-11-30 LAB — HIV ANTIBODY (ROUTINE TESTING W REFLEX): HIV SCREEN 4TH GENERATION: NONREACTIVE

## 2016-11-30 LAB — EXPECTORATED SPUTUM ASSESSMENT W GRAM STAIN, RFLX TO RESP C: Special Requests: NORMAL

## 2016-11-30 LAB — EXPECTORATED SPUTUM ASSESSMENT W REFEX TO RESP CULTURE

## 2016-11-30 MED ORDER — ALBUTEROL SULFATE (2.5 MG/3ML) 0.083% IN NEBU
2.5000 mg | INHALATION_SOLUTION | Freq: Four times a day (QID) | RESPIRATORY_TRACT | Status: DC
  Administered 2016-11-30 (×4): 2.5 mg via RESPIRATORY_TRACT
  Filled 2016-11-30 (×5): qty 3

## 2016-11-30 MED ORDER — ALBUTEROL SULFATE (2.5 MG/3ML) 0.083% IN NEBU
2.5000 mg | INHALATION_SOLUTION | RESPIRATORY_TRACT | Status: DC | PRN
  Administered 2016-12-01: 2.5 mg via RESPIRATORY_TRACT

## 2016-11-30 NOTE — Progress Notes (Addendum)
Progress Note    Peter Camacho  IZT:[id number] DOB: 1960/04/13  DOA: 11/29/2016 PCP: [name]    Brief Narrative:   Chief complaint: Follow-up pneumonia  Medical records reviewed and are as summarized below:  Peter Camacho is an 56 y.o. male with medical history significant of HTN and prostate CA treated with seed radiation who Was admitted 11/29/16 with a 2 day history of breathing problems, mild cough, and slight congestion. Found to have pneumonia on chest x-ray.  Assessment/Plan:   Principal Problem:   Sepsis due to lobar pneumonia ([place]) Chest x-ray showed a left upper lobe infiltrate. CT showed lobar pneumonia in the lingula. Patient was febrile, tachycardic, evidence of pneumonia on chest radiography consistent with sepsis. Lactate not elevated. CURB-65 score is 0 - mortality rate is 0.7%. Pneumonia Severity Index (PSI) is Class 2, 1% mortality. Continue empiric Rocephin/azithromycin. Follow-up blood cultures, sputum culture, HIV serologies and Legionella testing. Strep pneumonia antigen was negative. Continue bronchodilators.  Active problems: AKI Resolved.  Hematuria in a patient with a PMH of prostate cancer status post radiation seed implant We'll notify his urologist of this. Hemoglobin stable. Stop Lovenox.  HTN Continue Hyzaar but monitor creatinine; suspect that his CKD is chronic and generally stable and so continuing his medication seems reasonable for now - but will need to stop if renal function worsens.  Tobacco dependence Has not smoked in 2 weeks. Cessation encouraged.  Morbid obesity Body mass index is 41.09 kg/m.   Family Communication/Anticipated D/C date and plan/Code Status   DVT prophylaxis: Lovenox discontinued secondary to hematuria. Code Status: Full Code.  Family Communication: Sister updated at bedside. Disposition Plan: Likely home in 24 hours if respiratory status remains stable.   Medical Consultants:     None.   Anti-Infectives:    Rocephin 11/29/16--->  Azithromycin 11/30/16--->  Subjective:   Patient reports hematuria. He reports wheezing and difficulty breathing. Appetite is good. Still febrile to 102.7.  Objective:    Vitals:   11/29/16 1630 11/29/16 1822 11/29/16 2122 11/30/16 0542  BP: 119/78 (!) 148/83 (!) 160/97 124/71  Pulse: (!) 102 (!) 104 (!) 121 (!) 107  Resp: 15 18 20 20   Temp:  98.8 F (37.1 C) (!) 102.7 F (39.3 C) 98.4 F (36.9 C)  TempSrc:  Oral Oral Oral  SpO2: 98% 95% 97% 95%  Weight:      Height:        Intake/Output Summary (Last 24 hours) at 11/30/16 0816 Last data filed at 11/30/16 0542  Gross per 24 hour  Intake             1250 ml  Output              725 ml  Net              525 ml   Filed Weights   11/29/16 0930  Weight: (!) 145.2 kg (320 lb)    Exam: General: No acute distress. Cardiovascular: Heart sounds show a regular rate, and rhythm. No gallops or rubs. No murmurs. No JVD. Lungs: Inspiratory and expiratory wheezes.  Abdomen: Soft, nontender, nondistended with normal active bowel sounds. No masses. No hepatosplenomegaly. Neurological: Alert and oriented 3. Moves all extremities 4 with equal strength. Cranial nerves II through XII grossly intact. Skin: Warm and dry. No rashes or lesions. Extremities: No clubbing or cyanosis. 1+ edema. Pedal pulses 2+. Psychiatric: Mood and affect are normal. Insight and judgment are normal.   Data  Reviewed:   I have personally reviewed following labs and imaging studies:  Labs: Labs show the following:   Basic Metabolic Panel:  Recent Labs Lab 11/29/16 1014 11/30/16 0534  NA 130* 136  K 3.7 3.6  CL 93* 100*  CO2 28 26  GLUCOSE 120* 118*  BUN 14 10  CREATININE 1.31* 1.00  CALCIUM 8.7* 8.4*   GFR Estimated Creatinine Clearance: 123.8 mL/min (by C-G formula based on SCr of 1 mg/dL). Liver Function Tests:  Recent Labs Lab 11/29/16 1014  AST 21  ALT 18  ALKPHOS 93   BILITOT 0.5  PROT 8.6*  ALBUMIN 3.4*   Coagulation profile  Recent Labs Lab 11/29/16 1014  INR 1.17    CBC:  Recent Labs Lab 11/29/16 1014 11/30/16 0534  WBC 14.9* 12.6*  NEUTROABS 12.4* 10.3*  HGB 14.5 14.0  HCT 42.3 40.9  MCV 88.1 88.3  PLT 259 270   D-Dimer:  Recent Labs  11/29/16 1014  DDIMER 2.41*   Sepsis Labs:  Recent Labs Lab 11/29/16 1014 11/29/16 1516 11/29/16 2014 11/30/16 0534  PROCALCITON  --   --  0.79  --   WBC 14.9*  --   --  12.6*  LATICACIDVEN  --  1.41  --   --     Microbiology No results found for this or any previous visit (from the past 240 hour(s)).  Procedures and diagnostic studies:  11/29/16 Dg Chest 2 View: My independent review of the image shows: Left upper lobe versus lingular infiltrate.    11/29/16 Ct Angio Chest Pe W/cm &/or Wo Cm: My independent review of the image shows: Pneumonia in the lingula. No pulmonary embolus.      Medications:   . albuterol  2.5 mg Nebulization QID  . enoxaparin (LOVENOX) injection  70 mg Subcutaneous Q24H  . losartan  100 mg Oral Daily   And  . hydrochlorothiazide  25 mg Oral Daily  . Influenza vac split quadrivalent PF  0.5 mL Intramuscular Tomorrow-1000  . mouth rinse  15 mL Mouth Rinse BID  . montelukast  10 mg Oral QHS   Continuous Infusions: . sodium chloride 75 mL/hr at 11/29/16 2138  . azithromycin    . cefTRIAXone (ROCEPHIN)  IV       LOS: 1 day   Kaleem Sartwell  [facility] Pager [phone]. If unable to reach me by pager, please call my cell phone at [phone].  *Please refer to [email or web address], password [email or web address] to get updated schedule on who will round on this patient, as hospitalists switch teams weekly. If 7PM-7AM, please contact night-coverage at [email or web address], password [email or web address] for any overnight needs.  11/30/2016, 8:16 AM

## 2016-11-30 NOTE — Care Management Note (Signed)
Case Management Note  Patient Details  Name: Peter Camacho MRN: [id number] Date of Birth: [birth year]  Subjective/Objective:                  pna  Action/Plan: Date:  November 30, 2016 Chart reviewed for concurrent status and case management needs.  Will continue to follow patient progress.  Discharge Planning: following for needs  Expected discharge date: [id number]  Velva Harman, BSN, [place], [place]   Expected Discharge Date:                  Expected Discharge Plan:  Home/Self Care  In-House Referral:     Discharge planning Services  CM Consult  Post Acute Care Choice:    Choice offered to:     DME Arranged:    DME Agency:     HH Arranged:    HH Agency:     Status of Service:  In process, will continue to follow  If discussed at Long Length of Stay Meetings, dates discussed:    Additional Comments:  Leeroy Cha, RN 11/30/2016, 10:51 AM

## 2016-11-30 NOTE — Progress Notes (Signed)
Chaplain following due to spiritual care consult re: advance directives.   Provided education around advance directive paperwork.  Peter Camacho called a family member whom he would like to serve in this role.  Chaplain will go over paperwork with Peter Camacho and family member when they are present.     Provided spiritual support and prayers at bedside.    WL / [place], MDiv

## 2016-12-01 DIAGNOSIS — J181 Lobar pneumonia, unspecified organism: Secondary | ICD-10-CM

## 2016-12-01 LAB — CBC
HEMATOCRIT: 38.9 % — AB (ref 39.0–52.0)
HEMOGLOBIN: 13.1 g/dL (ref 13.0–17.0)
MCH: 30.2 pg (ref 26.0–34.0)
MCHC: 33.7 g/dL (ref 30.0–36.0)
MCV: 89.6 fL (ref 78.0–100.0)
PLATELETS: 294 10*3/uL (ref 150–400)
RBC: 4.34 MIL/uL (ref 4.22–5.81)
RDW: 14.7 % (ref 11.5–15.5)
WBC: 8.1 10*3/uL (ref 4.0–10.5)

## 2016-12-01 MED ORDER — AZITHROMYCIN 250 MG PO TABS
500.0000 mg | ORAL_TABLET | Freq: Every day | ORAL | Status: DC
  Administered 2016-12-01: 500 mg via ORAL
  Filled 2016-12-01: qty 2

## 2016-12-01 MED ORDER — AMOXICILLIN-POT CLAVULANATE 875-125 MG PO TABS: 1.0000 | ORAL_TABLET | Freq: Two times a day (BID) | ORAL | 0 refills | Status: AC

## 2016-12-01 MED ORDER — ALBUTEROL SULFATE HFA 108 (90 BASE) MCG/ACT IN AERS: 2.0000 | INHALATION_SPRAY | Freq: Four times a day (QID) | RESPIRATORY_TRACT | 2 refills | Status: DC | PRN

## 2016-12-01 MED ORDER — GUAIFENESIN ER 600 MG PO TB12: 600.0000 mg | ORAL_TABLET | Freq: Two times a day (BID) | ORAL | 2 refills | Status: DC

## 2016-12-01 MED ORDER — MONTELUKAST SODIUM 10 MG PO TABS: 10.0000 mg | ORAL_TABLET | Freq: Every day | ORAL | 0 refills | Status: DC

## 2016-12-01 MED ORDER — ALBUTEROL SULFATE (2.5 MG/3ML) 0.083% IN NEBU: 2.5000 mg | INHALATION_SOLUTION | Freq: Two times a day (BID) | RESPIRATORY_TRACT | Status: DC

## 2016-12-01 MED ORDER — LOSARTAN POTASSIUM-HCTZ 100-25 MG PO TABS: 1.0000 | ORAL_TABLET | Freq: Every morning | ORAL | 0 refills | Status: DC

## 2016-12-01 MED ORDER — ALBUTEROL SULFATE (2.5 MG/3ML) 0.083% IN NEBU: 2.5000 mg | INHALATION_SOLUTION | Freq: Three times a day (TID) | RESPIRATORY_TRACT | Status: DC

## 2016-12-01 NOTE — Progress Notes (Signed)
Discharge instructions and medications discussed with patient and patient's sister (per patient's request).  Prescriptions and AVS given to patient.  All questions answered.

## 2016-12-01 NOTE — Progress Notes (Signed)
SATURATION QUALIFICATIONS: (This note is used to comply with regulatory documentation for home oxygen)  Patient Saturations on Room Air at Rest =98%  Patient Saturations on Room Air while Ambulating = 93%  Patient Saturations on 0 Liters of oxygen while Ambulating = 0%  Please briefly explain why patient needs home oxygen: Not needed

## 2016-12-01 NOTE — Progress Notes (Deleted)
Patient belongings given to mother to take home. Per patient request.

## 2016-12-01 NOTE — Progress Notes (Signed)
Patient states that he had an "inhaler, several bottles of blood pressure medications and pain medications". Patient does not know the specific names of medication. He states that he had them when he arrived at [facility] from [employer]. No record of medications being secured by pharmacy. Checked patient belongings and they are not there. Spoke with Agricultural consultant at [facility] to inquire about patient missing medications. She stated that there was no documentation of patient having medications with him when he arrived to them. Patient made aware. MD aware and prescriptions given for home medication.

## 2016-12-01 NOTE — Progress Notes (Signed)
PHARMACIST - PHYSICIAN COMMUNICATION DR:   Erlinda Hong CONCERNING: Antibiotic IV to Oral Route Change Policy  RECOMMENDATION: This patient is receiving Zithromax by the intravenous route.  Based on criteria approved by the Pharmacy and Therapeutics Committee, the antibiotic(s) is/are being converted to the equivalent oral dose form(s).   DESCRIPTION: These criteria include:  Patient being treated for a respiratory tract infection, urinary tract infection, cellulitis or clostridium difficile associated diarrhea if on metronidazole  The patient is not neutropenic and does not exhibit a GI malabsorption state  The patient is eating (either orally or via tube) and/or has been taking other orally administered medications for a least 24 hours  The patient is improving clinically and has a Tmax < 100.5  If you have questions about this conversion, please contact the Pharmacy Department  []   [phone] )  [facility] []   [phone] )  [facility] []   [phone] )  [facility] []   [phone] )  [facility] [x]   [phone] )  Stevens, PharmD, [place] Pager [phone] 12/01/2016 8:48 AM

## 2016-12-01 NOTE — Progress Notes (Signed)
Follow up with pt around advance directive and continued emotional support.  Family at bedside.  Chaplain provided continued education around advance directives.  Peter Camacho wishes to speak about this with family member.  They will have nursing page chaplain to notarize if they are ready prior to his discharge.  If not, they are able to come back to hospital and contact chaplain for notarization.   WL / [place], MDiv

## 2016-12-01 NOTE — Discharge Summary (Signed)
Discharge Summary  Peter Camacho FYB:[id number] DOB: [birth year]  PCP: [name]  Admit date: 11/29/2016 Discharge date: 12/01/2016  Time spent: <11mins  Recommendations for Outpatient Follow-up:  1. F/u with PMD at [facility]  [phone])within a week  for hospital discharge follow up, repeat cbc/bmp at follow up.  pmd to repeat cxr two view to ensure resolution of pneumonia 2. F/u with pulmonology for lung function test and possible outpatient sleep study  Discharge Diagnoses:  Active Hospital Problems   Diagnosis Date Noted  . Lobar pneumonia ([place]) 11/30/2016  . Sepsis due to pneumonia ([place]) 11/29/2016  . Essential hypertension 11/29/2016  . Tobacco dependence 11/29/2016    Resolved Hospital Problems   Diagnosis Date Noted Date Resolved  . CAP (community acquired pneumonia) 11/29/2016 11/30/2016    Discharge Condition: stable  Diet recommendation: heart healthy  Filed Weights   11/29/16 0930  Weight: (!) 145.2 kg (320 lb)    History of present illness:  (per admitting MD Dr Lorin Mercy) PCP: None - his PCP resigned last month Consultants:  None Patient coming from: Home - lives alone; NOK: [place], [phone]  Chief Complaint: SOB  HPI: Peter Camacho is a 56 y.o. male with medical history significant of HTN and prostate CA treated with seed radiation who presented to [facility] today because "I wasn't feeling good".  He reports that he wasn't breathing good for the last 2 days.  Mild cough, nonproductive.  Slight congestion.  ?fevers, subjective, Monday night.  He was "using the bathroom back to back, urinating, that's when I knew something was wrong".  No sick contacts.  He does not normally have breathing problems, but he does have allergies.  He often wheezes this time of year, this is usually his worst time of the year.   ED Course:  CXR with PNA, given Rocephin and Azithromycin.  O2 sats in 90s on RA, persistent tachycardia after 2L.  Increased D-dimer  so CT done without PE but with PNA.  Fever, normal lactate.  Transfer to [facility] for admission.   Hospital Course:  Principal Problem:   Lobar pneumonia ([place]) Active Problems:   Sepsis due to pneumonia [facility])   Essential hypertension   Tobacco dependence    Sepsis on presentation due to lobar pneumonia ([place]) -he presented with  Fever 103, sinus tachycardia heart rate 118, tachypnea, rr >20, 02 low 90's on room air, leukocytosis, wbc 14.9, Lactate not elevated. -on admission CURB-65 score is 0 - mortality rate is 0.7%. Pneumonia Severity Index (PSI) is Class 2, 1% mortality. -Chest x-ray showed a left upper lobe infiltrate. CT sh, owed lobar pneumonia in the lingula and back ground emphysema.  -influenza negative, blood cultures no growth, sputum culture too young to read at discharge, HIV serologies negative, Legionella testing in process, Strep pneumonia antigen was negative. -he is treated with empiric Rocephin/azithromycin for three days in the hospital, steroids, bronchodilators.  -Fever subsided, he ambulated without hypoxia, he is discharged on augmentin for 4 days to finish total of 7 days treatment, he is to follow up with pmd to repeat cxr in 3-4 weeks.  H/o asthma: also smoker with emphysema changes on ct scan Continue singulair, continue albuterol Pulmonology follow up for lung function test.  Microscopic Hematuria in a patient with a PMH of prostate cancer status post radiation seed implant Hemoglobin stable. No urinary symptom, Stop prophylaxis Lovenox. Follow up with [facility]  AKI on possible CKDII cr 1.3 on admission, cr  improved , cr 1 at discharge He is to follow up with pmd to repeat bmp  HTN -Continue Hyzaar. -pmd to repeat bmp at follow up.  Tobacco dependence Has not smoked in 2 weeks. Cessation encouraged.  Morbid obesity Body mass index is 41.09 kg/m. Outpatient sleep study    Procedures:  none  Consultations:  none  Discharge  Exam: BP (!) 143/79 (BP Location: Right Arm)   Pulse 100   Temp 98.4 F (36.9 C) (Oral)   Resp 18   Ht 6\' 2"  (1.88 m)   Wt (!) 145.2 kg (320 lb)   SpO2 99%   BMI 41.09 kg/m   General: NAD, obese Cardiovascular: RRR Respiratory: intermittent mild anterior wheezing,  Ab: soft/NT/ND,+bowel sounds  Discharge Instructions You were cared for by a hospitalist during your hospital stay. If you have any questions about your discharge medications or the care you received while you were in the hospital after you are discharged, you can call the unit and asked to speak with the hospitalist on call if the hospitalist that took care of you is not available. Once you are discharged, your primary care physician will handle any further medical issues. Please note that NO REFILLS for any discharge medications will be authorized once you are discharged, as it is imperative that you return to your primary care physician (or establish a relationship with a primary care physician if you do not have one) for your aftercare needs so that they can reassess your need for medications and monitor your lab values.  Discharge Instructions    Diet - low sodium heart healthy    Complete by:  As directed    Increase activity slowly    Complete by:  As directed      Allergies as of 12/01/2016      Reactions   Fish-derived Products Rash      Medication List    STOP taking these medications   ibuprofen 800 MG tablet Commonly known as:  ADVIL,MOTRIN     TAKE these medications   albuterol 108 (90 Base) MCG/ACT inhaler Commonly known as:  PROVENTIL HFA;VENTOLIN HFA Inhale 2 puffs into the lungs every 6 (six) hours as needed for wheezing or shortness of breath.   amoxicillin-clavulanate 875-125 MG tablet Commonly known as:  AUGMENTIN Take 1 tablet by mouth 2 (two) times daily.   guaiFENesin 600 MG 12 hr tablet Commonly known as:  MUCINEX Take 1 tablet (600 mg total) by mouth 2 (two) times daily.     losartan-hydrochlorothiazide 100-25 MG tablet Commonly known as:  HYZAAR Take 1 tablet by mouth every morning.   montelukast 10 MG tablet Commonly known as:  SINGULAIR Take 1 tablet (10 mg total) by mouth at bedtime.      Allergies  Allergen Reactions  . Fish-Derived Products Rash   Follow-up Information    [facility] Follow up in 1 month(s).   Specialty:  Pulmonology Why:  outpatient lung function test and consider sleep study Contact information: [place]       Debbrah Alar, NP Follow up in 1 week(s).   Specialty:  Internal Medicine Why:  hospital discharge follow up, repeat cbc/bmp at follow up. primary care physician to repeat cxr in 3-4 weeks. Contact information: [place] [address] [phone]        Kathie Rhodes, MD Follow up in 3 week(s).  Specialty:  Urology Why:  hematuria, h/o prostate cancer Contact information: [place] [address] [phone]            The results of significant diagnostics from this hospitalization (including imaging, microbiology, ancillary and laboratory) are listed below for reference.    Significant Diagnostic Studies: Dg Chest 2 View  Result Date: 11/29/2016 CLINICAL DATA:  Cough shortness of breath, head congestion, and bilateral ear pain. Current smoker. History of hypertension. EXAM: CHEST  2 VIEW COMPARISON:  Chest x-ray of June 26, 2014 FINDINGS: There is patchy increased density in the anterior right perihilar region. The appearance is masslike on the lateral view. There is a trace of pleural fluid on the left. The right lung is clear. The heart and pulmonary vascularity are normal. There is calcification in the wall of the aortic arch. There is multilevel degenerative disc disease of the thoracic spine. IMPRESSION: New abnormal parenchymal consolidation in the  anterior inferior aspect of the left upper lobe. The findings likely reflect pneumonia. Followup PA and lateral chest X-ray is recommended in 3-4 weeks following trial of antibiotic therapy to ensure resolution and exclude underlying malignancy. Thoracic aortic atherosclerosis. Electronically Signed   By: David  Martinique M.D.   On: 11/29/2016 10:02   Ct Angio Chest Pe W/cm &/or Wo Cm  Result Date: 11/29/2016 CLINICAL DATA:  Cough and congestion with shortness of Breath EXAM: CT ANGIOGRAPHY CHEST WITH CONTRAST TECHNIQUE: Multidetector CT imaging of the chest was performed using the standard protocol during bolus administration of intravenous contrast. Multiplanar CT image reconstructions and MIPs were obtained to evaluate the vascular anatomy. CONTRAST:  100 mL Isovue 370. COMPARISON:  11/29/2016 FINDINGS: Cardiovascular: Atherosclerotic calcifications of the aorta are noted. No aneurysmal dilatation or dissection of the aorta is noted. Mild coronary calcifications are seen. No significant cardiac enlargement is noted. No pericardial effusion is noted. The pulmonary artery shows a normal branching pattern. No filling defect to suggest pulmonary embolism is identified. Mediastinum/Nodes: The thoracic inlet is within normal limits. The hilar and mediastinal structures demonstrate multiple small lymph nodes although these are not significant by size criteria and likely reactive in nature. The esophagus is within normal limits. Lungs/Pleura: Emphysematous changes are noted. A focal infiltrate is identified in the lingula similar to that seen on the prior plain film examination. No pneumothorax or sizable effusion is noted. Upper Abdomen: Within normal limits. Musculoskeletal: No acute bony abnormality is noted. Mild degenerative changes of the thoracic spine are seen. Review of the MIP images confirms the above findings. IMPRESSION: No evidence of pulmonary emboli. Lingular infiltrate consistent with acute pneumonia.  Some reactive lymph nodes are identified Aortic Atherosclerosis (ICD10-I70.0) and Emphysema (ICD10-J43.9). Electronically Signed   By: Inez Catalina M.D.   On: 11/29/2016 11:54    Microbiology: Recent Results (from the past 240 hour(s))  Culture, blood (x 2)     Status: None (Preliminary result)   Collection Time: 11/29/16  8:14 PM  Result Value Ref Range Status   Specimen Description BLOOD LEFT ANTECUBITAL  Final   Special Requests IN PEDIATRIC BOTTLE Blood Culture adequate volume  Final   Culture   Final    NO GROWTH < 24 HOURS Performed at [facility], [address], [place], [place] [address]    Report Status PENDING  Incomplete  Culture, blood (x 2)     Status: None (Preliminary result)   Collection Time: 11/29/16  8:35 PM  Result Value Ref Range Status  Specimen Description BLOOD LEFT ANTECUBITAL  Final   Special Requests   Final    BOTTLES DRAWN AEROBIC ONLY Blood Culture adequate volume   Culture   Final    NO GROWTH < 24 HOURS Performed at [facility], [address] [place], [place] [address]    Report Status PENDING  Incomplete  Culture, sputum-assessment     Status: None   Collection Time: 11/30/16 11:18 AM  Result Value Ref Range Status   Specimen Description EXPECTORATED SPUTUM  Final   Special Requests Normal  Final   Sputum evaluation THIS SPECIMEN IS ACCEPTABLE FOR SPUTUM CULTURE  Final   Report Status 11/30/2016 FINAL  Final  Culture, respiratory (NON-Expectorated)     Status: None (Preliminary result)   Collection Time: 11/30/16 11:18 AM  Result Value Ref Range Status   Specimen Description EXPECTORATED SPUTUM  Final   Special Requests Normal Reflexed from [id number]  Final   Gram Stain   Final    ABUNDANT WBC PRESENT,BOTH PMN AND MONONUCLEAR RARE GRAM VARIABLE ROD RARE GRAM POSITIVE COCCI IN PAIRS    Culture   Final    TOO YOUNG TO READ Performed at [facility], [place] [address], [place],  [address]    Report Status PENDING   Incomplete     Labs: Basic Metabolic Panel:  Recent Labs Lab 11/29/16 1014 11/30/16 0534  NA 130* 136  K 3.7 3.6  CL 93* 100*  CO2 28 26  GLUCOSE 120* 118*  BUN 14 10  CREATININE 1.31* 1.00  CALCIUM 8.7* 8.4*   Liver Function Tests:  Recent Labs Lab 11/29/16 1014  AST 21  ALT 18  ALKPHOS 93  BILITOT 0.5  PROT 8.6*  ALBUMIN 3.4*   No results for input(s): LIPASE, AMYLASE in the last 168 hours. No results for input(s): AMMONIA in the last 168 hours. CBC:  Recent Labs Lab 11/29/16 1014 11/30/16 0534 12/01/16 0514  WBC 14.9* 12.6* 8.1  NEUTROABS 12.4* 10.3*  --   HGB 14.5 14.0 13.1  HCT 42.3 40.9 38.9*  MCV 88.1 88.3 89.6  PLT 259 270 294   Cardiac Enzymes: No results for input(s): CKTOTAL, CKMB, CKMBINDEX, TROPONINI in the last 168 hours. BNP: BNP (last 3 results) No results for input(s): BNP in the last 8760 hours.  ProBNP (last 3 results) No results for input(s): PROBNP in the last 8760 hours.  CBG: No results for input(s): GLUCAP in the last 168 hours.     SignedFlorencia Reasons MD, PhD  [facility] 12/01/2016, 3:00 PM

## 2016-12-01 NOTE — Progress Notes (Signed)
December 01, 2016 Chart and discharge orders researched for Case Management needs. None found and patient discharged to appropriate level of care. Patient and family have no further questions. Maxden Naji, BSN, RN3, CCM [phone]. 

## 2016-12-03 LAB — CULTURE, RESPIRATORY: CULTURE: NORMAL

## 2016-12-03 LAB — CULTURE, RESPIRATORY W GRAM STAIN: Special Requests: NORMAL

## 2016-12-04 LAB — CULTURE, BLOOD (ROUTINE X 2)
CULTURE: NO GROWTH
Culture: NO GROWTH
SPECIAL REQUESTS: ADEQUATE
Special Requests: ADEQUATE

## 2016-12-05 ENCOUNTER — Telehealth: Payer: Self-pay | Admitting: Behavioral Health

## 2016-12-05 ENCOUNTER — Encounter: Payer: Self-pay | Admitting: Behavioral Health

## 2016-12-05 NOTE — Telephone Encounter (Signed)
Pre-Visit Call completed with patient and chart updated.   Pre-Visit Info documented in Specialty Comments under [employer].    

## 2016-12-06 ENCOUNTER — Ambulatory Visit: Payer: 59 | Admitting: Family Medicine

## 2016-12-06 DIAGNOSIS — Z0289 Encounter for other administrative examinations: Secondary | ICD-10-CM

## 2016-12-07 ENCOUNTER — Encounter: Payer: Self-pay | Admitting: Family Medicine

## 2016-12-07 ENCOUNTER — Telehealth: Payer: Self-pay | Admitting: Family Medicine

## 2016-12-07 ENCOUNTER — Ambulatory Visit (INDEPENDENT_AMBULATORY_CARE_PROVIDER_SITE_OTHER): Payer: 59 | Admitting: Family Medicine

## 2016-12-07 VITALS — BP 130/92 | HR 95 | Temp 98.2°F | Ht 74.0 in | Wt 307.0 lb

## 2016-12-07 DIAGNOSIS — J189 Pneumonia, unspecified organism: Secondary | ICD-10-CM

## 2016-12-07 DIAGNOSIS — H9192 Unspecified hearing loss, left ear: Secondary | ICD-10-CM | POA: Diagnosis not present

## 2016-12-07 DIAGNOSIS — R0683 Snoring: Secondary | ICD-10-CM | POA: Diagnosis not present

## 2016-12-07 NOTE — Telephone Encounter (Signed)
Pt brought disability claim form physician statement form for Dr Nani Ravens. Placed form in provider tray at front desk area.

## 2016-12-07 NOTE — Patient Instructions (Addendum)
If you do not hear anything about your referrals in the next 1-2 weeks, call our office and ask for an update.  Great work quitting smoking!  Claritin (loratadine), Allegra (fexofenadine), Zyrtec (cetirizine); these are listed in order from weakest to strongest. Generic, and therefore cheaper, options are in the parentheses.   Flonase (fluticasone); nasal spray that is over the counter. 2 sprays each nostril, once daily. Aim towards the same side eye when you spray.  There are available OTC, and the generic versions, which may be cheaper, are in parentheses. Show this to a pharmacist if you have trouble finding any of these items.  Let us know if you need anything.

## 2016-12-07 NOTE — Progress Notes (Signed)
Pre visit review using our clinic review tool, if applicable. No additional management support is needed unless otherwise documented below in the visit note. 

## 2016-12-07 NOTE — Progress Notes (Signed)
Chief Complaint  Patient presents with  . Establish Care        New Patient Visit SUBJECTIVE: HPI: Peter Camacho is an 56 y.o.male who is being seen for establishing care.  The patient was previously seen at Dr Earl Lites office, who has retired.  Pt recently admitted for PNA at [facility]. He was admitted on 10/3 and discharged on 10/5. He improved clinically and is currently taking Augmentin. He denies any fevers, cough, chest pain, short of breath, or upper respiratory symptoms. He would like me to write a letter stating he can return to work on Tuesday.   Patient has a history of right ear hearing loss after 2 episodes of meningitis. He sustained these infections when he was a child. He currently follows with an audiologist as he is starting to lose hearing in his left ear. He used to work in a [employer] with loud noise exposure and not much ear protection in the 1990s. This is when his hearing started to decline. No fullness or injury. He does not follow with an ENT. He is not having any pain. He does wear hearing aid. He is requesting me to fill out disability paperwork for hearing loss.  Allergies  Allergen Reactions  . Fish-Derived Products Rash    Past Medical History:  Diagnosis Date  . At risk for sleep apnea    STOP-BANG= 5    SENT TO PCP 03-17-2014  . Hard of hearing   . History of seizure    HX menigitis x2  in middle and high school - residual seizure's on medications--  last seizure age 17  . Hypertension   . Personal history of meningitis    X2  MIDDLE AND HIGH SCHOOL--  residual seizure's on medication -- last seizure age 30  . Prostate cancer ([place]) MONITORED BY DR OTTELIN   stage T1c, Gleason 3+4, PSA 12.79, vol 38.5cc   Past Surgical History:  Procedure Laterality Date  . PROSTATE BIOPSY    . RADIOACTIVE SEED IMPLANT N/A 03/20/2014   Procedure: RADIOACTIVE SEED IMPLANT;  Surgeon: Claybon Jabs, MD;  Location: [facility];  Service: Urology;   Laterality: N/A;   Social History   Social History  . Marital status: Single   Occupational History  . metal heat treating    Social History Main Topics  . Smoking status: Former Smoker    Packs/day: 0.30    Years: 40.00    Types: Cigarettes  . Smokeless tobacco: Never Used     Comment: quit 11/11/16  . Alcohol use No  . Drug use: No   Family History  Problem Relation Age of Onset  . CAD Mother 56  . Diabetes Mother   . CAD Father 81  . Cancer Neg Hx      Current Outpatient Prescriptions:  .  albuterol (PROVENTIL HFA;VENTOLIN HFA) 108 (90 Base) MCG/ACT inhaler, Inhale 2 puffs into the lungs every 6 (six) hours as needed for wheezing or shortness of breath., Disp: 1 Inhaler, Rfl: 2 .  amoxicillin-clavulanate (AUGMENTIN) 875-125 MG tablet, Take 1 tablet by mouth 2 (two) times daily., Disp: , Rfl:  .  losartan-hydrochlorothiazide (HYZAAR) 100-25 MG tablet, Take 1 tablet by mouth every morning., Disp: 30 tablet, Rfl: 0 .  montelukast (SINGULAIR) 10 MG tablet, Take 1 tablet (10 mg total) by mouth at bedtime., Disp: 30 tablet, Rfl: 0  ROS 10 pt ROS neg unless noted in HPI  OBJECTIVE: BP (!) 130/92 (BP Location: Left Arm,  Patient Position: Sitting, Cuff Size: Large)   Pulse 95   Temp 98.2 F (36.8 C) (Oral)   Ht 6\' 2"  (1.88 m)   Wt (!) 307 lb (139.3 kg)   SpO2 97%   BMI 39.42 kg/m   Constitutional: -  VS reviewed -  Well developed, well nourished, appears stated age -  No apparent distress  Psychiatric: -  Oriented to person, place, and time -  Memory intact -  Affect and mood normal -  Fluent conversation, good eye contact -  Judgment and insight age appropriate  Eye: -  Conjunctivae clear, no discharge -  Pupils symmetric, round, reactive to light  ENMT: -  MMM    Pharynx moist, no exudate, no erythema -  HOH, ears neg b/l -  Nares patent, no d/c  Neck: -  No gross swelling, no palpable masses -  Thyroid midline, not enlarged, mobile, no palpable masses   Cardiovascular: -  RRR -  No LE edema  Respiratory: -  Normal respiratory effort, no accessory muscle use, no retraction -  Breath sounds equal, no wheezes, no ronchi, no crackles  Gastrointestinal: -  Bowel sounds normal -  No tenderness, no distention, no guarding, no masses  Neurological:  -  CN II - XII grossly intact -  Sensation grossly intact to light touch, equal bilaterally  Musculoskeletal: -  No clubbing, no cyanosis -  Gait normal  Skin: -  No significant lesion on inspection -  Warm and dry to palpation   ASSESSMENT/PLAN: Hearing loss of left ear, unspecified hearing loss type - Plan: Ambulatory referral to ENT  Pneumonia due to infectious organism, unspecified laterality, unspecified part of lung - Plan: CBC, Basic metabolic panel  Snoring - Plan: Ambulatory referral to Pulmonology  Patient instructed to sign release of records form from his previous PCP. Refer to ENT for further evaluation of L hearing loss to make sure there are no other treatable underlying causes.  D/C summary reviewed. Will follow up on labs. Also rec'd that he be evaluated for OSA, will refer to pulm.  Patient should return in 3 mo for med check. The patient voiced understanding and agreement to the plan.   [place], DO 12/07/16  4:35 PM

## 2016-12-08 ENCOUNTER — Other Ambulatory Visit: Payer: Self-pay | Admitting: Family Medicine

## 2016-12-08 DIAGNOSIS — Z862 Personal history of diseases of the blood and blood-forming organs and certain disorders involving the immune mechanism: Secondary | ICD-10-CM

## 2016-12-08 LAB — CBC
HEMATOCRIT: 42.4 % (ref 39.0–52.0)
HEMOGLOBIN: 13.7 g/dL (ref 13.0–17.0)
MCHC: 32.5 g/dL (ref 30.0–36.0)
MCV: 93.1 fl (ref 78.0–100.0)
RBC: 4.55 Mil/uL (ref 4.22–5.81)
RDW: 16.2 % — ABNORMAL HIGH (ref 11.5–15.5)
WBC: 8.1 10*3/uL (ref 4.0–10.5)

## 2016-12-08 LAB — BASIC METABOLIC PANEL
BUN: 17 mg/dL (ref 6–23)
CALCIUM: 8.7 mg/dL (ref 8.4–10.5)
CO2: 29 mEq/L (ref 19–32)
Chloride: 99 mEq/L (ref 96–112)
Creatinine, Ser: 1 mg/dL (ref 0.40–1.50)
GFR: 98.98 mL/min (ref >60.00)
GLUCOSE: 67 mg/dL — AB (ref 70–99)
POTASSIUM: 3.9 meq/L (ref 3.5–5.1)
Sodium: 139 mEq/L (ref 135–145)

## 2016-12-11 ENCOUNTER — Other Ambulatory Visit (INDEPENDENT_AMBULATORY_CARE_PROVIDER_SITE_OTHER): Payer: 59

## 2016-12-11 DIAGNOSIS — Z862 Personal history of diseases of the blood and blood-forming organs and certain disorders involving the immune mechanism: Secondary | ICD-10-CM

## 2016-12-11 LAB — CBC WITH DIFFERENTIAL/PLATELET
BASOS PCT: 0.4 % (ref 0.0–3.0)
Basophils Absolute: 0 10*3/uL (ref 0.0–0.1)
Eosinophils Absolute: 0.2 10*3/uL (ref 0.0–0.7)
Eosinophils Relative: 2.7 % (ref 0.0–5.0)
HEMATOCRIT: 42.5 % (ref 39.0–52.0)
Hemoglobin: 14 g/dL (ref 13.0–17.0)
LYMPHS ABS: 1 10*3/uL (ref 0.7–4.0)
Lymphocytes Relative: 13.7 % (ref 12.0–46.0)
MCHC: 32.9 g/dL (ref 30.0–36.0)
MCV: 92.3 fl (ref 78.0–100.0)
MONOS PCT: 4.8 % (ref 3.0–12.0)
Monocytes Absolute: 0.4 10*3/uL (ref 0.1–1.0)
NEUTROS ABS: 5.7 10*3/uL (ref 1.4–7.7)
NEUTROS PCT: 78.4 % — AB (ref 43.0–77.0)
PLATELETS: 482 10*3/uL — AB (ref 150.0–400.0)
RBC: 4.61 Mil/uL (ref 4.22–5.81)
RDW: 15.3 % (ref 11.5–15.5)
WBC: 7.3 10*3/uL (ref 4.0–10.5)

## 2016-12-11 NOTE — Telephone Encounter (Signed)
LMOM with contact name and number for return call RE: [facility] Disability Form to retrieve needed information to complete before he comes in to have labs drawn this morning/SLS 10/15 Completed [employer] form and faxed with confirmation; patient given original form with copy of confirmation/SLS 10/15  Patient asked about Social Security Disability paperwork for his hearing loss; informed pt that we have a 5-7 business day turn-around on the more detailed and numerous paged forms, and that we will call him when paperwork is completed and ready for pick-up. Patient understood & agreed/SLSL 10/17

## 2016-12-12 LAB — LEGIONELLA PNEUMOPHILA/CULTURE: Legionella Pneumophila DFA: NEGATIVE

## 2016-12-12 LAB — LEGIONELLA CULTURE REFLEX

## 2016-12-18 NOTE — Telephone Encounter (Signed)
Per provider, patient was informed that he will need to take SS Disability paperwork to his Audiologists and/or wait until he has ENT appointment and have their office complete the paperwork, as patient is new and we do not have enough information to successfully complete the paperwork; pt understood & agreed to pick-up papers during regular business hours/SLS

## 2017-01-05 ENCOUNTER — Telehealth: Payer: Self-pay | Admitting: *Deleted

## 2017-01-05 NOTE — Telephone Encounter (Signed)
Received hearing loss report from [facility]; forwarded to provider/SLS 11/09

## 2017-03-07 ENCOUNTER — Other Ambulatory Visit: Payer: Self-pay | Admitting: Family Medicine

## 2017-03-07 MED ORDER — LOSARTAN POTASSIUM-HCTZ 100-25 MG PO TABS: 1.0000 | ORAL_TABLET | Freq: Every morning | ORAL | 1 refills | Status: DC

## 2017-03-07 NOTE — Addendum Note (Signed)
Addended by: Sharon Seller B on: 03/07/2017 11:07 AM   Modules accepted: Orders

## 2017-03-09 ENCOUNTER — Ambulatory Visit: Payer: 59 | Admitting: Family Medicine

## 2017-03-09 ENCOUNTER — Encounter: Payer: Self-pay | Admitting: Family Medicine

## 2017-03-09 VITALS — BP 140/88 | HR 116 | Temp 98.4°F | Ht 74.0 in | Wt 323.4 lb

## 2017-03-09 DIAGNOSIS — I10 Essential (primary) hypertension: Secondary | ICD-10-CM | POA: Diagnosis not present

## 2017-03-09 MED ORDER — LISINOPRIL-HYDROCHLOROTHIAZIDE 20-25 MG PO TABS: 1.0000 | ORAL_TABLET | Freq: Every day | ORAL | 2 refills | Status: DC

## 2017-03-09 NOTE — Progress Notes (Signed)
Pre visit review using our clinic review tool, if applicable. No additional management support is needed unless otherwise documented below in the visit note. 

## 2017-03-09 NOTE — Patient Instructions (Signed)
Aim to do some physical exertion for 150 minutes per week. This is typically divided into 5 days per week, 30 minutes per day. The activity should be enough to get your heart rate up. Anything is better than nothing if you have time constraints.  Healthy Eating Plan Many factors influence your heart health, including eating and exercise habits. Heart (coronary) risk increases with abnormal blood fat (lipid) levels. Heart-healthy meal planning includes limiting unhealthy fats, increasing healthy fats, and making other small dietary changes. This includes maintaining a healthy body weight to help keep lipid levels within a normal range.  WHAT IS MY PLAN?  Your health care provider recommends that you:  Drink a glass of water before meals to help with satiety.  Eat slowly.  An alternative to the water is to add Metamucil. This will help with satiety as well. It does contain calories, unlike water.  WHAT TYPES OF FAT SHOULD I CHOOSE?  Choose healthy fats more often. Choose monounsaturated and polyunsaturated fats, such as olive oil and canola oil, flaxseeds, walnuts, almonds, and seeds.  Eat more omega-3 fats. Good choices include salmon, mackerel, sardines, tuna, flaxseed oil, and ground flaxseeds. Aim to eat fish at least two times each week.  Avoid foods with partially hydrogenated oils in them. These contain trans fats. Examples of foods that contain trans fats are stick margarine, some tub margarines, cookies, crackers, and other baked goods. If you are going to avoid a fat, this is the one to avoid!  WHAT GENERAL GUIDELINES DO I NEED TO FOLLOW?  Check food labels carefully to identify foods with trans fats. Avoid these types of options when possible.  Fill one half of your plate with vegetables and green salads. Eat 4-5 servings of vegetables per day. A serving of vegetables equals 1 cup of raw leafy vegetables,  cup of raw or cooked cut-up vegetables, or  cup of vegetable  juice.  Fill one fourth of your plate with whole grains. Look for the word "whole" as the first word in the ingredient list.  Fill one fourth of your plate with lean protein foods.  Eat 4-5 servings of fruit per day. A serving of fruit equals one medium whole fruit,  cup of dried fruit,  cup of fresh, frozen, or canned fruit. Try to avoid fruits in cups/syrups as the sugar content can be high.  Eat more foods that contain soluble fiber. Examples of foods that contain this type of fiber are apples, broccoli, carrots, beans, peas, and barley. Aim to get 20-30 g of fiber per day.  Eat more home-cooked food and less restaurant, buffet, and fast food.  Limit or avoid alcohol.  Limit foods that are high in starch and sugar.  Avoid fried foods when able.  Cook foods by using methods other than frying. Baking, boiling, grilling, and broiling are all great options. Other fat-reducing suggestions include: ? Removing the skin from poultry. ? Removing all visible fats from meats. ? Skimming the fat off of stews, soups, and gravies before serving them. ? Steaming vegetables in water or broth.  Lose weight if you are overweight. Losing just 5-10% of your initial body weight can help your overall health and prevent diseases such as diabetes and heart disease.  Increase your consumption of nuts, legumes, and seeds to 4-5 servings per week. One serving of dried beans or legumes equals  cup after being cooked, one serving of nuts equals 1 ounces, and one serving of seeds equals  ounce or   1 tablespoon.  WHAT ARE GOOD FOODS CAN I EAT? Grains Grainy breads (try to find bread that is 3 g of fiber per slice or greater), oatmeal, light popcorn. Whole-grain cereals. Rice and pasta, including brown rice and those that are made with whole wheat. Edamame pasta is a great alternative to grain pasta. It has a higher protein content. Try to avoid significant consumption of white bread, sugary cereals, or  pastries/baked goods.  Vegetables All vegetables. Cooked white potatoes do not count as vegetables.  Fruits All fruits, but limit pineapple and bananas as these fruits have a higher sugar content.  Meats and Other Protein Sources Lean, well-trimmed beef, veal, pork, and lamb. Chicken and turkey without skin. All fish and shellfish. Wild duck, rabbit, pheasant, and venison. Egg whites or low-cholesterol egg substitutes. Dried beans, peas, lentils, and tofu.Seeds and most nuts.  Dairy Low-fat or nonfat cheeses, including ricotta, string, and mozzarella. Skim or 1% milk that is liquid, powdered, or evaporated. Buttermilk that is made with low-fat milk. Nonfat or low-fat yogurt. Soy/Almond milk are good alternatives if you cannot handle dairy.  Beverages Water is the best for you. Sports drinks with less sugar are more desirable unless you are a highly active athlete.  Sweets and Desserts Sherbets and fruit ices. Honey, jam, marmalade, jelly, and syrups. Dark chocolate.  Eat all sweets and desserts in moderation.  Fats and Oils Nonhydrogenated (trans-free) margarines. Vegetable oils, including soybean, sesame, sunflower, olive, peanut, safflower, corn, canola, and cottonseed. Salad dressings or mayonnaise that are made with a vegetable oil. Limit added fats and oils that you use for cooking, baking, salads, and as spreads.  Other Cocoa powder. Coffee and tea. Most condiments.  The items listed above may not be a complete list of recommended foods or beverages. Contact your dietitian for more options.   

## 2017-03-09 NOTE — Progress Notes (Signed)
Chief Complaint  Patient presents with  . Follow-up    Subjective Peter Camacho is a 57 y.o. male who presents for hypertension follow up. He does not monitor home blood pressures. He is compliant with medications- Hyzaar 100-25 mg/d- recalled. Patient has these side effects of medication: none He is not adhering to a healthy diet overall. Current exercise: walk    Past Medical History:  Diagnosis Date  . At risk for sleep apnea    STOP-BANG= 5    SENT TO PCP 03-17-2014  . Hard of hearing   . History of seizure    HX menigitis x2  in middle and high school - residual seizure's on medications--  last seizure age 32  . Hypertension   . Personal history of meningitis    X2  MIDDLE AND HIGH SCHOOL--  residual seizure's on medication -- last seizure age 71  . Prostate cancer ([place]) MONITORED BY DR OTTELIN   stage T1c, Gleason 3+4, PSA 12.79, vol 38.5cc   Review of Systems Cardiovascular: no chest pain, palpitations Respiratory:  no shortness of breath  Exam BP 140/88 (BP Location: Left Arm, Patient Position: Sitting, Cuff Size: Large)   Pulse (!) 116   Temp 98.4 F (36.9 C) (Oral)   Ht 6\' 2"  (1.88 m)   Wt (!) 323 lb 6 oz (146.7 kg)   SpO2 95%   BMI 41.52 kg/m  General:  well developed, well nourished, in no apparent distress Skin: warm, no pallor or diaphoresis Eyes: pupils equal and round, sclera anicteric without injection Heart: mildly tachycardic and reg rhythm, no bruits, no LE edema Lungs: clear to auscultation, no accessory muscle use Psych: well oriented with normal range of affect and appropriate judgment/insight  Essential hypertension - Plan: CBC, TSH  Orders as above. Check CBC and TSH given tachycardia. If nml, will order Echo. Asymptomatic. EKG noted from 11/2016 to be tachy. Could be from smoking/caffeine, but want to make sure it is not something else.  Counseled on diet and exercise. Healthy diet handout given. Change Hyzaar to Prinzide.  F/u in 6  weeks. The patient voiced understanding and agreement to the plan.  [place], DO 03/09/17  4:35 PM

## 2017-03-10 LAB — CBC
HCT: 43.3 % (ref 38.5–50.0)
Hemoglobin: 14.6 g/dL (ref 13.2–17.1)
MCH: 29.4 pg (ref 27.0–33.0)
MCHC: 33.7 g/dL (ref 32.0–36.0)
MCV: 87.3 fL (ref 80.0–100.0)
MPV: 10 fL (ref 7.5–12.5)
PLATELETS: 294 10*3/uL (ref 140–400)
RBC: 4.96 10*6/uL (ref 4.20–5.80)
RDW: 14.1 % (ref 11.0–15.0)
WBC: 10.4 10*3/uL (ref 3.8–10.8)

## 2017-03-10 LAB — TSH: TSH: 1.76 mIU/L (ref 0.40–4.50)

## 2017-03-12 ENCOUNTER — Other Ambulatory Visit: Payer: Self-pay | Admitting: Family Medicine

## 2017-03-12 DIAGNOSIS — R000 Tachycardia, unspecified: Secondary | ICD-10-CM

## 2017-03-20 ENCOUNTER — Telehealth: Payer: Self-pay | Admitting: Medical

## 2017-03-20 NOTE — Telephone Encounter (Signed)
Copied from [place]. Topic: Quick Communication - Other Results >> Mar 20, 2017  2:40 PM Rosalin Hawking wrote: Pt never picked up document that was filled out by provider (Wink ) with date since Sept 19,2018. Document mailed to Pt.

## 2017-04-02 ENCOUNTER — Ambulatory Visit (HOSPITAL_BASED_OUTPATIENT_CLINIC_OR_DEPARTMENT_OTHER)
Admission: RE | Admit: 2017-04-02 | Discharge: 2017-04-02 | Disposition: A | Discharge status: Home / Self Care | Payer: 59 | Source: Ambulatory Visit | Attending: Family Medicine | Admitting: Family Medicine

## 2017-04-02 DIAGNOSIS — I051 Rheumatic mitral insufficiency: Secondary | ICD-10-CM | POA: Diagnosis not present

## 2017-04-02 DIAGNOSIS — R000 Tachycardia, unspecified: Secondary | ICD-10-CM | POA: Diagnosis not present

## 2017-04-02 NOTE — Progress Notes (Signed)
Echocardiogram 2D Echocardiogram has been performed.  Peter Camacho 04/02/2017, 9:19 AM

## 2017-06-09 ENCOUNTER — Other Ambulatory Visit: Payer: Self-pay | Admitting: Family Medicine

## 2017-06-09 DIAGNOSIS — I10 Essential (primary) hypertension: Secondary | ICD-10-CM

## 2017-08-08 ENCOUNTER — Ambulatory Visit: Payer: 59 | Admitting: Family Medicine

## 2017-08-09 ENCOUNTER — Encounter: Payer: Self-pay | Admitting: Family Medicine

## 2017-08-09 ENCOUNTER — Ambulatory Visit: Payer: 59 | Admitting: Family Medicine

## 2017-08-09 VITALS — BP 160/100 | HR 105 | Temp 98.3°F | Ht 74.0 in | Wt 329.0 lb

## 2017-08-09 DIAGNOSIS — J302 Other seasonal allergic rhinitis: Secondary | ICD-10-CM | POA: Diagnosis not present

## 2017-08-09 DIAGNOSIS — I10 Essential (primary) hypertension: Secondary | ICD-10-CM

## 2017-08-09 DIAGNOSIS — M722 Plantar fascial fibromatosis: Secondary | ICD-10-CM | POA: Diagnosis not present

## 2017-08-09 MED ORDER — METHYLPREDNISOLONE ACETATE 80 MG/ML IJ SUSP
80.0000 mg | Freq: Once | INTRAMUSCULAR | Status: AC
  Administered 2017-08-09: 80 mg via INTRAMUSCULAR

## 2017-08-09 NOTE — Progress Notes (Signed)
Musculoskeletal Exam  Patient: Peter Camacho DOB: 03/01/1960  DOS: 08/09/2017  SUBJECTIVE:  Chief Complaint:   Chief Complaint  Patient presents with  . Allergies  . Foot Pain    [name] is a 57 y.o.  male for evaluation and treatment of L heel pain.   Onset:  3 months ago.  Works on [employer]: L heel (prox insertion of plantar fascia) Character:  sharp and stabbing  Progression of issue:  is unchanged Associated symptoms: None Treatment: to date has been heel cups and wearing 3 parirs of socks.   Neurovascular symptoms: no  Hypertension Patient presents for hypertension follow up. He does monitor home blood pressures. Blood pressures ranging on average from 120's/80's. He is compliant with medications. Patient has these side effects of medication: none He reports he is adhering to a healthy diet overall. Exercise: physically active at work  He also has a history of allergies triggering shortness of breath and wheezing.  He recently refilled his albuterol inhaler which is helpful.  His wheezing is much better since he change his air conditioning filtration.  ROS: Musculoskeletal/Extremities: +L heel pain Heart: No CP  Past Medical History:  Diagnosis Date  . At risk for sleep apnea    STOP-BANG= 5    SENT TO PCP 03-17-2014  . Hard of hearing   . History of seizure    HX menigitis x2  in middle and high school - residual seizure's on medications--  last seizure age 83  . Hypertension   . Personal history of meningitis    X2  MIDDLE AND HIGH SCHOOL--  residual seizure's on medication -- last seizure age 75  . Prostate cancer ([place]) MONITORED BY DR OTTELIN   stage T1c, Gleason 3+4, PSA 12.79, vol 38.5cc    Objective: VITAL SIGNS: BP (!) 160/100 (BP Location: Right Arm, Patient Position: Sitting, Cuff Size: Large)   Pulse (!) 105   Temp 98.3 F (36.8 C) (Oral)   Ht 6\' 2"  (1.88 m)   Wt (!) 329 lb (149.2 kg)   SpO2 96%   BMI 42.24 kg/m   Constitutional: Well formed, well developed. No acute distress. Cardiovascular: Brisk cap refill Thorax & Lungs: No accessory muscle use Musculoskeletal: L heel.   Tenderness to palpation: yes, over medial prox insertion of plantar fascia.  Deformity: no Ecchymosis: no Neurologic: Normal sensory function. No focal deficits noted. DTR's equal and symmetry in LE's. No clonus. Psychiatric: Normal mood. Age appropriate judgment and insight. Alert & oriented x 3.    Assessment:  Plantar fasciitis  Seasonal allergies  Essential hypertension  Plan: Ice, anti-inflammatories, Tylenol, home stretches/exercises, arch inserts, Strassburg sock recommended. He may need a nasal spray.  Depo-Medrol today to help with both breathing and allergies. He did not take his blood pressure medicine today because he was late for work.  Counseled on diet and exercise.  Continue checking blood pressure at home.  No change in medication at this time. F/u 6 weeks to recheck blood pressure.  Plantar fasciitis is still bothersome, will give cortisone injection.. The patient voiced understanding and agreement to the plan.   [place], DO 08/09/17  4:26 PM

## 2017-08-09 NOTE — Patient Instructions (Addendum)
Claritin (loratadine), Allegra (fexofenadine), Zyrtec (cetirizine); these are listed in order from weakest to strongest. Generic, and therefore cheaper, options are in the parentheses.   Flonase (fluticasone); nasal spray that is over the counter. 2 sprays each nostril, once daily. Aim towards the same side eye when you spray.  There are available OTC, and the generic versions, which may be cheaper, are in parentheses. Show this to a pharmacist if you have trouble finding any of these items.  Ice/cold pack over area for 10-15 min twice daily.  OK to take Tylenol 1000 mg (2 extra strength tabs) or 975 mg (3 regular strength tabs) every 6 hours as needed.  Consider Str[place]burg sock to wear at night. I want you to get an insert to support the arch of your foot, not the heel. Powerstep is a good option. If your pain is not improved by the next time we meet, I will give you an injection in the area of pain.   Plantar Fasciitis Stretches/exercises Do exercises exactly as told by your health care provider and adjust them as directed. It is normal to feel mild stretching, pulling, tightness, or discomfort as you do these exercises, but you should stop right away if you feel sudden pain or your pain gets worse.   Stretching and range of motion exercises These exercises warm up your muscles and joints and improve the movement and flexibility of your foot. These exercises also help to relieve pain.  Exercise A: Plantar fascia stretch 1. Sit with your left / right leg crossed over your opposite knee. 2. Hold your heel with one hand with that thumb near your arch. With your other hand, hold your toes and gently pull them back toward the top of your foot. You should feel a stretch on the bottom of your toes or your foot or both. 3. Hold this stretch for 30 seconds. 4. Slowly release your toes and return to the starting position. Repeat 2 times. Complete this exercise 3 times per week.  Exercise B:  Gastroc, standing 1. Stand with your hands against a wall. 2. Extend your left / right leg behind you, and bend your front knee slightly. 3. Keeping your heels on the floor and keeping your back knee straight, shift your weight toward the wall without arching your back. You should feel a gentle stretch in your left / right calf. 4. Hold this position for 30 seconds. Repeat 2 times. Complete this exercise 3 times a week. Exercise C: Soleus, standing 1. Stand with your hands against a wall. 2. Extend your left / right leg behind you, and bend your front knee slightly. 3. Keeping your heels on the floor, bend your back knee and slightly shift your weight over the back leg. You should feel a gentle stretch deep in your calf. 4. Hold this position for 30 seconds. Repeat 2 times. Complete this exercise 3 times per week. Exercise D: Gastrocsoleus, standing 1. Stand with the ball of your left / right foot on a step. The ball of your foot is on the walking surface, right under your toes. 2. Keep your other foot firmly on the same step. 3. Hold onto the wall or a railing for balance. 4. Slowly lift your other foot, allowing your body weight to press your heel down over the edge of the step. You should feel a stretch in your left / right calf. 5. Hold this position for 30 seconds. 6. Return both feet to the step. 7. Repeat this  exercise with a slight bend in your left / right knee. Repeat 2 times with your left / right knee straight and 2times with your left / right knee bent. Complete this exercise 3 times a week.  Balance exercise This exercise builds your balance and strength control of your arch to help take pressure off your plantar fascia. Exercise E: Single leg stand 1. Without shoes, stand near a railing or in a doorway. You may hold onto the railing or door frame as needed. 2. Stand on your left / right foot. Keep your big toe down on the floor and try to keep your arch lifted. Do not let  your foot roll inward. 3. Hold this position for 30 seconds. 4. If this exercise is too easy, you can try it with your eyes closed or while standing on a pillow. Repeat 2 times. Complete this exercise 3 times per week. Make sure you discuss any questions you have with your health care provider. Document Released: 02/13/2005 Document Revised: 10/19/2015 Document Reviewed: 12/28/2014 Elsevier Interactive Patient Education  2017 [employer].

## 2017-08-09 NOTE — Progress Notes (Signed)
Pre visit review using our clinic review tool, if applicable. No additional management support is needed unless otherwise documented below in the visit note. 

## 2017-08-09 NOTE — Addendum Note (Signed)
Addended by: Sharon Seller B on: 08/09/2017 04:34 PM   Modules accepted: Orders

## 2017-09-04 ENCOUNTER — Other Ambulatory Visit: Payer: Self-pay | Admitting: Family Medicine

## 2017-09-04 DIAGNOSIS — J302 Other seasonal allergic rhinitis: Secondary | ICD-10-CM

## 2017-09-05 NOTE — Telephone Encounter (Signed)
Pt. Requesting ventolin, last refilled 11/2016 with 2RF by Dr. Erlinda Hong. Order pended, routed to Dr. Nani Ravens.

## 2017-09-16 ENCOUNTER — Other Ambulatory Visit: Payer: Self-pay | Admitting: Family Medicine

## 2017-09-16 DIAGNOSIS — I10 Essential (primary) hypertension: Secondary | ICD-10-CM

## 2017-09-20 ENCOUNTER — Ambulatory Visit: Payer: 59 | Admitting: Family Medicine

## 2017-09-20 DIAGNOSIS — Z8546 Personal history of malignant neoplasm of prostate: Secondary | ICD-10-CM | POA: Diagnosis not present

## 2017-09-21 ENCOUNTER — Ambulatory Visit: Payer: 59 | Admitting: Family Medicine

## 2017-09-24 ENCOUNTER — Ambulatory Visit: Payer: 59 | Admitting: Family Medicine

## 2017-09-24 ENCOUNTER — Encounter: Payer: Self-pay | Admitting: Family Medicine

## 2017-09-24 VITALS — BP 134/82 | HR 84 | Temp 98.1°F | Ht 74.0 in | Wt 322.4 lb

## 2017-09-24 DIAGNOSIS — I10 Essential (primary) hypertension: Secondary | ICD-10-CM | POA: Diagnosis not present

## 2017-09-24 DIAGNOSIS — M722 Plantar fascial fibromatosis: Secondary | ICD-10-CM | POA: Diagnosis not present

## 2017-09-24 NOTE — Progress Notes (Signed)
Pre visit review using our clinic review tool, if applicable. No additional management support is needed unless otherwise documented below in the visit note. 

## 2017-09-24 NOTE — Progress Notes (Signed)
Chief Complaint  Patient presents with  . Follow-up    Subjective Peter Camacho is a 57 y.o. male who presents for hypertension follow up. He will sometimes monitor home blood pressures. Blood pressures ranging from 120-130's/80's on average. He is compliant with medications- Prinzide 20-25 mg/d. Patient has these side effects of medication: none He is adhering to a healthy diet overall. Current exercise: walking   Past Medical History:  Diagnosis Date  . At risk for sleep apnea    STOP-BANG= 5    SENT TO PCP 03-17-2014  . Hard of hearing   . History of seizure    HX menigitis x2  in middle and high school - residual seizure's on medications--  last seizure age 83  . Hypertension   . Personal history of meningitis    X2  MIDDLE AND HIGH SCHOOL--  residual seizure's on medication -- last seizure age 89  . Prostate cancer ([place]) MONITORED BY DR OTTELIN   stage T1c, Gleason 3+4, PSA 12.79, vol 38.5cc    Review of Systems Cardiovascular: no chest pain Respiratory:  no shortness of breath  Exam BP 134/82 (BP Location: Left Arm, Patient Position: Sitting, Cuff Size: Large)   Pulse 84   Temp 98.1 F (36.7 C) (Oral)   Ht 6\' 2"  (1.88 m)   Wt (!) 322 lb 6 oz (146.2 kg)   SpO2 95%   BMI 41.39 kg/m  General:  well developed, well nourished, in no apparent distress Heart: RRR, no bruits, no LE edema Lungs: clear to auscultation, no accessory muscle use Psych: well oriented with normal range of affect and appropriate judgment/insight  Essential hypertension  Plantar fasciitis  Cont Prinzide. PF has improved since injection.  Counseled on diet and exercise. Doing well. F/u in 3 mo for a CPE. The patient voiced understanding and agreement to the plan.  [place], DO 09/24/17  4:33 PM

## 2017-09-24 NOTE — Patient Instructions (Signed)
Keep up the good work with your weight loss. Keep the diet clean and stay active.  Make sure to take your blood pressure medicine daily!  Let us know if you need anything.

## 2017-11-29 ENCOUNTER — Telehealth: Payer: Self-pay | Admitting: Family Medicine

## 2017-11-29 MED ORDER — IBUPROFEN 800 MG PO TABS: 800.0000 mg | ORAL_TABLET | Freq: Three times a day (TID) | ORAL | 0 refills | Status: DC | PRN

## 2017-11-29 NOTE — Addendum Note (Signed)
Addended by: Sharon Seller B on: 11/29/2017 04:57 PM   Modules accepted: Orders

## 2017-11-29 NOTE — Telephone Encounter (Signed)
OK 

## 2017-11-29 NOTE — Telephone Encounter (Signed)
Sent in as requested 

## 2017-11-29 NOTE — Telephone Encounter (Signed)
Copied from [place] [id number]. Topic: General - Other >> Nov 29, 2017  1:19 PM Carolyn Stare wrote:  Pt call to req a refill on Ibuprofen 800 mg    Pharmacy [facility]  I do not see this on his medication list.

## 2017-12-24 ENCOUNTER — Encounter: Payer: Self-pay | Admitting: Family Medicine

## 2017-12-24 ENCOUNTER — Ambulatory Visit: Payer: 59 | Admitting: Family Medicine

## 2017-12-24 ENCOUNTER — Other Ambulatory Visit: Payer: Self-pay | Admitting: Family Medicine

## 2017-12-24 VITALS — BP 136/85 | HR 95 | Temp 98.2°F | Resp 16 | Ht 74.0 in | Wt 316.0 lb

## 2017-12-24 DIAGNOSIS — J302 Other seasonal allergic rhinitis: Secondary | ICD-10-CM

## 2017-12-24 DIAGNOSIS — Z1211 Encounter for screening for malignant neoplasm of colon: Secondary | ICD-10-CM

## 2017-12-24 DIAGNOSIS — I10 Essential (primary) hypertension: Secondary | ICD-10-CM

## 2017-12-24 DIAGNOSIS — Z1159 Encounter for screening for other viral diseases: Secondary | ICD-10-CM

## 2017-12-24 DIAGNOSIS — Z23 Encounter for immunization: Secondary | ICD-10-CM

## 2017-12-24 LAB — COMPREHENSIVE METABOLIC PANEL
ALK PHOS: 102 U/L (ref 39–117)
ALT: 15 U/L (ref 0–53)
AST: 16 U/L (ref 0–37)
Albumin: 3.9 g/dL (ref 3.5–5.2)
BUN: 16 mg/dL (ref 6–23)
CALCIUM: 9.1 mg/dL (ref 8.4–10.5)
CO2: 31 meq/L (ref 19–32)
CREATININE: 1.05 mg/dL (ref 0.40–1.50)
Chloride: 99 mEq/L (ref 96–112)
GFR: 93.22 mL/min (ref >60.00)
GLUCOSE: 105 mg/dL — AB (ref 70–99)
Potassium: 3.8 mEq/L (ref 3.5–5.1)
Sodium: 138 mEq/L (ref 135–145)
TOTAL PROTEIN: 7.2 g/dL (ref 6.0–8.3)
Total Bilirubin: 0.4 mg/dL (ref 0.2–1.2)

## 2017-12-24 MED ORDER — ALBUTEROL SULFATE HFA 108 (90 BASE) MCG/ACT IN AERS: INHALATION_SPRAY | RESPIRATORY_TRACT | 2 refills | Status: DC

## 2017-12-24 NOTE — Patient Instructions (Addendum)
Keep the diet clean. Stay active.   Because your blood pressure is well-controlled, you no longer have to check your blood pressure at home anymore unless you wish. Some people check it twice daily every day and some people stop altogether. Either or anything in between is fine. Strong work!  If you do not hear anything about your referral in the next 1-2 weeks, call our office and ask for an update.  Goal weight loss by 6 months: 300 lbs  Donut pads are available over the counter.   Claritin (loratadine), Allegra (fexofenadine), Zyrtec (cetirizine); these are listed in order from weakest to strongest. Generic, and therefore cheaper, options are in the parentheses.   Flonase (fluticasone); nasal spray that is over the counter. 2 sprays each nostril, once daily. Aim towards the same side eye when you spray.  There are available OTC, and the generic versions, which may be cheaper, are in parentheses. Show this to a pharmacist if you have trouble finding any of these items.

## 2017-12-24 NOTE — Progress Notes (Signed)
Chief Complaint  Patient presents with  . Hypertension    Here for follow up    Peter Camacho is a 57 y.o. male who presents for hypertension follow up. He does monitor intermittently home blood pressures. Blood pressures ranging from 120-130's/80's on average. He is compliant with medications- Prinzide 20-25 mg/d. Patient has these side effects of medication: none He is sometimes adhering to a healthy diet overall. Current exercise: walking  Has lost 6 lbs.  Has never been screened for Hep C or had colonoscopy.   Last Tdap >10 yrs ago.    Past Medical History:  Diagnosis Date  . At risk for sleep apnea    STOP-BANG= 5    SENT TO PCP 03-17-2014  . Hard of hearing   . History of seizure    HX menigitis x2  in middle and high school - residual seizure's on medications--  last seizure age 27  . Hypertension   . Personal history of meningitis    X2  MIDDLE AND HIGH SCHOOL--  residual seizure's on medication -- last seizure age 33  . Prostate cancer ([place]) MONITORED BY DR OTTELIN   stage T1c, Gleason 3+4, PSA 12.79, vol 38.5cc    Review of Systems Cardiovascular: no chest pain Respiratory:  no shortness of breath  Exam BP 136/85 (BP Location: Left Arm, Patient Position: Sitting, Cuff Size: Large)   Pulse 95   Temp 98.2 F (36.8 C) (Oral)   Resp 16   Ht 6\' 2"  (1.88 m)   Wt (!) 316 lb (143.3 kg)   SpO2 98%   BMI 40.57 kg/m  General:  well developed, well nourished, in no apparent distress Heart: RRR, no bruits, trace b/l pitting LE edema Lungs: clear to auscultation, no accessory muscle use Psych: well oriented with normal range of affect and appropriate judgment/insight  Essential hypertension  Morbid obesity ([place])  Needs flu shot - Plan: Flu Vaccine QUAD 36+ mos IM (Fluarix & Fluzone Quad PF  Seasonal allergies - Plan: albuterol (VENTOLIN HFA) 108 (90 Base) MCG/ACT inhaler  Cont Prinzide. Goal weight in 6 mo: 300 lbs Counseled on diet and  exercise. Does not need to ck BP's at home anymore. F/u in 6 mo for CPE or prn. The patient voiced understanding and agreement to the plan.  [place], DO 12/24/17  7:19 AM

## 2017-12-24 NOTE — Addendum Note (Signed)
Addended by: Jiles Prows on: 12/24/2017 07:40 AM   Modules accepted: Orders

## 2017-12-27 LAB — HEPATITIS C ANTIBODY
Hepatitis C Ab: REACTIVE — AB
SIGNAL TO CUT-OFF: 2 — ABNORMAL HIGH (ref <1.00)

## 2017-12-27 LAB — HCV RNA,QUANTITATIVE REAL TIME PCR
HCV Quantitative Log: <1.18 Log IU/mL
HCV RNA, PCR, QN: NOT DETECTED [IU]/mL

## 2017-12-28 ENCOUNTER — Telehealth: Payer: Self-pay | Admitting: Family Medicine

## 2017-12-28 DIAGNOSIS — R768 Other specified abnormal immunological findings in serum: Secondary | ICD-10-CM

## 2017-12-28 MED ORDER — LEVOCETIRIZINE DIHYDROCHLORIDE 5 MG PO TABS: 5.0000 mg | ORAL_TABLET | Freq: Every evening | ORAL | 2 refills | Status: DC

## 2017-12-28 NOTE — Telephone Encounter (Signed)
Copied from [place] [id number] [id number]. Topic: General - Other >> Dec 28, 2017  9:15 AM Leward Quan A wrote: Reason for CRM: Patient is requesting a call back from Dr Nani Ravens or nurse he would like to redo his labs said something was wrong when he last had it done. Ph# [phone]

## 2017-12-28 NOTE — Telephone Encounter (Signed)
Called and scheduled lab appt

## 2017-12-28 NOTE — Telephone Encounter (Signed)
Called pt to discuss results. We are going to reck in 6 weeks and if positive again, will obtain a genotype and get [place].  RE- please schedule pt in 6 weeks. I have placed order. TY.

## 2018-01-21 ENCOUNTER — Other Ambulatory Visit: Payer: Self-pay | Admitting: Family Medicine

## 2018-02-08 ENCOUNTER — Encounter: Payer: Self-pay | Admitting: Family Medicine

## 2018-02-08 ENCOUNTER — Other Ambulatory Visit (INDEPENDENT_AMBULATORY_CARE_PROVIDER_SITE_OTHER): Payer: 59

## 2018-02-08 DIAGNOSIS — R768 Other specified abnormal immunological findings in serum: Secondary | ICD-10-CM | POA: Diagnosis not present

## 2018-02-13 LAB — HCV RNA,QUANTITATIVE REAL TIME PCR
HCV Quantitative Log: <1.18 Log IU/mL
HCV RNA, PCR, QN: <15 IU/mL

## 2018-02-13 LAB — HEPATITIS C ANTIBODY
Hepatitis C Ab: REACTIVE — AB
SIGNAL TO CUT-OFF: 1.23 — ABNORMAL HIGH (ref <1.00)

## 2018-02-24 ENCOUNTER — Other Ambulatory Visit: Payer: Self-pay

## 2018-02-24 ENCOUNTER — Emergency Department (HOSPITAL_BASED_OUTPATIENT_CLINIC_OR_DEPARTMENT_OTHER)
Admission: EM | Admit: 2018-02-24 | Discharge: 2018-02-24 | Disposition: A | Discharge status: Home / Self Care | Payer: 59 | Attending: Emergency Medicine | Admitting: Emergency Medicine

## 2018-02-24 ENCOUNTER — Encounter (HOSPITAL_BASED_OUTPATIENT_CLINIC_OR_DEPARTMENT_OTHER): Payer: Self-pay | Admitting: Emergency Medicine

## 2018-02-24 DIAGNOSIS — Z87891 Personal history of nicotine dependence: Secondary | ICD-10-CM | POA: Insufficient documentation

## 2018-02-24 DIAGNOSIS — Z8546 Personal history of malignant neoplasm of prostate: Secondary | ICD-10-CM | POA: Insufficient documentation

## 2018-02-24 DIAGNOSIS — Z79899 Other long term (current) drug therapy: Secondary | ICD-10-CM | POA: Diagnosis not present

## 2018-02-24 DIAGNOSIS — I10 Essential (primary) hypertension: Secondary | ICD-10-CM | POA: Diagnosis not present

## 2018-02-24 DIAGNOSIS — M79604 Pain in right leg: Secondary | ICD-10-CM

## 2018-02-24 DIAGNOSIS — Z923 Personal history of irradiation: Secondary | ICD-10-CM | POA: Diagnosis not present

## 2018-02-24 MED ORDER — METHOCARBAMOL 500 MG PO TABS: 500.0000 mg | ORAL_TABLET | Freq: Every evening | ORAL | 0 refills | Status: DC | PRN

## 2018-02-24 MED ORDER — PREDNISONE 10 MG (21) PO TBPK: ORAL_TABLET | Freq: Every day | ORAL | 0 refills | Status: DC

## 2018-02-24 MED ORDER — ACETAMINOPHEN 325 MG PO TABS: 650.0000 mg | ORAL_TABLET | Freq: Four times a day (QID) | ORAL | 0 refills | Status: DC | PRN

## 2018-02-24 MED ORDER — KETOROLAC TROMETHAMINE 15 MG/ML IJ SOLN
15.0000 mg | Freq: Once | INTRAMUSCULAR | Status: AC
  Administered 2018-02-24: 15 mg via INTRAMUSCULAR
  Filled 2018-02-24: qty 1

## 2018-02-24 NOTE — ED Notes (Signed)
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 Able to ambulate.

## 2018-02-24 NOTE — ED Triage Notes (Signed)
Pt reports pain to the entire r leg x 3 weeks. Denies injury.

## 2018-02-24 NOTE — Discharge Instructions (Addendum)
Your leg pain today is likely due to sciatica. However, it is important to make sure this is not something dangerous. We will do this by doing an ultrasound tomorrow morning.  Go to the radiology department of [facility] at 9:45 tomorrow morning for your 10 AM ultrasound.  If it is negative, we will treat for sciatica.  Take prednisone as prescribed.  Take the entire course, even if your symptoms improve. Use Tylenol as needed for pain control. Use muscle relaxers to help with pain and swelling.  Have caution, as this may make you tired or groggy.  Do not drive or operate heavy machinery while taking this medicine.  I recommend you take it at night. Use heat or ice to help with pain and swelling. You do gentle stretches. Follow-up with your primary care doctor if your symptoms are not improving in the next week. Return to the emergency room if you develop any new, worsening, concerning symptoms.

## 2018-02-24 NOTE — ED Notes (Signed)
Ambulatory to restroom and Room with no issues,

## 2018-02-24 NOTE — ED Provider Notes (Signed)
[facility] Provider Note   CSN: [id number] Arrival date & time: 02/24/18  1711     History   Chief Complaint Chief Complaint  Patient presents with  . Leg Pain    HPI Peter Camacho is a 57 y.o. male presenting for evaluation of R leg pain.   Pt states he has been having right-sided leg pain for the past 3 weeks.  Pain is worse when he stands up and walks.  Pain is mostly of his posterior upper thigh, but also occasionally in the anterior upper thigh.  Does not extend down into the foot.  Occasionally, he had some low back pain.  He denies fall, trauma, or injury.  He denies numbness, tingling.  He has been taking ibuprofen without improvement of symptoms.  He denies leg swelling.  He denies symptoms of the left side.  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  He has not followed up with his primary care doctor regarding this.  Patient denies recent travel, surgeries, immobilization, previous DVT/PE, or hormone use.  Patient states he had a history of prostate cancer, but went through radiation and has been in remission for several years.  He denies fevers, chills, chest pain, shortness of breath, nausea, vomiting abdominal pain.  Patient states he has had tachycardia before, he has this intermittently.  He states just before coming in, he smoked a cigarette he thinks this is why he is tachycardic now.  HPI  Past Medical History:  Diagnosis Date  . At risk for sleep apnea    STOP-BANG= 5    SENT TO PCP 03-17-2014  . Hard of hearing   . History of seizure    HX menigitis x2  in middle and high school - residual seizure's on medications--  last seizure age 31  . Hypertension   . Personal history of meningitis    X2  MIDDLE AND HIGH SCHOOL--  residual seizure's on medication -- last seizure age 24  . Prostate cancer ([place]) MONITORED BY DR OTTELIN   stage T1c, Gleason 3+4, PSA 12.79, vol 38.5cc    Patient Active Problem List   Diagnosis Date Noted  . Morbid obesity ([place]) 12/24/2017  . Plantar fasciitis 08/09/2017  . Seasonal allergies 08/09/2017  . Lobar pneumonia ([place]) 11/30/2016  . Sepsis due to pneumonia ([place]) 11/29/2016  . Essential hypertension 11/29/2016  . Tobacco dependence 11/29/2016  . Prostate Cancer with a Gleason's Score of 3+4 and a PSA of 12.79 01/11/2014    Past Surgical History:  Procedure Laterality Date  . PROSTATE BIOPSY    . RADIOACTIVE SEED IMPLANT N/A 03/20/2014   Procedure: RADIOACTIVE SEED IMPLANT;  Surgeon: Claybon Jabs, MD;  Location: [facility];  Service: Urology;  Laterality: N/A;        Home Medications    Prior to Admission medications   Medication Sig Start Date End Date Taking? Authorizing Provider  acetaminophen (TYLENOL) 325 MG tablet Take 2 tablets (650 mg total) by mouth every 6 (six) hours as needed. 02/24/18   Donie Moulton, PA-C  albuterol (VENTOLIN HFA) 108 (90 Base) MCG/ACT inhaler TAKE 2 PUFFS BY MOUTH EVERY 6 HOURS AS NEEDED FOR WHEEZE OR SHORTNESS OF BREATH 12/24/17   [name], DO  ibuprofen (ADVIL,MOTRIN) 800 MG tablet TAKE 1 TABLET BY MOUTH EVERY 8 HOURS AS NEEDED 01/21/18   [name], DO  levocetirizine (XYZAL) 5 MG tablet Take 1 tablet (5 mg total) by mouth every evening. 12/28/17   [name], DO  lisinopril-hydrochlorothiazide (PRINZIDE,ZESTORETIC) 20-25 MG tablet TAKE 1 TABLET BY MOUTH EVERY DAY 12/24/17   Wendling, [name], DO  methocarbamol (ROBAXIN) 500 MG tablet Take 1 tablet (500 mg total) by mouth at bedtime as needed for muscle spasms. 02/24/18   Din Bookwalter, PA-C  predniSONE (STERAPRED UNI-PAK 21 TAB) 10 MG (21) TBPK tablet Take by mouth daily. Take 6 tabs by mouth daily  for 2 days, then 5 tabs for 2 days, then 4 tabs for 2 days, then 3 tabs for 2 days, 2 tabs for 2 days, then 1 tab by mouth  daily for 2 days 02/24/18   Jerzee Jerome, PA-C    Family History Family History  Problem Relation Age of Onset  . CAD Mother 30  . Diabetes Mother   . CAD Father 8  . Cancer Neg Hx     Social History Social History   Tobacco Use  . Smoking status: Former Smoker    Packs/day: 0.30    Years: 40.00    Pack years: 12.00    Types: Cigarettes  . Smokeless tobacco: Never Used  . Tobacco comment: quit 11/11/16  Substance Use Topics  . Alcohol use: No  . Drug use: No     Allergies   Fish-derived products   Review of Systems Review of Systems  Musculoskeletal: Positive for myalgias.  All other systems reviewed and are negative.    Physical Exam Updated Vital Signs BP (!) 167/116 (BP Location: Left Arm)   Pulse 98   Temp 98.5 F (36.9 C) (Oral)   Resp 18   Ht 6\' 3"  (1.905 m)   Wt (!) 143.8 kg   SpO2 99%   BMI 39.62 kg/m   Physical Exam Vitals signs and nursing note reviewed.  Constitutional:      General: He is not in acute distress.    Appearance: He is well-developed.     Comments: Sitting in the bed in no acute distress  HENT:     Head: Normocephalic and atraumatic.  Eyes:     Conjunctiva/sclera: Conjunctivae normal.     Pupils: Pupils are equal, round, and reactive to light.  Neck:     Musculoskeletal: Normal range of motion and neck supple.  Cardiovascular:     Rate and Rhythm: Normal rate and regular rhythm.  Pulmonary:     Effort: Pulmonary effort is normal. No respiratory distress.     Breath sounds: Normal breath sounds. No wheezing.  Abdominal:     General: Bowel sounds are normal. There is no distension.     Palpations: Abdomen is soft.     Tenderness: There is no abdominal tenderness.  Musculoskeletal:     Comments: No tenderness palpation of back or midline spine.  No tenderness palpation of the low back or buttock.  No tenderness palpation of the posterior leg or calf.  Pedal pulses intact bilaterally.  Upon ambulation, patient with  mild limp due to right leg pain. 1+ pitting edema bilaterally, possibly mildly worse on R side.   Skin:    General: Skin is warm and dry.     Capillary Refill: Capillary refill takes less than 2 seconds.  Neurological:     Mental Status: He is alert and oriented to person, place, and time.      ED Treatments / Results  Labs (all labs ordered are listed, but only  abnormal results are displayed) Labs Reviewed - No data to display  EKG None  Radiology No results found.  Procedures Procedures (including critical care time)  Medications Ordered in ED Medications  ketorolac (TORADOL) 15 MG/ML injection 15 mg (15 mg Intramuscular Given 02/24/18 2041)     Initial Impression / Assessment and Plan / ED Course  I have reviewed the triage vital signs and the nursing notes.  Pertinent labs & imaging results that were available during my care of the patient were reviewed by me and considered in my medical decision making (see chart for details).     Patient presenting for evaluation of right leg pain.  Physical exam shows patient who is neurovascularly intact.  Symptoms most likely consistent with sciatica, however considering history of prostate cancer, and possible mild worsening swelling of the right side, will obtain ultrasound. Unfortunately, ultrasound is not available at this facility at this time.  Will order outpatient ultrasound for tomorrow.  Patient informed of plan and is agreeable.  Toradol given, patient reports improvement of pain.  Patient initially tachycardic, improved without intervention.  Patient with a history of tachycardia, denying chest pain or shortness of breath.  I do not believe he needs a PE work-up at this time, however if ultrasound is positive for DVT tomorrow, consider need for further thoracic evaluation.  At this time, patient appears safe for discharge.  Return precautions given.  Patient states he understands and agrees to plan.   Final Clinical  Impressions(s) / ED Diagnoses   Final diagnoses:  Right leg pain    ED Discharge Orders         Ordered    predniSONE (STERAPRED UNI-PAK 21 TAB) 10 MG (21) TBPK tablet  Daily     02/24/18 2131    methocarbamol (ROBAXIN) 500 MG tablet  At bedtime PRN     02/24/18 2131    acetaminophen (TYLENOL) 325 MG tablet  Every 6 hours PRN     02/24/18 2131           Zahid Carneiro, PA-C 02/25/18 0003    Deno Etienne, DO 02/25/18 0020

## 2018-02-25 ENCOUNTER — Ambulatory Visit (HOSPITAL_BASED_OUTPATIENT_CLINIC_OR_DEPARTMENT_OTHER)
Admission: RE | Admit: 2018-02-25 | Discharge: 2018-02-25 | Disposition: A | Discharge status: Home / Self Care | Payer: 59 | Source: Ambulatory Visit | Attending: Student | Admitting: Student

## 2018-02-25 ENCOUNTER — Inpatient Hospital Stay (HOSPITAL_BASED_OUTPATIENT_CLINIC_OR_DEPARTMENT_OTHER): Admit: 2018-02-25 | Payer: 59

## 2018-02-25 ENCOUNTER — Other Ambulatory Visit (HOSPITAL_BASED_OUTPATIENT_CLINIC_OR_DEPARTMENT_OTHER): Payer: Self-pay | Admitting: Student

## 2018-02-25 DIAGNOSIS — R52 Pain, unspecified: Secondary | ICD-10-CM

## 2018-02-25 DIAGNOSIS — M79604 Pain in right leg: Secondary | ICD-10-CM | POA: Diagnosis not present

## 2018-02-25 MED FILL — ACETAMINOPHEN 325 MG TABLET: 325 | 12 days supply | Qty: 100 | Fill #0

## 2018-02-25 MED FILL — predniSONE 10 MG TABS: 10 | 12 days supply | Qty: 42 | Fill #0

## 2018-02-25 MED FILL — METHOCARBAMOL 500 MG TABLET: 500 | 8 days supply | Qty: 8 | Fill #0

## 2018-03-07 ENCOUNTER — Encounter: Payer: Self-pay | Admitting: Family Medicine

## 2018-03-11 ENCOUNTER — Ambulatory Visit: Payer: 59 | Admitting: Family Medicine

## 2018-03-11 ENCOUNTER — Encounter: Payer: Self-pay | Admitting: Family Medicine

## 2018-03-11 VITALS — BP 134/78 | HR 84 | Resp 14 | Ht 74.0 in

## 2018-03-11 DIAGNOSIS — M545 Low back pain, unspecified: Secondary | ICD-10-CM

## 2018-03-11 MED ORDER — MELOXICAM 15 MG PO TABS: 15.0000 mg | ORAL_TABLET | Freq: Every day | ORAL | 0 refills | Status: DC

## 2018-03-11 MED FILL — MELOXICAM 15 MG TABLET: 15 | 21 days supply | Qty: 21 | Fill #0

## 2018-03-11 NOTE — Patient Instructions (Signed)
OK to take Tylenol 1000 mg (2 extra strength tabs) or 975 mg (3 regular strength tabs) every 6 hours as needed.  Heat (pad or rice pillow in microwave) over affected area, 10-15 minutes twice daily.   EXERCISES  RANGE OF MOTION (ROM) AND STRETCHING EXERCISES - Low Back Pain Most people with lower back pain will find that their symptoms get worse with excessive bending forward (flexion) or arching at the lower back (extension). The exercises that will help resolve your symptoms will focus on the opposite motion.  If you have pain, numbness or tingling which travels down into your buttocks, leg or foot, the goal of the therapy is for these symptoms to move closer to your back and eventually resolve. Sometimes, these leg symptoms will get better, but your lower back pain may worsen. This is often an indication of progress in your rehabilitation. Be very alert to any changes in your symptoms and the activities in which you participated in the 24 hours prior to the change. Sharing this information with your caregiver will allow him or her to most efficiently treat your condition. These exercises may help you when beginning to rehabilitate your injury. Your symptoms may resolve with or without further involvement from your physician, physical therapist or athletic trainer. While completing these exercises, remember:   Restoring tissue flexibility helps normal motion to return to the joints. This allows healthier, less painful movement and activity.  An effective stretch should be held for at least 30 seconds.  A stretch should never be painful. You should only feel a gentle lengthening or release in the stretched tissue. FLEXION RANGE OF MOTION AND STRETCHING EXERCISES:  STRETCH - Flexion, Single Knee to Chest   Lie on a firm bed or floor with both legs extended in front of you.  Keeping one leg in contact with the floor, bring your opposite knee to your chest. Hold your leg in place by either  grabbing behind your thigh or at your knee.  Pull until you feel a gentle stretch in your low back. Hold 30 seconds.  Slowly release your grasp and repeat the exercise with the opposite side. Repeat 2 times. Complete this exercise 3 times per week.   STRETCH - Flexion, Double Knee to Chest  Lie on a firm bed or floor with both legs extended in front of you.  Keeping one leg in contact with the floor, bring your opposite knee to your chest.  Tense your stomach muscles to support your back and then lift your other knee to your chest. Hold your legs in place by either grabbing behind your thighs or at your knees.  Pull both knees toward your chest until you feel a gentle stretch in your low back. Hold 30 seconds.  Tense your stomach muscles and slowly return one leg at a time to the floor. Repeat 2 times. Complete this exercise 3 times per week.   STRETCH - Low Trunk Rotation  Lie on a firm bed or floor. Keeping your legs in front of you, bend your knees so they are both pointed toward the ceiling and your feet are flat on the floor.  Extend your arms out to the side. This will stabilize your upper body by keeping your shoulders in contact with the floor.  Gently and slowly drop both knees together to one side until you feel a gentle stretch in your low back. Hold for 30 seconds.  Tense your stomach muscles to support your lower back as you  bring your knees back to the starting position. Repeat the exercise to the other side. Repeat 2 times. Complete this exercise at least 3 times per week.   EXTENSION RANGE OF MOTION AND FLEXIBILITY EXERCISES:  STRETCH - Extension, Prone on Elbows   Lie on your stomach on the floor, a bed will be too soft. Place your palms about shoulder width apart and at the height of your head.  Place your elbows under your shoulders. If this is too painful, stack pillows under your chest.  Allow your body to relax so that your hips drop lower and make contact  more completely with the floor.  Hold this position for 30 seconds.  Slowly return to lying flat on the floor. Repeat 2 times. Complete this exercise 3 times per week.   RANGE OF MOTION - Extension, Prone Press Ups  Lie on your stomach on the floor, a bed will be too soft. Place your palms about shoulder width apart and at the height of your head.  Keeping your back as relaxed as possible, slowly straighten your elbows while keeping your hips on the floor. You may adjust the placement of your hands to maximize your comfort. As you gain motion, your hands will come more underneath your shoulders.  Hold this position 30 seconds.  Slowly return to lying flat on the floor. Repeat 2 times. Complete this exercise 3 times per week.   RANGE OF MOTION- Quadruped, Neutral Spine   Assume a hands and knees position on a firm surface. Keep your hands under your shoulders and your knees under your hips. You may place padding under your knees for comfort.  Drop your head and point your tailbone toward the ground below you. This will round out your lower back like an angry cat. Hold this position for 30 seconds.  Slowly lift your head and release your tail bone so that your back sags into a large arch, like an old horse.  Hold this position for 30 seconds.  Repeat this until you feel limber in your low back.  Now, find your "sweet spot." This will be the most comfortable position somewhere between the two previous positions. This is your neutral spine. Once you have found this position, tense your stomach muscles to support your low back.  Hold this position for 30 seconds. Repeat 2 times. Complete this exercise 3 times per week.   STRENGTHENING EXERCISES - Low Back Sprain These exercises may help you when beginning to rehabilitate your injury. These exercises should be done near your "sweet spot." This is the neutral, low-back arch, somewhere between fully rounded and fully arched, that is your  least painful position. When performed in this safe range of motion, these exercises can be used for people who have either a flexion or extension based injury. These exercises may resolve your symptoms with or without further involvement from your physician, physical therapist or athletic trainer. While completing these exercises, remember:   Muscles can gain both the endurance and the strength needed for everyday activities through controlled exercises.  Complete these exercises as instructed by your physician, physical therapist or athletic trainer. Increase the resistance and repetitions only as guided.  You may experience muscle soreness or fatigue, but the pain or discomfort you are trying to eliminate should never worsen during these exercises. If this pain does worsen, stop and make certain you are following the directions exactly. If the pain is still present after adjustments, discontinue the exercise until you can discuss  the trouble with your caregiver.  STRENGTHENING - Deep Abdominals, Pelvic Tilt   Lie on a firm bed or floor. Keeping your legs in front of you, bend your knees so they are both pointed toward the ceiling and your feet are flat on the floor.  Tense your lower abdominal muscles to press your low back into the floor. This motion will rotate your pelvis so that your tail bone is scooping upwards rather than pointing at your feet or into the floor. With a gentle tension and even breathing, hold this position for 3 seconds. Repeat 2 times. Complete this exercise 3 times per week.   STRENGTHENING - Abdominals, Crunches   Lie on a firm bed or floor. Keeping your legs in front of you, bend your knees so they are both pointed toward the ceiling and your feet are flat on the floor. Cross your arms over your chest.  Slightly tip your chin down without bending your neck.  Tense your abdominals and slowly lift your trunk high enough to just clear your shoulder blades. Lifting  higher can put excessive stress on the lower back and does not further strengthen your abdominal muscles.  Control your return to the starting position. Repeat 2 times. Complete this exercise 3 times per week.   STRENGTHENING - Quadruped, Opposite UE/LE Lift   Assume a hands and knees position on a firm surface. Keep your hands under your shoulders and your knees under your hips. You may place padding under your knees for comfort.  Find your neutral spine and gently tense your abdominal muscles so that you can maintain this position. Your shoulders and hips should form a rectangle that is parallel with the floor and is not twisted.  Keeping your trunk steady, lift your right hand no higher than your shoulder and then your left leg no higher than your hip. Make sure you are not holding your breath. Hold this position for 30 seconds.  Continuing to keep your abdominal muscles tense and your back steady, slowly return to your starting position. Repeat with the opposite arm and leg. Repeat 2 times. Complete this exercise 3 times per week.   STRENGTHENING - Abdominals and Quadriceps, Straight Leg Raise   Lie on a firm bed or floor with both legs extended in front of you.  Keeping one leg in contact with the floor, bend the other knee so that your foot can rest flat on the floor.  Find your neutral spine, and tense your abdominal muscles to maintain your spinal position throughout the exercise.  Slowly lift your straight leg off the floor about 6 inches for a count of 3, making sure to not hold your breath.  Still keeping your neutral spine, slowly lower your leg all the way to the floor. Repeat this exercise with each leg 2 times. Complete this exercise 3 times per week.  POSTURE AND BODY MECHANICS CONSIDERATIONS - Low Back Sprain Keeping correct posture when sitting, standing or completing your activities will reduce the stress put on different body tissues, allowing injured tissues a  chance to heal and limiting painful experiences. The following are general guidelines for improved posture.  While reading these guidelines, remember:  The exercises prescribed by your provider will help you have the flexibility and strength to maintain correct postures.  The correct posture provides the best environment for your joints to work. All of your joints have less wear and tear when properly supported by a spine with good posture. This means you  will experience a healthier, less painful body.  Correct posture must be practiced with all of your activities, especially prolonged sitting and standing. Correct posture is as important when doing repetitive low-stress activities (typing) as it is when doing a single heavy-load activity (lifting).  RESTING POSITIONS Consider which positions are most painful for you when choosing a resting position. If you have pain with flexion-based activities (sitting, bending, stooping, squatting), choose a position that allows you to rest in a less flexed posture. You would want to avoid curling into a fetal position on your side. If your pain worsens with extension-based activities (prolonged standing, working overhead), avoid resting in an extended position such as sleeping on your stomach. Most people will find more comfort when they rest with their spine in a more neutral position, neither too rounded nor too arched. Lying on a non-sagging bed on your side with a pillow between your knees, or on your back with a pillow under your knees will often provide some relief. Keep in mind, being in any one position for a prolonged period of time, no matter how correct your posture, can still lead to stiffness.  PROPER SITTING POSTURE In order to minimize stress and discomfort on your spine, you must sit with correct posture. Sitting with good posture should be effortless for a healthy body. Returning to good posture is a gradual process. Many people can work toward this  most comfortably by using various supports until they have the flexibility and strength to maintain this posture on their own. When sitting with proper posture, your ears will fall over your shoulders and your shoulders will fall over your hips. You should use the back of the chair to support your upper back. Your lower back will be in a neutral position, just slightly arched. You may place a small pillow or folded towel at the base of your lower back for  support.  When working at a desk, create an environment that supports good, upright posture. Without extra support, muscles tire, which leads to excessive strain on joints and other tissues. Keep these recommendations in mind:  CHAIR:  A chair should be able to slide under your desk when your back makes contact with the back of the chair. This allows you to work closely.  The chair's height should allow your eyes to be level with the upper part of your monitor and your hands to be slightly lower than your elbows.  BODY POSITION  Your feet should make contact with the floor. If this is not possible, use a foot rest.  Keep your ears over your shoulders. This will reduce stress on your neck and low back.  INCORRECT SITTING POSTURES  If you are feeling tired and unable to assume a healthy sitting posture, do not slouch or slump. This puts excessive strain on your back tissues, causing more damage and pain. Healthier options include:  Using more support, like a lumbar pillow.  Switching tasks to something that requires you to be upright or walking.  Talking a brief walk.  Lying down to rest in a neutral-spine position.  PROLONGED STANDING WHILE SLIGHTLY LEANING FORWARD  When completing a task that requires you to lean forward while standing in one place for a long time, place either foot up on a stationary 2-4 inch high object to help maintain the best posture. When both feet are on the ground, the lower back tends to lose its slight inward  curve. If this curve flattens (or becomes too  large), then the back and your other joints will experience too much stress, tire more quickly, and can cause pain.  CORRECT STANDING POSTURES Proper standing posture should be assumed with all daily activities, even if they only take a few moments, like when brushing your teeth. As in sitting, your ears should fall over your shoulders and your shoulders should fall over your hips. You should keep a slight tension in your abdominal muscles to brace your spine. Your tailbone should point down to the ground, not behind your body, resulting in an over-extended swayback posture.   INCORRECT STANDING POSTURES  Common incorrect standing postures include a forward head, locked knees and/or an excessive swayback. WALKING Walk with an upright posture. Your ears, shoulders and hips should all line-up.  PROLONGED ACTIVITY IN A FLEXED POSITION When completing a task that requires you to bend forward at your waist or lean over a low surface, try to find a way to stabilize 3 out of 4 of your limbs. You can place a hand or elbow on your thigh or rest a knee on the surface you are reaching across. This will provide you more stability, so that your muscles do not tire as quickly. By keeping your knees relaxed, or slightly bent, you will also reduce stress across your lower back. CORRECT LIFTING TECHNIQUES  DO :  Assume a wide stance. This will provide you more stability and the opportunity to get as close as possible to the object which you are lifting.  Tense your abdominals to brace your spine. Bend at the knees and hips. Keeping your back locked in a neutral-spine position, lift using your leg muscles. Lift with your legs, keeping your back straight.  Test the weight of unknown objects before attempting to lift them.  Try to keep your elbows locked down at your sides in order get the best strength from your shoulders when carrying an object.     Always ask  for help when lifting heavy or awkward objects. INCORRECT LIFTING TECHNIQUES DO NOT:   Lock your knees when lifting, even if it is a small object.  Bend and twist. Pivot at your feet or move your feet when needing to change directions.  Assume that you can safely pick up even a paperclip without proper posture.

## 2018-03-11 NOTE — Progress Notes (Signed)
Musculoskeletal Exam  Patient: Peter Camacho DOB: [birth year]  DOS: 03/11/2018  SUBJECTIVE:  Chief Complaint:   Chief Complaint  Patient presents with  . Back Pain    Peter Camacho is a 58 y.o.  male for evaluation and treatment of R thigh pain.   Onset:  1 month ago. No inj or change in activity.  Location: posterior R thigh Character:  aching and tightening  Progression of issue:  has slightly improved Associated symptoms: feels like it is swollen Treatment: to date has been acetaminophen, oral steroids and muscle relaxers.   Worse when he bears weight, no pain when he touches.  Feels better when he leans forward or sits.  Neurovascular symptoms: no DVT ruled out in ED.  ROS: Musculoskeletal/Extremities: +R thigh pain  Past Medical History:  Diagnosis Date  . At risk for sleep apnea    STOP-BANG= 5    SENT TO PCP 03-17-2014  . Hard of hearing   . History of seizure    HX menigitis x2  in middle and high school - residual seizure's on medications--  last seizure age 64  . Hypertension   . Personal history of meningitis    X2  MIDDLE AND HIGH SCHOOL--  residual seizure's on medication -- last seizure age 13  . Prostate cancer ([place]) MONITORED BY DR OTTELIN   stage T1c, Gleason 3+4, PSA 12.79, vol 38.5cc    Objective: VITAL SIGNS: BP 134/78 (BP Location: Left Arm, Patient Position: Sitting, Cuff Size: Large)   Pulse 84   Resp 14   Ht 6\' 2"  (1.88 m)   BMI 40.70 kg/m  Constitutional: Well formed, well developed. No acute distress. Thorax & Lungs: No accessory muscle use Musculoskeletal: Low back   Normal active range of motion: yes.   Normal passive range of motion: yes Tenderness to palpation: no Deformity: no Ecchymosis: no Tests positive: No Tests negative: Stinchfield, straight leg Poor hamstring ROM Neurologic: Normal sensory function. No focal deficits noted. DTR's equal and symmetry in LE's. No clonus. Psychiatric: Normal mood. Age appropriate  judgment and insight. Alert & oriented x 3.    Assessment:  Acute right-sided low back pain without sciatica - Plan: meloxicam (MOBIC) 15 MG tablet  Plan: Orders as above. Supportive shoes rec'd. Stretches/exercises, heat, Tylenol.  F/u in 4 weeks if no improvement, will consider PT. The patient voiced understanding and agreement to the plan.   [place], DO 03/11/18  3:01 PM

## 2018-04-12 ENCOUNTER — Encounter: Payer: Self-pay | Admitting: Family Medicine

## 2018-04-12 ENCOUNTER — Ambulatory Visit (HOSPITAL_BASED_OUTPATIENT_CLINIC_OR_DEPARTMENT_OTHER)
Admission: RE | Admit: 2018-04-12 | Discharge: 2018-04-12 | Disposition: A | Discharge status: Home / Self Care | Payer: 59 | Source: Ambulatory Visit | Attending: Family Medicine | Admitting: Family Medicine

## 2018-04-12 ENCOUNTER — Other Ambulatory Visit: Payer: Self-pay | Admitting: Family Medicine

## 2018-04-12 ENCOUNTER — Ambulatory Visit: Payer: 59 | Admitting: Family Medicine

## 2018-04-12 ENCOUNTER — Telehealth: Payer: Self-pay | Admitting: Family Medicine

## 2018-04-12 VITALS — BP 124/82 | HR 84 | Temp 97.9°F | Ht 74.0 in | Wt 323.1 lb

## 2018-04-12 DIAGNOSIS — M25551 Pain in right hip: Secondary | ICD-10-CM | POA: Insufficient documentation

## 2018-04-12 DIAGNOSIS — M79651 Pain in right thigh: Secondary | ICD-10-CM

## 2018-04-12 MED ORDER — DICLOFENAC SODIUM 75 MG PO TBEC: 75.0000 mg | DELAYED_RELEASE_TABLET | Freq: Two times a day (BID) | ORAL | 0 refills | Status: DC

## 2018-04-12 MED FILL — DICLOFENAC SODIUM 75 MG TAB: 75 | 15 days supply | Qty: 30 | Fill #0

## 2018-04-12 NOTE — Telephone Encounter (Signed)
Are we sure? That's what I called in a month ago. TY.

## 2018-04-12 NOTE — Telephone Encounter (Signed)
The patient called back to have PCP send medication to [facility]---also states if there is something stronger than meloxicam for his leg he would appreciate that

## 2018-04-12 NOTE — Progress Notes (Signed)
Chief Complaint  Patient presents with  . Leg Pain    right    Subjective: Patient is a 58 y.o. male here for f/u leg pain.  He was seen 1 month ago and given low back stretches and exercises for suspected radiation.  Mobic was also unhelpful.  He was compliant with this but did not notice any improvement.  No new signs or symptoms.  He does not have a history of arthritis.  ROS: MSK: As noted in HPI  Past Medical History:  Diagnosis Date  . At risk for sleep apnea    STOP-BANG= 5    SENT TO PCP 03-17-2014  . Hard of hearing   . History of seizure    HX menigitis x2  in middle and high school - residual seizure's on medications--  last seizure age 22  . Hypertension   . Personal history of meningitis    X2  MIDDLE AND HIGH SCHOOL--  residual seizure's on medication -- last seizure age 58  . Prostate cancer ([place]) MONITORED BY DR OTTELIN   stage T1c, Gleason 3+4, PSA 12.79, vol 38.5cc    Objective: BP 124/82 (BP Location: Left Arm, Patient Position: Sitting, Cuff Size: Large)   Pulse 84   Temp 97.9 F (36.6 C) (Oral)   Ht 6\' 2"  (1.88 m)   Wt (!) 323 lb 2 oz (146.6 kg)   SpO2 94%   BMI 41.49 kg/m  General: Awake, appears stated age MSK: mild ttp over anterior inguinal region and quad Lungs: No accessory muscle use Psych: Age appropriate judgment and insight, normal affect and mood  Assessment and Plan: Pain of right hip joint - Plan: DG HIP UNILAT WITH PELVIS 2-3 VIEWS RIGHT, CANCELED: XR HIP UNILAT W OR W/O PELVIS 2-3 VIEWS RIGHT  Orders as above. Due to groin pain, will ck XR. If neg, will set up with PT. Quad stretches/exercises also given.  He would prefer not to use benchmark PT. F/u prn The patient voiced understanding and agreement to the plan.  [place], DO 04/12/18  10:36 AM

## 2018-04-12 NOTE — Telephone Encounter (Signed)
Copied from [facility] [id number]. Topic: General - Other >> Apr 12, 2018 11:40 AM Yvette Rack wrote: Reason for CRM: Pt stated he was asked to call the office back with the name of a medication that he could not remember while in the office. Pt stated the name of the medication is meloxicam.   Spoke to him and he does think that was it.  He states he used for pain and would like sent to pharmacy if possible

## 2018-04-12 NOTE — Patient Instructions (Addendum)
We will be in touch regarding your X-ray.  We may set you up with PT depending on the result.  Heat (pad or rice pillow in microwave) over affected area, 10-15 minutes twice daily.   Quadriceps Strain Rehab It is normal to feel mild stretching, pulling, tightness, or discomfort as you do these exercises, but you should stop right away if you feel sudden pain or your pain gets worse. Stretching and range of motion exercises These exercises warm up your muscles and joints and improve the movement and flexibility of your thigh. These exercises can also help to relieve stiffness or swelling. Exercise A: Heel slides   1. Lie on your back with both knees straight. If this causes back discomfort, bend the knee of your healthy leg, placing your foot flat on the floor. 2. Slowly slide your left / right heel back toward your buttocks until you feel a gentle stretch in the front of your knee or thigh. 3. Hold for 30 seconds. Then slowly slide your heel back to the starting position. Repeat 2 times. Complete this exercise 3 times a week. Exercise B: Quadriceps stretch, prone   1. Lie on your abdomen on a firm surface, such as a bed or padded floor. 2. Bend your left / right knee and hold your ankle. If you cannot reach your ankle or pant leg, loop a belt around your foot and grab the belt instead. 3. Gently pull your heel toward your buttocks. Your knee should not slide out to the side. You should feel a stretch in the front of your thigh and knee. 4. Hold this position for 30 seconds. Repeat 2 times. Complete this exercise 3 times a week. Strengthening exercises These exercises build strength and endurance in your thigh. Endurance is the ability to use your muscles for a long time, even after your muscles get tired. Exercise C: Straight leg raises (quadriceps and hip flexors) Quality counts! Watch for signs that the quadriceps muscle is working to ensure that you are strengthening the correct  muscles and not cheating by using healthier muscles. 1. Lie on your back with your left / right leg extended and your other knee bent. 2. Tense the muscles in the front of your left / right thigh. You should see your kneecap slide up or see increased dimpling just above the knee. 3. Tighten these muscles even more and raise your leg 4-6 inches (10-15 cm) off the floor. 4. Hold for 3 seconds. 5. Keep the thigh muscles tense as you lower your leg. 6. Relax the muscles slowly and completely after each repetition. Repeat 2 times. Complete this exercise 3 times a week. Exercise D: Straight leg raises (hip extensors) 1. Lie on your belly on a bed or a firm surface with a pillow under your hips. 2. Bend your left / right knee so your foot is straight up in the air. 3. Tense your buttock muscles and lift your left / right thigh off the bed. Do not let your back arch. 4. Hold this position for 3 seconds. 5. Slowly return to the starting position. Let your muscles relax completely before doing another repetition. Repeat 2 times. Complete this exercise 3 times a week. Exercise E: Wall sits   Follow the directions for form closely. If you do not place your feet and knees properly, this can lead to knee pain. 1. Lean back against a smooth wall or door and walk your feet out 18-24 inches (46-61 cm) from it. Place your  feet hip-width apart. 2. Slowly slide down the wall or door until your knees bend  60-90 degrees. Keep your weight back and over your heels, not over your toes. Keep your thighs straight or pointing slightly outward. 3. Hold for 1 second. 4. Use your thigh and buttock muscles to push you back up to a standing position. Keep your weight through your heels while you do this. 5. Rest for 5 seconds in between repetitions. Repeat 2 times. Complete this exercise 3 times a week. Make sure you discuss any questions you have with your health care provider. Document Released: 02/13/2005 Document  Revised: 10/21/2015 Document Reviewed: 11/17/2014 Elsevier Interactive Patient Education  [employer].

## 2018-04-22 ENCOUNTER — Other Ambulatory Visit: Payer: Self-pay | Admitting: Family Medicine

## 2018-04-22 DIAGNOSIS — I10 Essential (primary) hypertension: Secondary | ICD-10-CM

## 2018-05-02 DIAGNOSIS — M9903 Segmental and somatic dysfunction of lumbar region: Secondary | ICD-10-CM | POA: Diagnosis not present

## 2018-05-02 DIAGNOSIS — S39012A Strain of muscle, fascia and tendon of lower back, initial encounter: Secondary | ICD-10-CM | POA: Diagnosis not present

## 2018-05-02 DIAGNOSIS — M5126 Other intervertebral disc displacement, lumbar region: Secondary | ICD-10-CM | POA: Diagnosis not present

## 2018-05-06 DIAGNOSIS — M5126 Other intervertebral disc displacement, lumbar region: Secondary | ICD-10-CM | POA: Diagnosis not present

## 2018-05-06 DIAGNOSIS — S39012A Strain of muscle, fascia and tendon of lower back, initial encounter: Secondary | ICD-10-CM | POA: Diagnosis not present

## 2018-05-06 DIAGNOSIS — M9903 Segmental and somatic dysfunction of lumbar region: Secondary | ICD-10-CM | POA: Diagnosis not present

## 2018-05-09 DIAGNOSIS — M9903 Segmental and somatic dysfunction of lumbar region: Secondary | ICD-10-CM | POA: Diagnosis not present

## 2018-05-09 DIAGNOSIS — S39012A Strain of muscle, fascia and tendon of lower back, initial encounter: Secondary | ICD-10-CM | POA: Diagnosis not present

## 2018-05-09 DIAGNOSIS — M5126 Other intervertebral disc displacement, lumbar region: Secondary | ICD-10-CM | POA: Diagnosis not present

## 2018-05-13 DIAGNOSIS — M5126 Other intervertebral disc displacement, lumbar region: Secondary | ICD-10-CM | POA: Diagnosis not present

## 2018-05-13 DIAGNOSIS — M9903 Segmental and somatic dysfunction of lumbar region: Secondary | ICD-10-CM | POA: Diagnosis not present

## 2018-05-13 DIAGNOSIS — S39012A Strain of muscle, fascia and tendon of lower back, initial encounter: Secondary | ICD-10-CM | POA: Diagnosis not present

## 2018-05-14 DIAGNOSIS — S39012A Strain of muscle, fascia and tendon of lower back, initial encounter: Secondary | ICD-10-CM | POA: Diagnosis not present

## 2018-05-14 DIAGNOSIS — M5126 Other intervertebral disc displacement, lumbar region: Secondary | ICD-10-CM | POA: Diagnosis not present

## 2018-05-14 DIAGNOSIS — M9903 Segmental and somatic dysfunction of lumbar region: Secondary | ICD-10-CM | POA: Diagnosis not present

## 2018-05-16 DIAGNOSIS — M9903 Segmental and somatic dysfunction of lumbar region: Secondary | ICD-10-CM | POA: Diagnosis not present

## 2018-05-16 DIAGNOSIS — S39012A Strain of muscle, fascia and tendon of lower back, initial encounter: Secondary | ICD-10-CM | POA: Diagnosis not present

## 2018-05-16 DIAGNOSIS — M5126 Other intervertebral disc displacement, lumbar region: Secondary | ICD-10-CM | POA: Diagnosis not present

## 2018-05-20 DIAGNOSIS — S39012A Strain of muscle, fascia and tendon of lower back, initial encounter: Secondary | ICD-10-CM | POA: Diagnosis not present

## 2018-05-20 DIAGNOSIS — M9903 Segmental and somatic dysfunction of lumbar region: Secondary | ICD-10-CM | POA: Diagnosis not present

## 2018-05-20 DIAGNOSIS — M5126 Other intervertebral disc displacement, lumbar region: Secondary | ICD-10-CM | POA: Diagnosis not present

## 2018-05-23 DIAGNOSIS — M9903 Segmental and somatic dysfunction of lumbar region: Secondary | ICD-10-CM | POA: Diagnosis not present

## 2018-05-23 DIAGNOSIS — S39012A Strain of muscle, fascia and tendon of lower back, initial encounter: Secondary | ICD-10-CM | POA: Diagnosis not present

## 2018-05-23 DIAGNOSIS — M5126 Other intervertebral disc displacement, lumbar region: Secondary | ICD-10-CM | POA: Diagnosis not present

## 2018-05-27 DIAGNOSIS — M9903 Segmental and somatic dysfunction of lumbar region: Secondary | ICD-10-CM | POA: Diagnosis not present

## 2018-05-27 DIAGNOSIS — S39012A Strain of muscle, fascia and tendon of lower back, initial encounter: Secondary | ICD-10-CM | POA: Diagnosis not present

## 2018-05-27 DIAGNOSIS — M5126 Other intervertebral disc displacement, lumbar region: Secondary | ICD-10-CM | POA: Diagnosis not present

## 2018-05-30 DIAGNOSIS — M5126 Other intervertebral disc displacement, lumbar region: Secondary | ICD-10-CM | POA: Diagnosis not present

## 2018-05-30 DIAGNOSIS — S39012A Strain of muscle, fascia and tendon of lower back, initial encounter: Secondary | ICD-10-CM | POA: Diagnosis not present

## 2018-05-30 DIAGNOSIS — M9903 Segmental and somatic dysfunction of lumbar region: Secondary | ICD-10-CM | POA: Diagnosis not present

## 2018-06-03 DIAGNOSIS — M5126 Other intervertebral disc displacement, lumbar region: Secondary | ICD-10-CM | POA: Diagnosis not present

## 2018-06-03 DIAGNOSIS — M9903 Segmental and somatic dysfunction of lumbar region: Secondary | ICD-10-CM | POA: Diagnosis not present

## 2018-06-03 DIAGNOSIS — S39012A Strain of muscle, fascia and tendon of lower back, initial encounter: Secondary | ICD-10-CM | POA: Diagnosis not present

## 2018-06-04 ENCOUNTER — Other Ambulatory Visit: Payer: Self-pay | Admitting: Family Medicine

## 2018-06-04 DIAGNOSIS — J302 Other seasonal allergic rhinitis: Secondary | ICD-10-CM

## 2018-07-01 ENCOUNTER — Encounter: Payer: 59 | Admitting: Family Medicine

## 2018-09-09 ENCOUNTER — Other Ambulatory Visit: Payer: Self-pay

## 2018-09-09 ENCOUNTER — Telehealth: Payer: Self-pay | Admitting: Family Medicine

## 2018-09-09 DIAGNOSIS — J302 Other seasonal allergic rhinitis: Secondary | ICD-10-CM

## 2018-09-09 MED ORDER — ALBUTEROL SULFATE HFA 108 (90 BASE) MCG/ACT IN AERS: INHALATION_SPRAY | RESPIRATORY_TRACT | 2 refills | Status: DC

## 2018-09-09 MED FILL — VENTOLIN HFA 90 MCG INHALER: 108 (90 BAS | 25 days supply | Qty: 18 | Fill #0

## 2018-09-09 NOTE — Telephone Encounter (Signed)
Sent in

## 2018-09-09 NOTE — Telephone Encounter (Signed)
Pt states that he lost his albuterol inhaler when he recently went to a funeral in [facility]. Pt out of refills. Please send to [facility].

## 2018-09-15 ENCOUNTER — Other Ambulatory Visit: Payer: Self-pay | Admitting: Family Medicine

## 2018-09-15 DIAGNOSIS — J302 Other seasonal allergic rhinitis: Secondary | ICD-10-CM

## 2018-09-30 ENCOUNTER — Encounter: Payer: Self-pay | Admitting: Family Medicine

## 2018-09-30 ENCOUNTER — Ambulatory Visit (INDEPENDENT_AMBULATORY_CARE_PROVIDER_SITE_OTHER): Payer: 59 | Admitting: Family Medicine

## 2018-09-30 ENCOUNTER — Other Ambulatory Visit: Payer: Self-pay

## 2018-09-30 VITALS — BP 124/84 | HR 93 | Temp 97.8°F | Ht 74.0 in | Wt 317.4 lb

## 2018-09-30 DIAGNOSIS — M5431 Sciatica, right side: Secondary | ICD-10-CM | POA: Insufficient documentation

## 2018-09-30 DIAGNOSIS — Z0001 Encounter for general adult medical examination with abnormal findings: Secondary | ICD-10-CM | POA: Diagnosis not present

## 2018-09-30 DIAGNOSIS — G5701 Lesion of sciatic nerve, right lower limb: Secondary | ICD-10-CM

## 2018-09-30 DIAGNOSIS — Z0000 Encounter for general adult medical examination without abnormal findings: Secondary | ICD-10-CM

## 2018-09-30 LAB — CBC
HCT: 41.4 % (ref 39.0–52.0)
Hemoglobin: 13.9 g/dL (ref 13.0–17.0)
MCHC: 33.6 g/dL (ref 30.0–36.0)
MCV: 91.4 fl (ref 78.0–100.0)
Platelets: 291 10*3/uL (ref 150.0–400.0)
RBC: 4.53 Mil/uL (ref 4.22–5.81)
RDW: 15.1 % (ref 11.5–15.5)
WBC: 7.2 10*3/uL (ref 4.0–10.5)

## 2018-09-30 LAB — COMPREHENSIVE METABOLIC PANEL
ALT: 17 U/L (ref 0–53)
AST: 17 U/L (ref 0–37)
Albumin: 3.8 g/dL (ref 3.5–5.2)
Alkaline Phosphatase: 107 U/L (ref 39–117)
BUN: 12 mg/dL (ref 6–23)
CO2: 28 mEq/L (ref 19–32)
Calcium: 8.8 mg/dL (ref 8.4–10.5)
Chloride: 99 mEq/L (ref 96–112)
Creatinine, Ser: 0.88 mg/dL (ref 0.40–1.50)
GFR: 107.25 mL/min (ref >60.00)
Glucose, Bld: 98 mg/dL (ref 70–99)
Potassium: 3.6 mEq/L (ref 3.5–5.1)
Sodium: 137 mEq/L (ref 135–145)
Total Bilirubin: 0.3 mg/dL (ref 0.2–1.2)
Total Protein: 7.1 g/dL (ref 6.0–8.3)

## 2018-09-30 LAB — LIPID PANEL
Cholesterol: 178 mg/dL (ref 0–200)
HDL: 71.5 mg/dL (ref >39.00)
LDL Cholesterol: 94 mg/dL (ref 0–99)
NonHDL: 106.14
Total CHOL/HDL Ratio: 2
Triglycerides: 61 mg/dL (ref 0.0–149.0)
VLDL: 12.2 mg/dL (ref 0.0–40.0)

## 2018-09-30 NOTE — Patient Instructions (Addendum)
Give [place] 2-3 business days to get the results of your labs back.   Keep the diet clean and stay active.  The new Shingrix vaccine (for shingles) is a 2 shot series. It can make people feel low energy, achy and almost like they have the flu for 48 hours after injection. Please plan accordingly when deciding on when to get this shot. Call our office for a nurse visit appointment to get this. The second shot of the series is less severe regarding the side effects, but it still lasts 48 hours.   Call the GI team for your colonoscopy- [phone].  If you don't improve with your hip/buttock area in the next several weeks, let me know and we will set up physical therapy. I don't think injections will be helpful with this one.   Let us know if you need anything.  Piriformis Syndrome Rehab It is normal to feel mild stretching, pulling, tightness, or discomfort as you do these exercises, but you should stop right away if you feel sudden pain or your pain gets worse.  Stretching and range of motion exercises These exercises warm up your muscles and joints and improve the movement and flexibility of your hip and pelvis. These exercises also help to relieve pain, numbness, and tingling. Exercise A: Hip rotators   1. Lie on your back on a firm surface. 2. Pull your left / right knee toward your same shoulder with your left / right hand until your knee is pointing toward the ceiling. Hold your left / right ankle with your other hand. 3. Keeping your knee steady, gently pull your left / right ankle toward your other shoulder until you feel a stretch in your buttocks. 4. Hold this position for 30 seconds. Repeat 2 times. Complete this stretch 3 times per week. Exercise B: Hip extensors 1. Lie on your back on a firm surface. Both of your legs should be straight. 2. Pull your left / right knee to your chest. Hold your leg in this position by holding onto the back of your thigh or the front of your  knee. 3. Hold this position for 30 seconds. 4. Slowly return to the starting position. Repeat 2 times. Complete this stretch 3 times per week.  Strengthening exercises These exercises build strength and endurance in your hip and thigh muscles. Endurance is the ability to use your muscles for a long time, even after they get tired. Exercise C: Straight leg raises (hip abductors)    1. Lie on your side with your left / right leg in the top position. Lie so your head, shoulder, knee, and hip line up. Bend your bottom knee to help you balance. 2. Lift your top leg up 4-6 inches (10-15 cm), keeping your toes pointed straight ahead. 3. Hold this position for 1 second. 4. Slowly lower your leg to the starting position. Let your muscles relax completely. Repeat for a total of 10 repetitions. Repeat 2 times. Complete this exercise 3 times per week. Exercise D: Hip abductors and rotators, quadruped   1. Get on your hands and knees on a firm, lightly padded surface. Your hands should be directly below your shoulders, and your knees should be directly below your hips. 2. Lift your left / right knee out to the side. Keep your knee bent. Do not twist your body. 3. Hold this position for 1 seconds. 4. Slowly lower your leg. Repeat for a total of 10 repetitions.  Repeat 1 times. Complete this exercise  3 times per week. Exercise E: Straight leg raises (hip extensors) 1. Lie on your abdomen on a bed or a firm surface with a pillow under your hips. 2. Squeeze your buttock muscles and lift your left / right thigh off the bed. Do not let your back arch. 3. Hold this position for 3 seconds. 4. Slowly return to the starting position. Let your muscles relax completely before doing another repetition. Repeat 2 times. Complete this exercise 3 times per week.  This information is not intended to replace advice given to you by your health care provider. Make sure you discuss any questions you have with your  health care provider. Document Released: 02/13/2005 Document Revised: 10/19/2015 Document Reviewed: 01/26/2015 Elsevier Interactive Patient Education  [employer].

## 2018-09-30 NOTE — Progress Notes (Signed)
Chief Complaint  Patient presents with  . Follow-up    Well Male Peter Camacho is here for a complete physical.   His last physical was >1 year ago.  Current diet: in general, diet could be better  Current exercise: walking Weight trend: has lost a few lbs Daytime fatigue? No. Seat belt? Yes.    Health maintenance Shingrix- No Colonoscopy- No Tetanus- Yes HIV- Yes Hep C- Yes Prostate cancer screening- hx of prostate cancer   Past Medical History:  Diagnosis Date  . At risk for sleep apnea    STOP-BANG= 5    SENT TO PCP 03-17-2014  . Hard of hearing   . History of seizure    HX menigitis x2  in middle and high school - residual seizure's on medications--  last seizure age 34  . Hypertension   . Personal history of meningitis    X2  MIDDLE AND HIGH SCHOOL--  residual seizure's on medication -- last seizure age 63  . Prostate cancer ([place]) MONITORED BY DR OTTELIN   stage T1c, Gleason 3+4, PSA 12.79, vol 38.5cc      Past Surgical History:  Procedure Laterality Date  . PROSTATE BIOPSY    . RADIOACTIVE SEED IMPLANT N/A 03/20/2014   Procedure: RADIOACTIVE SEED IMPLANT;  Surgeon: Claybon Jabs, MD;  Location: [facility];  Service: Urology;  Laterality: N/A;    Medications  Current Outpatient Medications on File Prior to Visit  Medication Sig Dispense Refill  . acetaminophen (TYLENOL) 325 MG tablet Take 2 tablets (650 mg total) by mouth every 6 (six) hours as needed. 30 tablet 0  . albuterol (VENTOLIN HFA) 108 (90 Base) MCG/ACT inhaler TAKE 2 PUFFS BY MOUTH EVERY 6 HOURS AS NEEDED FOR WHEEZE OR SHORTNESS OF BREATH 18 g 2  . lisinopril-hydrochlorothiazide (PRINZIDE,ZESTORETIC) 20-25 MG tablet TAKE 1 TABLET BY MOUTH EVERY DAY 90 tablet 1   Allergies Allergies  Allergen Reactions  . Fish-Derived Products Rash    Family History Family History  Problem Relation Age of Onset  . CAD Mother 53  . Diabetes Mother   . CAD Father 30  . Cancer Neg Hx      Review of Systems: Constitutional:  no fevers Eye:  no recent significant change in vision Ear/Nose/Mouth/Throat:  Ears:  no hearing loss Nose/Mouth/Throat:  no complaints of nasal congestion, no sore throat Cardiovascular:  no chest pain, no palpitations Respiratory:  no cough and no shortness of breath Gastrointestinal:  no abdominal pain, no change in bowel habits GU:  Male: negative for dysuria, frequency, and incontinence and negative for prostate symptoms Musculoskeletal/Extremities:  no pain, redness, or swelling of the joints Integumentary (Skin/Breast):  no abnormal skin lesions reported Neurologic:  no headaches Endocrine: No unexpected weight changes Hematologic/Lymphatic:  no abnormal bleeding  Exam BP 124/84 (BP Location: Left Arm, Patient Position: Sitting, Cuff Size: Large)   Pulse 93   Temp 97.8 F (36.6 C) (Oral)   Ht 6\' 2"  (1.88 m)   Wt (!) 317 lb 6 oz (144 kg)   SpO2 95%   BMI 40.75 kg/m  General:  well developed, well nourished, in no apparent distress Skin:  no significant moles, warts, or growths Head:  no masses, lesions, or tenderness Eyes:  pupils equal and round, sclera anicteric without injection Ears: Hard of hearing; canals without lesions, TMs shiny without retraction, no obvious effusion, no erythema Nose:  nares patent, septum midline, mucosa normal Throat/Pharynx:  lips and gingiva without lesion; tongue  and uvula midline; non-inflamed pharynx; no exudates or postnasal drainage Neck: neck supple without adenopathy, thyromegaly, or masses Lungs:  clear to auscultation, breath sounds equal bilaterally, no respiratory distress Cardio:  regular rate and rhythm, no LE edema, no bruits Abdomen:  abdomen soft, nontender; bowel sounds normal; no masses or organomegaly Rectal: Deferred Musculoskeletal: pain with hip flexion and adduction; no ttp over lumbar spine msk, neg straight leg; symmetrical muscle groups noted without atrophy or  deformity Extremities:  no clubbing, cyanosis, or edema, no deformities, no skin discoloration Neuro:  gait normal; deep tendon reflexes normal and symmetric Psych: well oriented with normal range of affect and appropriate judgment/insight  Assessment and Plan  Well adult exam - Plan: CBC, Comprehensive metabolic panel, Lipid panel  Piriformis syndrome of right side - Plan: home stretches/exercises. If no improvement over next several weeks, will let us know and we will set you up with PT.    Well 58 y.o. male. Counseled on diet and exercise. Pt w hx of prostate cancer. Has yearly appts w urology team. Pt given [facility] info.  Immunizations, labs, and further orders as above. Follow up in 6 mo or prn. The patient voiced understanding and agreement to the plan.  [place], DO 09/30/18 8:04 AM

## 2018-10-19 ENCOUNTER — Other Ambulatory Visit: Payer: Self-pay | Admitting: Family Medicine

## 2018-10-19 DIAGNOSIS — I10 Essential (primary) hypertension: Secondary | ICD-10-CM

## 2018-10-22 ENCOUNTER — Encounter: Payer: Self-pay | Admitting: Family Medicine

## 2018-10-22 ENCOUNTER — Ambulatory Visit: Payer: 59 | Admitting: Family Medicine

## 2018-10-22 ENCOUNTER — Other Ambulatory Visit: Payer: Self-pay

## 2018-10-22 VITALS — BP 140/82 | HR 128 | Temp 96.7°F | Ht 74.0 in | Wt 323.4 lb

## 2018-10-22 DIAGNOSIS — T300 Burn of unspecified body region, unspecified degree: Secondary | ICD-10-CM | POA: Diagnosis not present

## 2018-10-22 DIAGNOSIS — L0292 Furuncle, unspecified: Secondary | ICD-10-CM | POA: Diagnosis not present

## 2018-10-22 MED ORDER — DOXYCYCLINE HYCLATE 100 MG PO TABS: 100.0000 mg | ORAL_TABLET | Freq: Two times a day (BID) | ORAL | 0 refills | Status: AC

## 2018-10-22 MED FILL — DOXYCYCLINE HYCLATE 100 MG: 100 | 7 days supply | Qty: 14 | Fill #0

## 2018-10-22 NOTE — Patient Instructions (Addendum)
Keep area clean and dry.  Keep an eye on your blood pressure.   Things to look out for: increasing pain not relieved by ibuprofen/acetaminophen, fevers, spreading redness, drainage of pus, or foul odor.  Cancel appt if you are doing better.  Put some triple antibiotic ointment twice daily on finger for next week.   Let us know if you need anything.

## 2018-10-22 NOTE — Progress Notes (Signed)
Chief Complaint  Patient presents with  . Leg Injury    right leg    ERACLIO Camacho is a 58 y.o. male here for a skin complaint.  Duration: 3 days Location: R posterior thigh proximally Pruritic? No Painful? Yes Drainage? Yes New soaps/lotions/topicals/detergents? No Sick contacts? No Therapies tried thus far: none  Pt burned R index finger around 1 week ago. Has been using Neosporin 1x/d. No drainage or fevers.  Has issues losing wt. Reports diet is fair. Not currently exercising, though is active at work.   ROS:  Const: No fevers Skin: As noted in HPI  Past Medical History:  Diagnosis Date  . At risk for sleep apnea    STOP-BANG= 5    SENT TO PCP 03-17-2014  . Hard of hearing   . History of seizure    HX menigitis x2  in middle and high school - residual seizure's on medications--  last seizure age 25  . Hypertension   . Personal history of meningitis    X2  MIDDLE AND HIGH SCHOOL--  residual seizure's on medication -- last seizure age 41  . Prostate cancer ([place]) MONITORED BY DR OTTELIN   stage T1c, Gleason 3+4, PSA 12.79, vol 38.5cc    BP 140/82 (BP Location: Left Arm, Patient Position: Sitting, Cuff Size: Large)   Pulse (!) 128   Temp (!) 96.7 F (35.9 C) (Temporal)   Ht 6\' 2"  (1.88 m)   Wt (!) 323 lb 6 oz (146.7 kg)   SpO2 98%   BMI 41.52 kg/m  Gen: awake, alert, appearing stated age Lungs: No accessory muscle use Skin: under R bottock over R prox thigh, there is an elliptically shaped area of induration measuring approx 1.3 x 3 cm. It is ttp, mild amounts of drainage.  No excessive warmth, erythema, fluctuance, excoriation Linear vesicle over R middle phalanx on 2nd digit palmar side around 1 cm in length, no drainage or erythema.  Psych: Age appropriate judgment and insight  Furuncle - Plan: doxycycline (VIBRA-TABS) 100 MG tablet; f/u in 1 week if no better and will consider I&D  Morbid obesity ([place]) - Plan: Amb Ref to Medical Weight Management  Burn;  TAO bid . The patient voiced understanding and agreement to the plan.  [place], DO 10/22/18 3:28 PM

## 2018-10-28 ENCOUNTER — Other Ambulatory Visit: Payer: Self-pay

## 2018-10-28 ENCOUNTER — Ambulatory Visit: Payer: 59 | Admitting: Family Medicine

## 2018-10-29 ENCOUNTER — Encounter: Payer: Self-pay | Admitting: Family Medicine

## 2018-10-29 ENCOUNTER — Ambulatory Visit (INDEPENDENT_AMBULATORY_CARE_PROVIDER_SITE_OTHER): Payer: 59 | Admitting: Family Medicine

## 2018-10-29 VITALS — BP 128/88 | HR 100 | Temp 97.0°F | Ht 74.0 in | Wt 324.5 lb

## 2018-10-29 DIAGNOSIS — Z09 Encounter for follow-up examination after completed treatment for conditions other than malignant neoplasm: Secondary | ICD-10-CM

## 2018-10-29 DIAGNOSIS — J302 Other seasonal allergic rhinitis: Secondary | ICD-10-CM

## 2018-10-29 MED ORDER — LEVOCETIRIZINE DIHYDROCHLORIDE 5 MG PO TABS: 5.0000 mg | ORAL_TABLET | Freq: Every evening | ORAL | 2 refills | Status: DC

## 2018-10-29 MED FILL — LEVOCETIRIZINE 5 MG TABLET: 5 | 30 days supply | Qty: 30 | Fill #0

## 2018-10-29 MED FILL — VENTOLIN HFA 90 MCG INHALER: 108 (90 BAS | 25 days supply | Qty: 18 | Fill #1

## 2018-10-29 NOTE — Progress Notes (Signed)
Chief Complaint  Patient presents with  . Follow-up    Subjective: Patient is a 58 y.o. male here for f/u skin issue.  Started on doxy for a furuncle. Reports since that time he is much better. Skin is still hard. No drainage, fevers, or pain. Finished last dose of abx yesterday and was compliant.  Has some nasal congestion and intermittent sob. Has never been tx'd for allergies. No current cough or sob.    ROS: Const: No fevers  Past Medical History:  Diagnosis Date  . At risk for sleep apnea    STOP-BANG= 5    SENT TO PCP 03-17-2014  . Hard of hearing   . History of seizure    HX menigitis x2  in middle and high school - residual seizure's on medications--  last seizure age 34  . Hypertension   . Personal history of meningitis    X2  MIDDLE AND HIGH SCHOOL--  residual seizure's on medication -- last seizure age 59  . Prostate cancer ([place]) MONITORED BY DR OTTELIN   stage T1c, Gleason 3+4, PSA 12.79, vol 38.5cc    Objective: BP 128/88 (BP Location: Left Arm, Patient Position: Sitting, Cuff Size: Large)   Pulse 100   Temp (!) 97 F (36.1 C) (Temporal)   Ht 6\' 2"  (1.88 m)   Wt (!) 324 lb 8 oz (147.2 kg)   SpO2 93%   BMI 41.66 kg/m  General: Awake, appears stated age Skin: Indurated area on prox thigh/lower R glute, no drainage, erythema, excessive warmth or ttp Lungs: No accessory muscle use Psych: Age appropriate judgment and insight, normal affect and mood  Assessment and Plan: Follow-up for resolved condition  Seasonal allergies - Plan: levocetirizine (XYZAL) 5 MG tablet  1- Looks good 2- Trial antihist. If no improvement, would consider ICS.  F/u in 6 weeks to reck. Will give flu shot then as well.  The patient voiced understanding and agreement to the plan.  [place], DO 10/29/18  8:31 AM

## 2018-10-29 NOTE — Patient Instructions (Addendum)
This looks a lot better. If you continue to feel better, no changes. Let me know if it gets painful again or if it starts to drain.  The skin will take a few more weeks to get back to normal.  Use the inhaler as needed, the new pill is daily.   Let us know if you need anything.

## 2018-12-10 ENCOUNTER — Ambulatory Visit: Payer: 59 | Admitting: Family Medicine

## 2018-12-18 ENCOUNTER — Other Ambulatory Visit: Payer: Self-pay

## 2018-12-18 ENCOUNTER — Encounter: Payer: Self-pay | Admitting: Family Medicine

## 2018-12-18 ENCOUNTER — Ambulatory Visit: Payer: 59 | Admitting: Family Medicine

## 2018-12-18 VITALS — BP 136/96 | HR 96 | Temp 97.6°F | Resp 16 | Ht 74.0 in | Wt 327.4 lb

## 2018-12-18 DIAGNOSIS — Z1211 Encounter for screening for malignant neoplasm of colon: Secondary | ICD-10-CM

## 2018-12-18 DIAGNOSIS — J302 Other seasonal allergic rhinitis: Secondary | ICD-10-CM

## 2018-12-18 DIAGNOSIS — Z23 Encounter for immunization: Secondary | ICD-10-CM

## 2018-12-18 DIAGNOSIS — I10 Essential (primary) hypertension: Secondary | ICD-10-CM | POA: Diagnosis not present

## 2018-12-18 NOTE — Progress Notes (Signed)
Chief Complaint  Patient presents with  . Follow-up    Subjective: Patient is a 58 y.o. male here for f/u.   Started Xyzal for congestion and intermittent sob that is now better. Reports compliance, no AE's. Content w current s/s's.   Hypertension Patient presents for hypertension follow up. He does not monitor home blood pressures. He is compliant with medications-Prinzide 20-25 mg daily. Patient has these side effects of medication: none He is adhering to a healthy diet overall. Exercise: walking at work  ROS: Heart: Denies chest pain  Lungs: Denies SOB   Past Medical History:  Diagnosis Date  . At risk for sleep apnea    STOP-BANG= 5    SENT TO PCP 03-17-2014  . Hard of hearing   . History of seizure    HX menigitis x2  in middle and high school - residual seizure's on medications--  last seizure age 1  . Hypertension   . Personal history of meningitis    X2  MIDDLE AND HIGH SCHOOL--  residual seizure's on medication -- last seizure age 31  . Prostate cancer ([place]) MONITORED BY DR OTTELIN   stage T1c, Gleason 3+4, PSA 12.79, vol 38.5cc    Objective: BP (!) 136/96 (BP Location: Left Arm, Patient Position: Sitting, Cuff Size: Normal)   Pulse 96   Temp 97.6 F (36.4 C) (Temporal)   Resp 16   Ht 6\' 2"  (1.88 m)   Wt (!) 327 lb 6 oz (148.5 kg)   SpO2 96%   BMI 42.03 kg/m  General: Awake, appears stated age HEENT: MMM, EOMi Heart: RRR, no lower extremity edema Lungs: CTAB, no rales, wheezes or rhonchi. No accessory muscle use Psych: Age appropriate judgment and insight, normal affect and mood  Assessment and Plan: Seasonal allergies  Essential hypertension  Screen for colon cancer - Plan: Ambulatory referral to Gastroenterology  Need for immunization against influenza - Plan: Flu Vaccine QUAD 6+ mos PF IM (Fluarix Quad PF)  1-continue Xyzal 2-continue current medications.  Counseled on diet and exercise.  Recommended checking blood pressure at home.  He is  going to purchase a home blood pressure monitor.  He has been on amlodipine before, will have to verify why he stopped.  Could consider that versus metoprolol versus spironolactone. He does need a colonoscopy so I will refer him. Follow-up in 1 month to recheck blood pressure. The patient voiced understanding and agreement to the plan.  [place], DO 12/18/18  8:14 AM

## 2018-12-18 NOTE — Patient Instructions (Addendum)
Keep taking the Xyzal.  Keep the diet clean and stay active.  Gas-X and Bean-O can be helpful for gas if you have meat loaf or something like it in the future.   Check your blood pressures at home, check around 3 times weekly. Try to get an upper arm cuff, but a wrist one might be OK. Bring your monitor to your next appointment. [employer] is the brand we recommend.   Let us know if you need anything.

## 2018-12-18 NOTE — Progress Notes (Signed)
Pre visit review using our clinic review tool, if applicable. No additional management support is needed unless otherwise documented below in the visit note. 

## 2019-01-14 ENCOUNTER — Other Ambulatory Visit: Payer: Self-pay

## 2019-01-15 ENCOUNTER — Ambulatory Visit: Payer: 59 | Admitting: Family Medicine

## 2019-01-15 ENCOUNTER — Other Ambulatory Visit: Payer: Self-pay

## 2019-01-15 ENCOUNTER — Encounter: Payer: Self-pay | Admitting: Family Medicine

## 2019-01-15 VITALS — BP 128/86 | HR 102 | Temp 97.1°F | Ht 74.0 in | Wt 326.4 lb

## 2019-01-15 DIAGNOSIS — G5701 Lesion of sciatic nerve, right lower limb: Secondary | ICD-10-CM

## 2019-01-15 DIAGNOSIS — I10 Essential (primary) hypertension: Secondary | ICD-10-CM

## 2019-01-15 DIAGNOSIS — J302 Other seasonal allergic rhinitis: Secondary | ICD-10-CM

## 2019-01-15 NOTE — Patient Instructions (Addendum)
Keep the diet clean and stay active.   You might be able to stop the Xyzal during the winter time.   Wear supportive shoes. If you would like to see physical therapy for this issue, please let me know.   Let us know if you need anything.

## 2019-01-15 NOTE — Progress Notes (Signed)
Chief Complaint  Patient presents with  . Follow-up    Subjective Peter Camacho is a 58 y.o. male who presents for hypertension follow up. He does not monitor home blood pressures. He is compliant with medication- Prinzide 20-25 mg/d Patient has these side effects of medication: none He is adhering to a healthy diet overall. Current exercise: walking at work  Still doing well on the [place]. Tends to do well in the winter time.   He continues to have issues with his hip.  Reports compliance with stretches and exercises.  He has not had physical therapy.  His issue is mainly when he has to walk on the concrete.  Unfortunately he has to do this for his job.  He does have supportive shoes.   Past Medical History:  Diagnosis Date  . At risk for sleep apnea    STOP-BANG= 5    SENT TO PCP 03-17-2014  . Hard of hearing   . History of seizure    HX menigitis x2  in middle and high school - residual seizure's on medications--  last seizure age 28  . Hypertension   . Personal history of meningitis    X2  MIDDLE AND HIGH SCHOOL--  residual seizure's on medication -- last seizure age 61  . Prostate cancer ([place]) MONITORED BY DR OTTELIN   stage T1c, Gleason 3+4, PSA 12.79, vol 38.5cc   Review of Systems Cardiovascular: no chest pain Respiratory:  no shortness of breath  Exam BP 128/86 (BP Location: Right Arm, Patient Position: Sitting, Cuff Size: Large)   Pulse (!) 102   Temp (!) 97.1 F (36.2 C) (Temporal)   Ht 6\' 2"  (1.88 m)   Wt (!) 326 lb 6 oz (148 kg)   SpO2 94%   BMI 41.90 kg/m  General:  well developed, well nourished, in no apparent distress HEENT: nares patent, no rhinorrhea, MM Heart: RRR, no bruits, no LE edema Lungs: clear to auscultation, no accessory muscle use Psych: well oriented with normal range of affect and appropriate judgment/insight  Essential hypertension  Seasonal allergies  Piriformis syndrome of right side  Morbid obesity ([facility])  Blood pressure is  currently controlled.  Continue Prinzide.  Counseled on diet and exercise. Continue Xyzal, he might not need to take it during the wintertime.  He will definitely start during the springtime when pollen comes around. Wear supportive shoes, offered physical therapy.  He will let me know if you decide to go through with this. He does know he needs to lose weight.  This will likely help with both his blood pressure and musculoskeletal pains. I will see him in 3 months for med check.  If he is doing well, I will start to see him every 6 months afterwards. The patient voiced understanding and agreement to the plan.  [place], DO 01/15/19  8:27 AM

## 2019-01-22 ENCOUNTER — Encounter: Payer: Self-pay | Admitting: Family Medicine

## 2019-02-13 ENCOUNTER — Other Ambulatory Visit: Payer: Self-pay

## 2019-02-14 ENCOUNTER — Ambulatory Visit: Payer: 59 | Admitting: Family Medicine

## 2019-02-14 ENCOUNTER — Encounter: Payer: Self-pay | Admitting: Family Medicine

## 2019-02-14 VITALS — BP 130/80 | HR 100 | Temp 96.8°F | Ht 74.5 in | Wt 323.5 lb

## 2019-02-14 DIAGNOSIS — G8929 Other chronic pain: Secondary | ICD-10-CM

## 2019-02-14 DIAGNOSIS — S31819A Unspecified open wound of right buttock, initial encounter: Secondary | ICD-10-CM | POA: Diagnosis not present

## 2019-02-14 DIAGNOSIS — M545 Low back pain, unspecified: Secondary | ICD-10-CM

## 2019-02-14 DIAGNOSIS — J302 Other seasonal allergic rhinitis: Secondary | ICD-10-CM

## 2019-02-14 MED ORDER — SILVER SULFADIAZINE 1 % EX CREA: 1.0000 "application " | TOPICAL_CREAM | Freq: Every day | CUTANEOUS | 0 refills | Status: DC

## 2019-02-14 MED ORDER — ALBUTEROL SULFATE HFA 108 (90 BASE) MCG/ACT IN AERS: INHALATION_SPRAY | RESPIRATORY_TRACT | 2 refills | Status: DC

## 2019-02-14 MED FILL — VENTOLIN HFA 90 MCG INHALER: 108 (90 BAS | 25 days supply | Qty: 18 | Fill #0

## 2019-02-14 MED FILL — SSD 1% CREAM: 1 | 14 days supply | Qty: 50 | Fill #0

## 2019-02-14 NOTE — Progress Notes (Signed)
CC: Skin complaint  Peter Camacho is a 58 y.o. male here for a skin complaint.  Duration: 2 days Location: R buttock Pruritic? No Painful? Yes Drainage? Yes New soaps/lotions/topicals/detergents? No Sick contacts? No Therapies tried thus far: washed it and it popped out  Continued chronic low back pain. Worse when he walks at work on concrete. Tried supportive shoes. Stretches/exercises were given and wt loss rec'd. No neurologic s/s's.   ROS:  Const: No fevers Skin: As noted in HPI  Past Medical History:  Diagnosis Date  . At risk for sleep apnea    STOP-BANG= 5    SENT TO PCP 03-17-2014  . Hard of hearing   . History of seizure    HX menigitis x2  in middle and high school - residual seizure's on medications--  last seizure age 69  . Hypertension   . Personal history of meningitis    X2  MIDDLE AND HIGH SCHOOL--  residual seizure's on medication -- last seizure age 89  . Prostate cancer ([place]) MONITORED BY DR OTTELIN   stage T1c, Gleason 3+4, PSA 12.79, vol 38.5cc    BP 130/80 (BP Location: Left Arm, Patient Position: Sitting, Cuff Size: Large)   Pulse 100   Temp (!) 96.8 F (36 C) (Temporal)   Ht 6' 2.5" (1.892 m)   Wt (!) 323 lb 8 oz (146.7 kg)   SpO2 95%   BMI 40.98 kg/m  Gen: awake, alert, appearing stated age Lungs: No accessory muscle use Skin: circular area of induration on R buttock near cleft w central area of excoriation with some purulence, measuring approx 3 x 3.5 cm. Skin is scaling around area. No drainage, erythema, TTP, fluctuance. Psych: Age appropriate judgment and insight  Wound of right buttock, initial encounter - Plan: silver sulfADIAZINE (SILVADENE) 1 % cream  Seasonal allergies - Plan: albuterol (VENTOLIN HFA) 108 (90 Base) MCG/ACT inhaler  Chronic midline low back pain, unspecified whether sciatica present - Plan: Ambulatory referral to Physical Therapy  1- Looks like a healing wound at this pt. Silvadene, Vaseline. 2- Refill SABA. 3-  Refer PT. Counseled on wt loss. He has lost some wt which I commended him for.  F/u as originally scheduled. The patient voiced understanding and agreement to the plan.  Jasper, DO 02/14/19 9:30 AM

## 2019-02-14 NOTE — Patient Instructions (Addendum)
Continue to lose weight. Strong work with this. The more weight you lose, the better your low back will be.   Use the cream daily over the open area. Let me know if this is not better in the next 2-3 weeks.   Use Vaseline or some sort of lotion on the skin around your wound.  If you do not hear anything about your referral in the next 1-2 weeks, call our office and ask for an update.  Let us know if you need anything.

## 2019-03-04 ENCOUNTER — Ambulatory Visit: Payer: 59 | Admitting: Physical Therapy

## 2019-03-10 ENCOUNTER — Other Ambulatory Visit: Payer: Self-pay

## 2019-03-10 ENCOUNTER — Ambulatory Visit: Payer: 59 | Attending: Family Medicine | Admitting: Physical Therapy

## 2019-03-10 ENCOUNTER — Encounter: Payer: Self-pay | Admitting: Physical Therapy

## 2019-03-10 DIAGNOSIS — R262 Difficulty in walking, not elsewhere classified: Secondary | ICD-10-CM | POA: Diagnosis present

## 2019-03-10 DIAGNOSIS — R29898 Other symptoms and signs involving the musculoskeletal system: Secondary | ICD-10-CM | POA: Insufficient documentation

## 2019-03-10 DIAGNOSIS — G8929 Other chronic pain: Secondary | ICD-10-CM | POA: Insufficient documentation

## 2019-03-10 DIAGNOSIS — M5441 Lumbago with sciatica, right side: Secondary | ICD-10-CM | POA: Insufficient documentation

## 2019-03-10 NOTE — Therapy (Signed)
[facility] [address]  [place], [name], [address] Phone: [phone]   Fax:  [phone]  Physical Therapy Evaluation  Patient Details  Name: Peter Camacho MRN: [id number] Date of Birth: Apr 23, 1960 Referring Provider (PT): [name] [place], [place]   Encounter Date: 03/10/2019  PT End of Session - 03/10/19 1811    Visit Number  1    Number of Visits  5    Date for PT Re-Evaluation  05/05/19   date offset d/t 1st tx scheduled in [place]    PT Start Time  1707    PT Stop Time  1801    PT Time Calculation (min)  54 min    Activity Tolerance  Patient tolerated treatment well    Behavior During Therapy  [facility] for tasks assessed/performed       Past Medical History:  Diagnosis Date  . At risk for sleep apnea    STOP-BANG= 5    SENT TO PCP 03-17-2014  . Hard of hearing   . History of seizure    HX menigitis x2  in middle and high school - residual seizure's on medications--  last seizure age 65  . Hypertension   . Personal history of meningitis    X2  MIDDLE AND HIGH SCHOOL--  residual seizure's on medication -- last seizure age 45  . Prostate cancer ([place]) MONITORED BY DR OTTELIN   stage T1c, Gleason 3+4, PSA 12.79, vol 38.5cc    Past Surgical History:  Procedure Laterality Date  . PROSTATE BIOPSY    . RADIOACTIVE SEED IMPLANT N/A 03/20/2014   Procedure: RADIOACTIVE SEED IMPLANT;  Surgeon: Claybon Jabs, MD;  Location: [facility];  Service: Urology;  Laterality: N/A;    There were no vitals filed for this visit.   Subjective Assessment - 03/10/19 1709    Subjective  Patient reports R LBP with radiating pain down buttock and into the anterior and posterior thigh, rarely into calf, for the past year. Patient works on concrete which he believes makes this pain worse. Had been seeing a chiropractor without relief. Pain worse with walking and when standing up from  sitting. Denies N/T. Mildly better with meds. Walks 18 miles/week and works one full time and one part time job.    Pertinent History  prostate CA, HTN, hx of seizure, HOH    Limitations  Standing;Walking    How long can you sit comfortably?  unlimited    How long can you stand comfortably?  unable to estimate    How long can you walk comfortably?  unable to estimate    Diagnostic tests  R hip xray: negative    Patient Stated Goals  get rid of pain    Currently in Pain?  No/denies    Pain Score  0-No pain    Pain Location  Back    Pain Orientation  Right    Pain Descriptors / Indicators  Tiring    Pain Type  Chronic pain         [facility] PT Assessment - 03/10/19 1718      Assessment   Medical Diagnosis  Chronic midline LBP, uspecified whether sciatica present    Referring Provider (PT)  [name], DO    Onset Date/Surgical Date  03/09/18    Next MD Visit  04/02/19    Prior Therapy  yes- for knee  Precautions   Precautions  --   hx prostate CA, HOH- best in L ear     Balance Screen   Has the patient fallen in the past 6 months  No    Has the patient had a decrease in activity level because of a fear of falling?   No    Is the patient reluctant to leave their home because of a fear of falling?   No      Home Environment   Living Environment  Private residence    Living Arrangements  Alone    Available Help at Discharge  --    Type of [place]    Additional Comments  not willing to provide any more social background information      Prior Function   Level of Independence  Independent    Vocation  Full time employment;Part time employment    Vocation Requirements  full time- lifting, standing on concrete, part time- walking    Leisure  n/a      Cognition   Overall Cognitive Status  Within Functional Limits for tasks assessed      Sensation   Light Touch  Appears Intact      Coordination   Gross Motor Movements are Fluid and Coordinated  Yes       Posture/Postural Control   Posture/Postural Control  Postural limitations    Postural Limitations  Rounded Shoulders;Posterior pelvic tilt      ROM / Strength   AROM / PROM / Strength  AROM;Strength      AROM   AROM Assessment Site  Lumbar;Hip    Right/Left Hip  --   B hip AROM grossly assessed- B limited and L "tight" hip IR   Lumbar Flexion  mid shin    Lumbar Extension  mildly limited    Lumbar - Right Side Bend  jt line    Lumbar - Left Side Bend  jt line    Lumbar - Right Rotation  mildly limited    Lumbar - Left Rotation  mildly limited   mild R LBP     Strength   Strength Assessment Site  Hip;Knee;Ankle    Right/Left Hip  Right;Left    Right Hip Flexion  4/5    Right Hip ABduction  4+/5    Right Hip ADduction  4/5    Left Hip Flexion  4+/5    Left Hip ABduction  4+/5    Left Hip ADduction  4/5    Right/Left Knee  Right;Left    Right Knee Flexion  4/5    Right Knee Extension  4/5    Left Knee Flexion  4+/5    Left Knee Extension  4+/5    Right/Left Ankle  Right;Left    Right Ankle Dorsiflexion  4/5    Right Ankle Plantar Flexion  4+/5    Left Ankle Dorsiflexion  4/5    Left Ankle Plantar Flexion  4+/5      Flexibility   Soft Tissue Assessment /Muscle Length  yes    Hamstrings  B WNL    Quadriceps  mod thomas test- mildly tight on L, moderately tight on R    Piriformis  mildly tight B      Palpation   Spinal mobility  no TTP along spine    Palpation comment  high tone throughout posterior chain, especially in R proximal glutes; no TTP      Ambulation/Gait   Assistive device  None  Gait Pattern  Step-through pattern;Lateral trunk lean to right;Lateral trunk lean to left;Decreased stance time - right;Decreased weight shift to right    Ambulation Surface  Level;Indoor    Gait velocity  decreased                Objective measurements completed on examination: See above findings.              PT Education - 03/10/19 1803    Education  Details  prognosis, POC, HEP    Person(s) Educated  Patient    Methods  Explanation;Demonstration;Tactile cues;Handout;Verbal cues    Comprehension  Verbalized understanding;Returned demonstration       PT Short Term Goals - 03/10/19 1824      PT SHORT TERM GOAL #1   Title  Patient to be independent with initial HEP.    Time  4    Period  Weeks    Status  New    Target Date  04/07/19        PT Long Term Goals - 03/10/19 1826      PT LONG TERM GOAL #1   Title  Patient to be independent with advanced HEP.    Time  4    Period  Weeks    Status  New    Target Date  05/05/19      PT LONG TERM GOAL #2   Title  Patient to demonstrate no tightness in B piriformis and hip flexors.    Time  4    Period  Weeks    Status  New    Target Date  05/05/19      PT LONG TERM GOAL #3   Title  Patient to demonstrate B LE strength >/=4+/5.    Time  8    Period  Weeks    Status  New    Target Date  05/05/19      PT LONG TERM GOAL #4   Title  Patient to report 80% improvement in pain levels with STS transfers.    Time  8    Period  Weeks    Status  New    Target Date  05/05/19             Plan - 03/10/19 1812    Clinical Impression Statement  Patient is a 59y/o M presenting to OPPT with c/o R LBP for 1 year duration. Pain radiates from LB down to buttock and anterior and posterior thigh; rarely into calf. Denies N/T. Worse with walking, and patient reporting this is especially worse when walking or standing on concrete at his job. Patient works 2 jobs and walks [employer]. Today's assessment considerably limited by patient's [facility] despite wearing hearing aid. Patient today presenting with mildly limited but nonpainful lumbar AROM, decreased LE strength, tightness in B hip flexors and piriformis, increased tone in R proximal glutes, and gait deviations. Patient's pain very minimally reproduced with L lumbar rotation today. Patient educated and received handout on gentle stretching  HEP- patient reported understanding. Would benefit from skilled PT services 1x/week for 4 weeks to address aforementioned impairments.    Personal Factors and Comorbidities  Age;Sex;Comorbidity 3+;Time since onset of injury/illness/exacerbation;Fitness;Past/Current Experience;Profession    Comorbidities  prostate CA, HTN, hx of seizure, HOH    Examination-Activity Limitations  Stairs;Carry;Stand;Locomotion Level;Transfers    Examination-Participation Restrictions  Church;Cleaning;Shop;Community Activity;Yard Work;Laundry;Meal Prep    Stability/Clinical Decision Making  Unstable/Unpredictable    Clinical Decision Making  High    Rehab Potential  Fair    PT Frequency  1x / week    PT Duration  4 weeks    PT Treatment/Interventions  ADLs/Self Care Home Management;Cryotherapy;Iontophoresis 4mg /ml Dexamethasone;Electrical Stimulation;Moist Heat;Therapeutic exercise;Therapeutic activities;Functional mobility training;Stair training;Gait training;Ultrasound;Neuromuscular re-education;Patient/family education;Manual techniques;Taping;Dry needling;Passive range of motion    PT Next Visit Plan  reassess HEP; FOTO    Consulted and Agree with Plan of Care  Patient       Patient will benefit from skilled therapeutic intervention in order to improve the following deficits and impairments:  Decreased activity tolerance, Decreased strength, Pain, Difficulty walking, Increased muscle spasms, Improper body mechanics, Decreased range of motion, Impaired flexibility, Postural dysfunction  Visit Diagnosis: Chronic right-sided low back pain with right-sided sciatica  Difficulty in walking, not elsewhere classified  Impaired flexibility of lower extremity     Problem List Patient Active Problem List   Diagnosis Date Noted  . Piriformis syndrome of right side 01/15/2019  . Sciatica of right side 09/30/2018  . Morbid obesity ([place]) 12/24/2017  . Plantar fasciitis 08/09/2017  . Seasonal allergies 08/09/2017   . Lobar pneumonia ([place]) 11/30/2016  . Sepsis due to pneumonia ([place]) 11/29/2016  . Essential hypertension 11/29/2016  . Tobacco dependence 11/29/2016  . Prostate Cancer with a Gleason's Score of 3+4 and a PSA of 12.79 01/11/2014    Janene Harvey, PT, DPT 03/10/19 6:34 PM   [facility] [address]  [address] [place], [name], [address] Phone: [phone]   Fax:  [phone]  Name: Peter Camacho MRN: [id number] Date of Birth: Jul 24, 1960

## 2019-03-21 ENCOUNTER — Other Ambulatory Visit: Payer: Self-pay | Admitting: Family Medicine

## 2019-03-21 DIAGNOSIS — J302 Other seasonal allergic rhinitis: Secondary | ICD-10-CM

## 2019-03-21 DIAGNOSIS — I10 Essential (primary) hypertension: Secondary | ICD-10-CM

## 2019-03-31 ENCOUNTER — Ambulatory Visit: Payer: 59 | Admitting: Physical Therapy

## 2019-04-02 ENCOUNTER — Ambulatory Visit: Payer: 59 | Admitting: Family Medicine

## 2019-04-07 ENCOUNTER — Other Ambulatory Visit: Payer: Self-pay

## 2019-04-07 ENCOUNTER — Ambulatory Visit: Payer: 59 | Attending: Family Medicine

## 2019-04-07 DIAGNOSIS — G8929 Other chronic pain: Secondary | ICD-10-CM | POA: Diagnosis present

## 2019-04-07 DIAGNOSIS — R262 Difficulty in walking, not elsewhere classified: Secondary | ICD-10-CM | POA: Insufficient documentation

## 2019-04-07 DIAGNOSIS — M5441 Lumbago with sciatica, right side: Secondary | ICD-10-CM | POA: Diagnosis present

## 2019-04-07 DIAGNOSIS — R29898 Other symptoms and signs involving the musculoskeletal system: Secondary | ICD-10-CM

## 2019-04-07 NOTE — Therapy (Signed)
[facility] [address]  [place], [name], [address] Phone: [phone]   Fax:  [phone]  Physical Therapy Treatment  Patient Details  Name: Peter Camacho MRN: [id number] Date of Birth: 1960/08/07 Referring Provider (PT): [name] [place], [place]   Encounter Date: 04/07/2019  PT End of Session - 04/07/19 1719    Visit Number  2    Number of Visits  5    Date for PT Re-Evaluation  05/05/19   date offset d/t 1st tx scheduled in [place]    PT Start Time  1713    PT Stop Time  1751    PT Time Calculation (min)  38 min    Activity Tolerance  Patient tolerated treatment well    Behavior During Therapy  [facility] for tasks assessed/performed       Past Medical History:  Diagnosis Date  . At risk for sleep apnea    STOP-BANG= 5    SENT TO PCP 03-17-2014  . Hard of hearing   . History of seizure    HX menigitis x2  in middle and high school - residual seizure's on medications--  last seizure age 25  . Hypertension   . Personal history of meningitis    X2  MIDDLE AND HIGH SCHOOL--  residual seizure's on medication -- last seizure age 13  . Prostate cancer ([place]) MONITORED BY DR OTTELIN   stage T1c, Gleason 3+4, PSA 12.79, vol 38.5cc    Past Surgical History:  Procedure Laterality Date  . PROSTATE BIOPSY    . RADIOACTIVE SEED IMPLANT N/A 03/20/2014   Procedure: RADIOACTIVE SEED IMPLANT;  Surgeon: Claybon Jabs, MD;  Location: [facility];  Service: Urology;  Laterality: N/A;    There were no vitals filed for this visit.  Subjective Assessment - 04/07/19 1717    Subjective  Pt. reporting he feels standing and walking on concrete floors contributes to his pain.    Pertinent History  prostate CA, HTN, hx of seizure, HOH    Patient Stated Goals  get rid of pain    Currently in Pain?  No/denies    Pain Score  0-No pain   up to a 10/10 at times   Pain Location  Back    Pain Orientation  Right    Pain Descriptors / Indicators  Tiring    Pain Type  Chronic pain    Multiple Pain Sites  No                       [facility] Adult PT Treatment/Exercise - 04/07/19 0001      Self-Care   Self-Care  Other Self-Care Comments    Other Self-Care Comments   Educated patient on benefit of performing HEP daily and encouraged him to continue performing HEP as directed on handout as pt. had lost previous paper handout       Lumbar Exercises: Stretches   Passive Hamstring Stretch  Right;Left;1 rep;30 seconds    Passive Hamstring Stretch Limitations  supine with strap     Lower Trunk Rotation Limitations  5" x 10 resp     Hip Flexor Stretch  Right;Left;1 rep;30 seconds    Hip Flexor Stretch Limitations  mod thomas strap     Quadruped Mid Back Stretch  2 reps;20 seconds    Quadruped Mid Back Stretch Limitations  3-way red p-ball rollouts seated  Piriformis Stretch  Right;Left;1 rep;30 seconds    Piriformis Stretch Limitations  KTOS with towel     Figure 4 Stretch  1 rep;30 seconds    Figure 4 Stretch Limitations  B Figure-4     Other Lumbar Stretch Exercise  B sciatic nerve glide x 10 reps each LE      Knee/Hip Exercises: Aerobic   Nustep  Lvl 4, 7 min (LE/UE)              PT Education - 04/07/19 1801    Education Details  HEP update;  LTR,  supine HS stretch with strap    Person(s) Educated  Patient    Methods  Explanation;Demonstration;Verbal cues;Handout    Comprehension  Verbalized understanding;Returned demonstration;Verbal cues required       PT Short Term Goals - 04/07/19 1719      PT SHORT TERM GOAL #1   Title  Patient to be independent with initial HEP.    Time  4    Period  Weeks    Status  On-going    Target Date  04/07/19        PT Long Term Goals - 04/07/19 1719      PT LONG TERM GOAL #1   Title  Patient to be independent with advanced HEP.    Time  4    Period  Weeks    Status  On-going      PT LONG TERM GOAL  #2   Title  Patient to demonstrate no tightness in B piriformis and hip flexors.    Time  4    Period  Weeks    Status  On-going      PT LONG TERM GOAL #3   Title  Patient to demonstrate B LE strength >/=4+/5.    Time  8    Period  Weeks    Status  On-going      PT LONG TERM GOAL #4   Title  Patient to report 80% improvement in pain levels with STS transfers.    Time  8    Period  Weeks    Status  On-going            Plan - 04/07/19 1720    Clinical Impression Statement  Pt. doing well today however with complaint of LBP/R buttocks pain which he feels worsens when he stands on concrete floor at work.  Session focused on LE stretching and proximal hip stretching as pt. noting most benefit from this and complete resolution of his pain.  Updated HEP with additional LE stretching and re-issued original HEP handout that pt. had lost.  Encouraged pt. to continue performing full HEP daily for full benefit from therapy with pt. verbalizing understanding.    Comorbidities  prostate CA, HTN, hx of seizure, HOH    Rehab Potential  Fair    PT Treatment/Interventions  ADLs/Self Care Home Management;Cryotherapy;Iontophoresis 4mg /ml Dexamethasone;Electrical Stimulation;Moist Heat;Therapeutic exercise;Therapeutic activities;Functional mobility training;Stair training;Gait training;Ultrasound;Neuromuscular re-education;Patient/family education;Manual techniques;Taping;Dry needling;Passive range of motion    PT Next Visit Plan  progress LE stretching and monitor adherence to HEP.    Consulted and Agree with Plan of Care  Patient       Patient will benefit from skilled therapeutic intervention in order to improve the following deficits and impairments:  Decreased activity tolerance, Decreased strength, Pain, Difficulty walking, Increased muscle spasms, Improper body mechanics, Decreased range of motion, Impaired flexibility, Postural dysfunction  Visit Diagnosis: Chronic right-sided low back pain  with right-sided  sciatica  Difficulty in walking, not elsewhere classified  Impaired flexibility of lower extremity     Problem List Patient Active Problem List   Diagnosis Date Noted  . Piriformis syndrome of right side 01/15/2019  . Sciatica of right side 09/30/2018  . Morbid obesity ([place]) 12/24/2017  . Plantar fasciitis 08/09/2017  . Seasonal allergies 08/09/2017  . Lobar pneumonia ([place]) 11/30/2016  . Sepsis due to pneumonia ([place]) 11/29/2016  . Essential hypertension 11/29/2016  . Tobacco dependence 11/29/2016  . Prostate Cancer with a Gleason's Score of 3+4 and a PSA of 12.79 01/11/2014    Bess Harvest, PTA 04/07/19 6:04 PM   [facility] [address]  [address] [place], [name], [address] Phone: [phone]   Fax:  [phone]  Name: Peter Camacho MRN: [id number] Date of Birth: June 03, 1960

## 2019-04-14 ENCOUNTER — Ambulatory Visit: Payer: 59

## 2019-04-16 ENCOUNTER — Ambulatory Visit: Payer: 59

## 2019-04-16 ENCOUNTER — Ambulatory Visit: Payer: 59 | Admitting: Family Medicine

## 2019-04-16 ENCOUNTER — Other Ambulatory Visit: Payer: Self-pay

## 2019-04-16 DIAGNOSIS — R29898 Other symptoms and signs involving the musculoskeletal system: Secondary | ICD-10-CM

## 2019-04-16 DIAGNOSIS — G8929 Other chronic pain: Secondary | ICD-10-CM

## 2019-04-16 DIAGNOSIS — R262 Difficulty in walking, not elsewhere classified: Secondary | ICD-10-CM

## 2019-04-16 DIAGNOSIS — M5441 Lumbago with sciatica, right side: Secondary | ICD-10-CM | POA: Diagnosis not present

## 2019-04-16 NOTE — Therapy (Signed)
Peter Camacho [address]  Peter Camacho, [name], [address] Phone: [phone]   Fax:  [phone]  Physical Therapy Treatment  Patient Details  Name: Peter Camacho MRN: [id number] Date of Birth: 1960/07/13 Referring Provider (PT): [name] [place], [place]   Encounter Date: 04/16/2019  PT End of Session - 04/16/19 1749    Visit Number  3    Number of Visits  5    Date for PT Re-Evaluation  05/05/19   date offset d/t 1st tx scheduled in [place]    PT Start Time  1732    PT Stop Time  1755    PT Time Calculation (min)  23 min    Activity Tolerance  Patient tolerated treatment well    Behavior During Therapy  [facility] for tasks assessed/performed       Past Medical History:  Diagnosis Date  . At risk for sleep apnea    STOP-BANG= 5    SENT TO PCP 03-17-2014  . Hard of hearing   . History of seizure    HX menigitis x2  in middle and high school - residual seizure's on medications--  last seizure age 24  . Hypertension   . Personal history of meningitis    X2  MIDDLE AND HIGH SCHOOL--  residual seizure's on medication -- last seizure age 65  . Prostate cancer ([place]) MONITORED BY DR OTTELIN   stage T1c, Gleason 3+4, PSA 12.79, vol 38.5cc    Past Surgical History:  Procedure Laterality Date  . PROSTATE BIOPSY    . RADIOACTIVE SEED IMPLANT N/A 03/20/2014   Procedure: RADIOACTIVE SEED IMPLANT;  Surgeon: Claybon Jabs, MD;  Location: [facility];  Service: Urology;  Laterality: N/A;    There were no vitals filed for this visit.  Subjective Assessment - 04/16/19 1755    Subjective  Pt. reporting increased HEP adherence.    Pertinent History  prostate CA, HTN, hx of seizure, HOH    Diagnostic tests  R hip xray: negative    Patient Stated Goals  get rid of pain    Currently in Pain?  No/denies    Pain Score  0-No pain   5/10 pain at most   Pain Location  Back    Pain  Orientation  Right    Pain Descriptors / Indicators  Tiring    Pain Type  Chronic pain    Multiple Pain Sites  No                       [facility] Adult PT Treatment/Exercise - 04/16/19 0001      Lumbar Exercises: Stretches   Passive Hamstring Stretch  Right;Left;1 rep;30 seconds    Passive Hamstring Stretch Limitations  Manual with therapist     Lower Trunk Rotation Limitations  5" x 10 resp     Piriformis Stretch  Right;Left;1 rep;30 seconds    Piriformis Stretch Limitations  Manual with therpaist     Figure 4 Stretch  1 rep;30 seconds    Figure 4 Stretch Limitations  Manual with therapist     Gastroc Stretch  Right;Left;1 rep;30 seconds    Gastroc Stretch Limitations  Manual with therpaist       Knee/Hip Exercises: Aerobic   Nustep  Lvl 4, 4 min (LE/UE)    limited warmup due to pt. arriving late to session  PT Short Term Goals - 04/16/19 1749      PT SHORT TERM GOAL #1   Title  Patient to be independent with initial HEP.    Time  4    Period  Weeks    Status  Partially Met   admitting to partial adherence   Target Date  04/07/19        PT Long Term Goals - 04/07/19 1719      PT LONG TERM GOAL #1   Title  Patient to be independent with advanced HEP.    Time  4    Period  Weeks    Status  On-going      PT LONG TERM GOAL #2   Title  Patient to demonstrate no tightness in B piriformis and hip flexors.    Time  4    Period  Weeks    Status  On-going      PT LONG TERM GOAL #3   Title  Patient to demonstrate B LE strength >/=4+/5.    Time  8    Period  Weeks    Status  On-going      PT LONG TERM GOAL #4   Title  Patient to report 80% improvement in pain levels with STS transfers.    Time  8    Period  Weeks    Status  On-going            Plan - 04/16/19 1749    Clinical Impression Statement  Pt. admitting to improved HEP adherence however still partial at this Camacho.  Denies issues with updated HEP.  Pt. arrived  significantly late to today's sesison thus treatment time limtied.  Tolerated all therapist assitred LE stretching and gentle lumbopelvic ROM activities well.    Comorbidities  prostate CA, HTN, hx of seizure, HOH    Rehab Potential  Fair    PT Treatment/Interventions  ADLs/Self Care Home Management;Cryotherapy;Iontophoresis 30m/ml Dexamethasone;Electrical Stimulation;Moist Heat;Therapeutic exercise;Therapeutic activities;Functional mobility training;Stair training;Gait training;Ultrasound;Neuromuscular re-education;Patient/family education;Manual techniques;Taping;Dry needling;Passive range of motion    PT Next Visit Plan  progress LE stretching and monitor adherence to HEP.    Consulted and Agree with Plan of Care  Patient       Patient will benefit from skilled therapeutic intervention in order to improve the following deficits and impairments:  Decreased activity tolerance, Decreased strength, Pain, Difficulty walking, Increased muscle spasms, Improper body mechanics, Decreased range of motion, Impaired flexibility, Postural dysfunction  Visit Diagnosis: Chronic right-sided low back pain with right-sided sciatica  Difficulty in walking, not elsewhere classified  Impaired flexibility of lower extremity     Problem List Patient Active Problem List   Diagnosis Date Noted  . Piriformis syndrome of right side 01/15/2019  . Sciatica of right side 09/30/2018  . Morbid obesity ([place] 12/24/2017  . Plantar fasciitis 08/09/2017  . Seasonal allergies 08/09/2017  . Lobar pneumonia ([place] 11/30/2016  . Sepsis due to pneumonia ([place] 11/29/2016  . Essential hypertension 11/29/2016  . Tobacco dependence 11/29/2016  . Prostate Cancer with a Gleason's Score of 3+4 and a PSA of 12.79 01/11/2014    MBess Harvest PTA 04/16/19 6:07 PM   [facility] [address] [address]Phone: [phone]  Fax:   [phone] Name: Peter XIAOMRN: [id number] of Birth: 807-Sep-1962

## 2019-04-21 ENCOUNTER — Ambulatory Visit: Payer: 59 | Admitting: Physical Therapy

## 2019-04-23 ENCOUNTER — Ambulatory Visit: Payer: 59

## 2019-04-30 ENCOUNTER — Ambulatory Visit: Payer: 59

## 2019-05-07 ENCOUNTER — Encounter: Payer: Self-pay | Admitting: Family Medicine

## 2019-05-07 ENCOUNTER — Other Ambulatory Visit: Payer: Self-pay

## 2019-05-07 ENCOUNTER — Ambulatory Visit: Payer: 59 | Attending: Family Medicine

## 2019-05-07 ENCOUNTER — Ambulatory Visit: Payer: 59 | Admitting: Family Medicine

## 2019-05-07 VITALS — BP 128/86 | HR 102 | Temp 96.5°F | Ht 74.5 in | Wt 326.2 lb

## 2019-05-07 DIAGNOSIS — S31819A Unspecified open wound of right buttock, initial encounter: Secondary | ICD-10-CM

## 2019-05-07 DIAGNOSIS — R262 Difficulty in walking, not elsewhere classified: Secondary | ICD-10-CM

## 2019-05-07 DIAGNOSIS — M5441 Lumbago with sciatica, right side: Secondary | ICD-10-CM | POA: Diagnosis present

## 2019-05-07 DIAGNOSIS — R29898 Other symptoms and signs involving the musculoskeletal system: Secondary | ICD-10-CM | POA: Insufficient documentation

## 2019-05-07 DIAGNOSIS — I10 Essential (primary) hypertension: Secondary | ICD-10-CM | POA: Diagnosis not present

## 2019-05-07 DIAGNOSIS — G8929 Other chronic pain: Secondary | ICD-10-CM

## 2019-05-07 MED ORDER — SILVER SULFADIAZINE 1 % EX CREA: 1.0000 "application " | TOPICAL_CREAM | Freq: Every day | CUTANEOUS | 0 refills | Status: DC

## 2019-05-07 MED FILL — SSD 1% CREAM: 1 | 30 days supply | Qty: 50 | Fill #0

## 2019-05-07 NOTE — Therapy (Addendum)
[facility] [address]  [place], [name], [address] Phone: [phone]   Fax:  [phone]  Physical Therapy Treatment  Patient Details  Name: Peter Camacho MRN: [id number] Date of Birth: 05-11-1960 Referring Provider (PT): [name] [place], [place]   Encounter Date: 05/07/2019  PT End of Session - 05/07/19 1716    Visit Number  4    Number of Visits  5    Date for PT Re-Evaluation  05/05/19   date offset d/t 1st tx scheduled in [place]    PT Start Time  1710    PT Stop Time  1752    PT Time Calculation (min)  42 min    Activity Tolerance  Patient tolerated treatment well    Behavior During Therapy  [facility] for tasks assessed/performed       Past Medical History:  Diagnosis Date  . At risk for sleep apnea    STOP-BANG= 5    SENT TO PCP 03-17-2014  . Hard of hearing   . History of seizure    HX menigitis x2  in middle and high school - residual seizure's on medications--  last seizure age 58  . Hypertension   . Personal history of meningitis    X2  MIDDLE AND HIGH SCHOOL--  residual seizure's on medication -- last seizure age 80  . Prostate cancer ([place]) MONITORED BY DR OTTELIN   stage T1c, Gleason 3+4, PSA 12.79, vol 38.5cc    Past Surgical History:  Procedure Laterality Date  . PROSTATE BIOPSY    . RADIOACTIVE SEED IMPLANT N/A 03/20/2014   Procedure: RADIOACTIVE SEED IMPLANT;  Surgeon: Claybon Jabs, MD;  Location: [facility];  Service: Urology;  Laterality: N/A;    There were no vitals filed for this visit.  Subjective Assessment - 05/07/19 1715    Subjective  Pt. reporting benefit from LE stretching.  Notes, "I can feel it in my hips today after being on the concrete floors".    Pertinent History  prostate CA, HTN, hx of seizure, HOH    Diagnostic tests  R hip xray: negative    Patient Stated Goals  get rid of pain    Currently in Pain?  Yes    Pain  Score  5     Pain Location  Back    Pain Orientation  Right;Left    Multiple Pain Sites  No         [facility] PT Assessment - 05/07/19 0001      Strength   Strength Assessment Site  Hip;Knee;Ankle    Right/Left Hip  Right;Left    Right Hip Flexion  4+/5    Right Hip ABduction  4+/5    Right Hip ADduction  4+/5    Left Hip Flexion  4+/5    Left Hip ABduction  4+/5    Left Hip ADduction  4+/5    Right/Left Knee  Right;Left    Right Knee Flexion  4+/5    Right Knee Extension  5/5    Left Knee Flexion  4+/5    Left Knee Extension  5/5    Right/Left Ankle  Right;Left    Right Ankle Dorsiflexion  5/5    Right Ankle Plantar Flexion  4+/5    Left Ankle Dorsiflexion  5/5    Left Ankle Plantar Flexion  4+/5  [facility] Adult PT Treatment/Exercise - 05/07/19 0001      Self-Care   Self-Care  Other Self-Care Comments    Other Self-Care Comments   discussed LE strengthening on machines for use at gym as pt. planning on attending gym      Lumbar Exercises: Stretches   Passive Hamstring Stretch  Right;Left;1 rep;30 seconds    Passive Hamstring Stretch Limitations  Manual with therapist     Single Knee to Chest Stretch  Right;Left;1 rep;30 seconds    Single Knee to Chest Stretch Limitations  Manual with therapist     Piriformis Stretch  Right;Left;1 rep;30 seconds    Piriformis Stretch Limitations  Manual with therpaist     Figure 4 Stretch  1 rep;30 seconds    Figure 4 Stretch Limitations  Manual with therapist       Knee/Hip Exercises: Aerobic   Nustep  Lvl 4, 7 min (LE/UE)       Knee/Hip Exercises: Standing   Heel Raises  Both;15 reps    Heel Raises Limitations  at TM rail       Knee/Hip Exercises: Supine   Bridges  Both;10 reps;2 sets;Strengthening    Bridges Limitations  2 sets              PT Education - 05/07/19 1755    Education Details  HEP update; bridge,  heel raise    Person(s) Educated  Patient    Methods   Explanation;Demonstration;Verbal cues;Handout    Comprehension  Verbalized understanding;Returned demonstration;Verbal cues required       PT Short Term Goals - 04/16/19 1749      PT SHORT TERM GOAL #1   Title  Patient to be independent with initial HEP.    Time  4    Period  Weeks    Status  Partially Met   admitting to partial adherence   Target Date  04/07/19        PT Long Term Goals - 05/07/19 1719      PT LONG TERM GOAL #1   Title  Patient to be independent with advanced HEP.    Time  4    Period  Weeks    Status  On-going      PT LONG TERM GOAL #2   Title  Patient to demonstrate no tightness in B piriformis and hip flexors.    Time  4    Period  Weeks    Status  On-going      PT LONG TERM GOAL #3   Title  Patient to demonstrate B LE strength >/=4+/5.    Time  8    Period  Weeks    Status  Achieved      PT LONG TERM GOAL #4   Title  Patient to report 80% improvement in pain levels with STS transfers.    Time  8    Period  Weeks    Status  Partially Met   05/07/19 - noted improved pain levels with STS transfers however unable to quantify improvement           Plan - 05/07/19 1720    Clinical Impression Statement  Pt. reporting his pain with sit<>stand has improved.  LTG #4 partially achieved.  Pt. able to demo B LE strength of at least 4+/5 strength with MMT achieving LTG #3.  Seems to be improving LE flexibility with manual stretching with therapist today and reports much improved adherence to LE stretching as part of HEP.  Updated HEP today.  Pt. requesting LE stretching with therapist thus session focused on LE stretching and selective LE strengthening along with discission of appropriate machines strengthening to be performed at local gym as pt. returning to gym.  Ended session pain free.    Comorbidities  prostate CA, HTN, hx of seizure, HOH    Rehab Potential  Fair    PT Treatment/Interventions  ADLs/Self Care Home  Management;Cryotherapy;Iontophoresis 5m/ml Dexamethasone;Electrical Stimulation;Moist Heat;Therapeutic exercise;Therapeutic activities;Functional mobility training;Stair training;Gait training;Ultrasound;Neuromuscular re-education;Patient/family education;Manual techniques;Taping;Dry needling;Passive range of motion    PT Next Visit Plan  anticipated transition to home program    Consulted and Agree with Plan of Care  Patient       Patient will benefit from skilled therapeutic intervention in order to improve the following deficits and impairments:  Decreased activity tolerance, Decreased strength, Pain, Difficulty walking, Increased muscle spasms, Improper body mechanics, Decreased range of motion, Impaired flexibility, Postural dysfunction  Visit Diagnosis: Chronic right-sided low back pain with right-sided sciatica  Difficulty in walking, not elsewhere classified  Impaired flexibility of lower extremity     Problem List Patient Active Problem List   Diagnosis Date Noted  . Piriformis syndrome of right side 01/15/2019  . Sciatica of right side 09/30/2018  . Morbid obesity ([place] 12/24/2017  . Plantar fasciitis 08/09/2017  . Seasonal allergies 08/09/2017  . Lobar pneumonia ([place] 11/30/2016  . Sepsis due to pneumonia ([place] 11/29/2016  . Essential hypertension 11/29/2016  . Tobacco dependence 11/29/2016  . Prostate Cancer with a Gleason's Score of 3+4 and a PSA of 12.79 01/11/2014    MBess Harvest PTA 05/07/19 6:04 PM   [facility] [address] [place] [address]Phone: [phone]  Fax:  [phone] Name: ESACHA RADLOFFMRN: [id number] of Birth: 8Sep 04, 1962  PHYSICAL THERAPY DISCHARGE SUMMARY  Visits from Start of Care: 4  Current functional level related to goals / functional outcomes: Unable to assess; patient D/C'd d/t N/S policy    Remaining deficits: Unable to assess    Education / Equipment: HEP  Plan: Patient agrees to discharge.  Patient goals were not met. Patient is being discharged due to not returning since the last visit.  ?????     YJanene Harvey PT, DPT 06/25/19 3:55 PM

## 2019-05-07 NOTE — Patient Instructions (Signed)
Keep the diet clean and stay active.  Let us know if you need anything. 

## 2019-05-07 NOTE — Progress Notes (Signed)
Chief Complaint  Patient presents with  . Follow-up    Subjective Peter Camacho is a 59 y.o. male who presents for hypertension follow up. He does not monitor home blood pressures. He is compliant with medications- Prinzide 20-25 mg/d. Patient has these side effects of medication: none He is usually adhering to a healthy diet overall. Current exercise: active at work; some walking    Past Medical History:  Diagnosis Date  . At risk for sleep apnea    STOP-BANG= 5    SENT TO PCP 03-17-2014  . Hard of hearing   . History of seizure    HX menigitis x2  in middle and high school - residual seizure's on medications--  last seizure age 88  . Hypertension   . Personal history of meningitis    X2  MIDDLE AND HIGH SCHOOL--  residual seizure's on medication -- last seizure age 11  . Prostate cancer ([place]) MONITORED BY DR OTTELIN   stage T1c, Gleason 3+4, PSA 12.79, vol 38.5cc    Review of Systems Cardiovascular: no chest pain Respiratory:  no shortness of breath  Exam BP 128/86 (BP Location: Left Arm, Patient Position: Sitting, Cuff Size: Large)   Pulse (!) 102   Temp (!) 96.5 F (35.8 C) (Temporal)   Ht 6' 2.5" (1.892 m)   Wt (!) 326 lb 4 oz (148 kg)   SpO2 96%   BMI 41.33 kg/m  General:  well developed, well nourished, in no apparent distress Heart: RRR, no bruits, no LE edema Lungs: clear to auscultation, no accessory muscle use Psych: well oriented with normal range of affect and appropriate judgment/insight  Essential hypertension  Cont current Prinzide. Counseled on diet and exercise. F/u in 6 mo for CPE. The patient voiced understanding and agreement to the plan.  [place], DO 05/07/19  8:56 AM

## 2019-05-14 ENCOUNTER — Ambulatory Visit: Payer: 59 | Admitting: Physical Therapy

## 2019-05-19 ENCOUNTER — Ambulatory Visit: Payer: 59 | Admitting: Physical Therapy

## 2019-05-26 ENCOUNTER — Ambulatory Visit: Payer: 59 | Admitting: Physical Therapy

## 2019-05-28 ENCOUNTER — Ambulatory Visit: Payer: 59

## 2019-06-23 ENCOUNTER — Other Ambulatory Visit: Payer: Self-pay | Admitting: Family Medicine

## 2019-06-23 DIAGNOSIS — J302 Other seasonal allergic rhinitis: Secondary | ICD-10-CM

## 2019-08-08 IMAGING — CR DG CHEST 2V
2 series · 2 of 2 positions shown · non-contrast
Comparison: Chest x-ray of June 26, 2014

CLINICAL DATA: Cough shortness of breath, head congestion, and
bilateral ear pain. Current smoker. History of hypertension.

EXAM:
CHEST  2 VIEW

[w chest pa *]
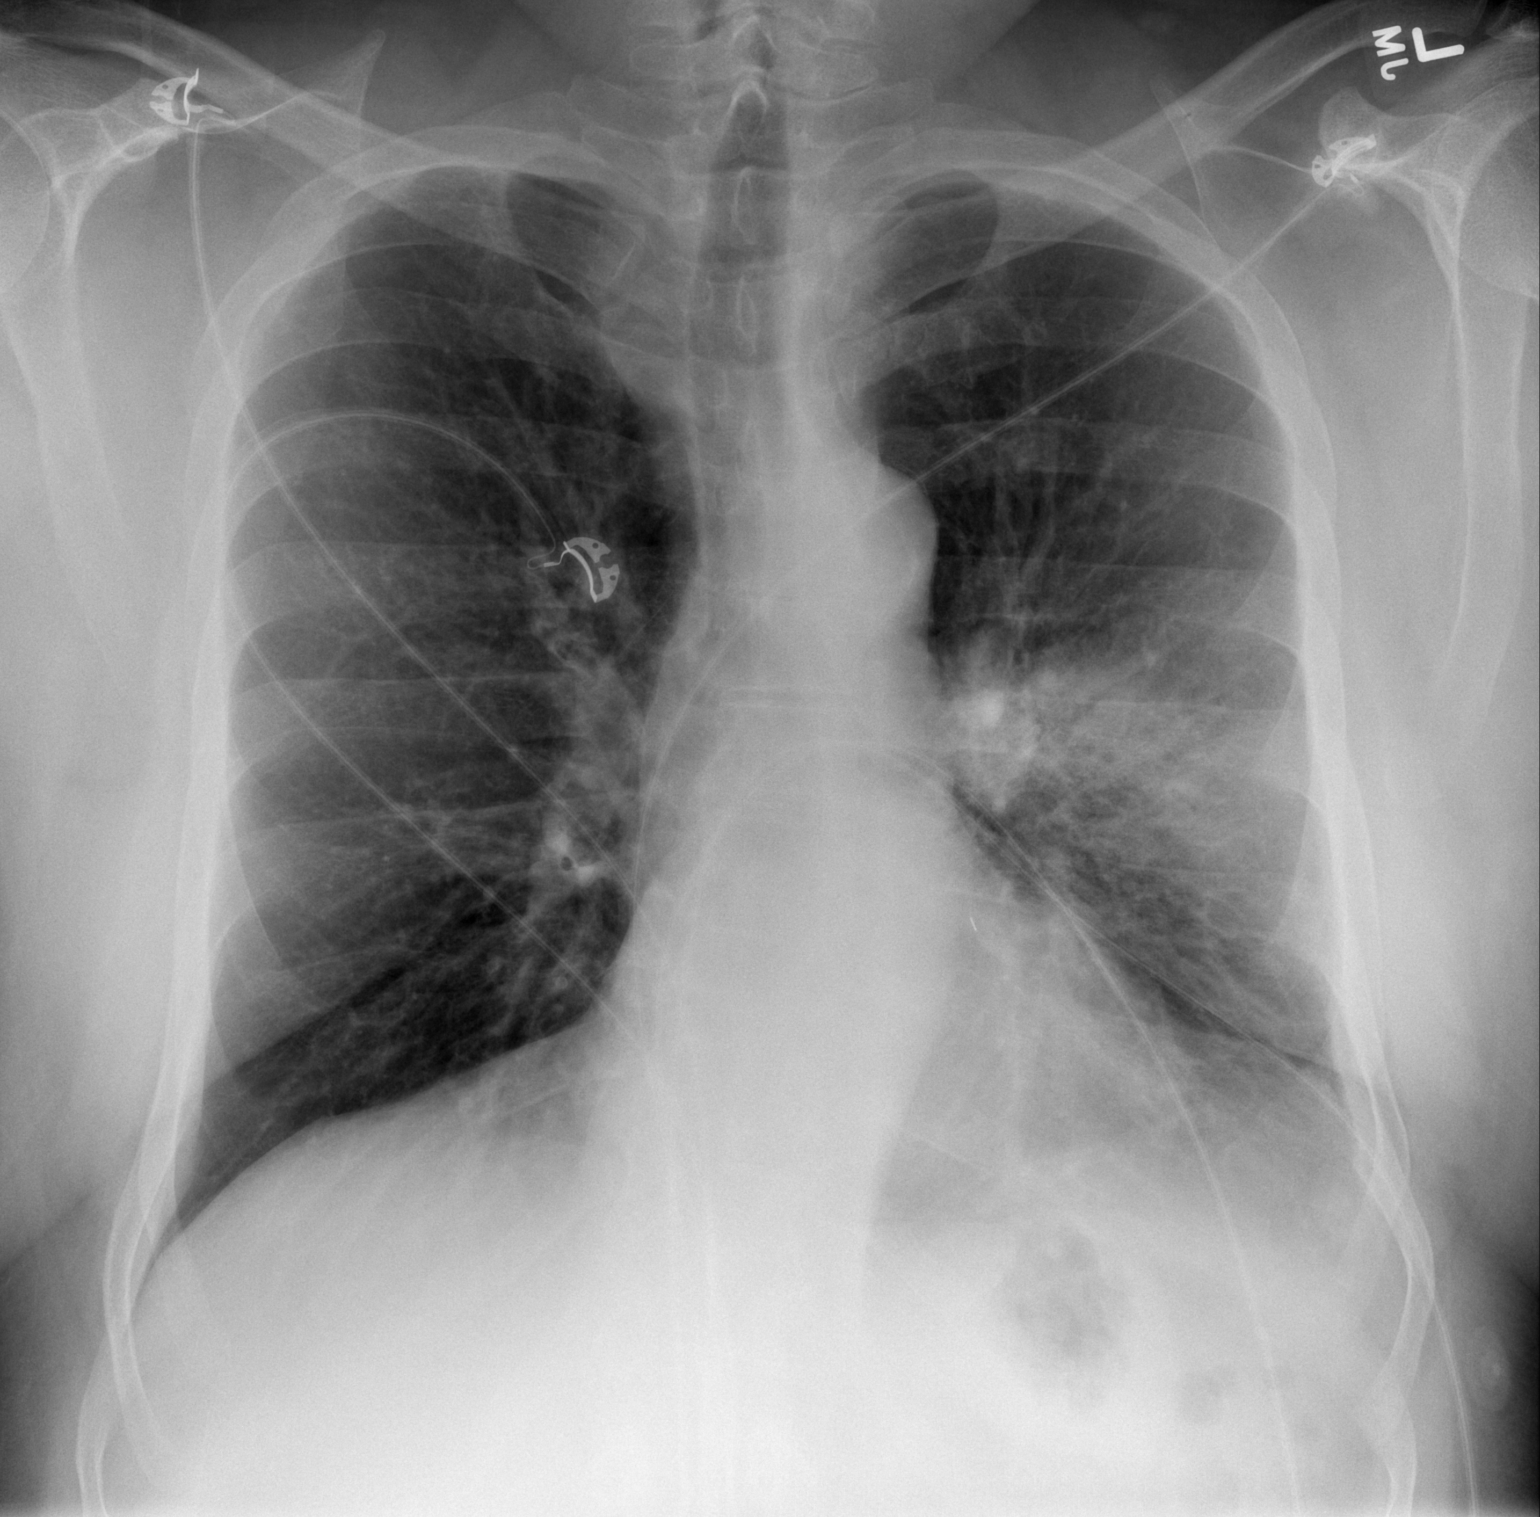

[w chest lat *]
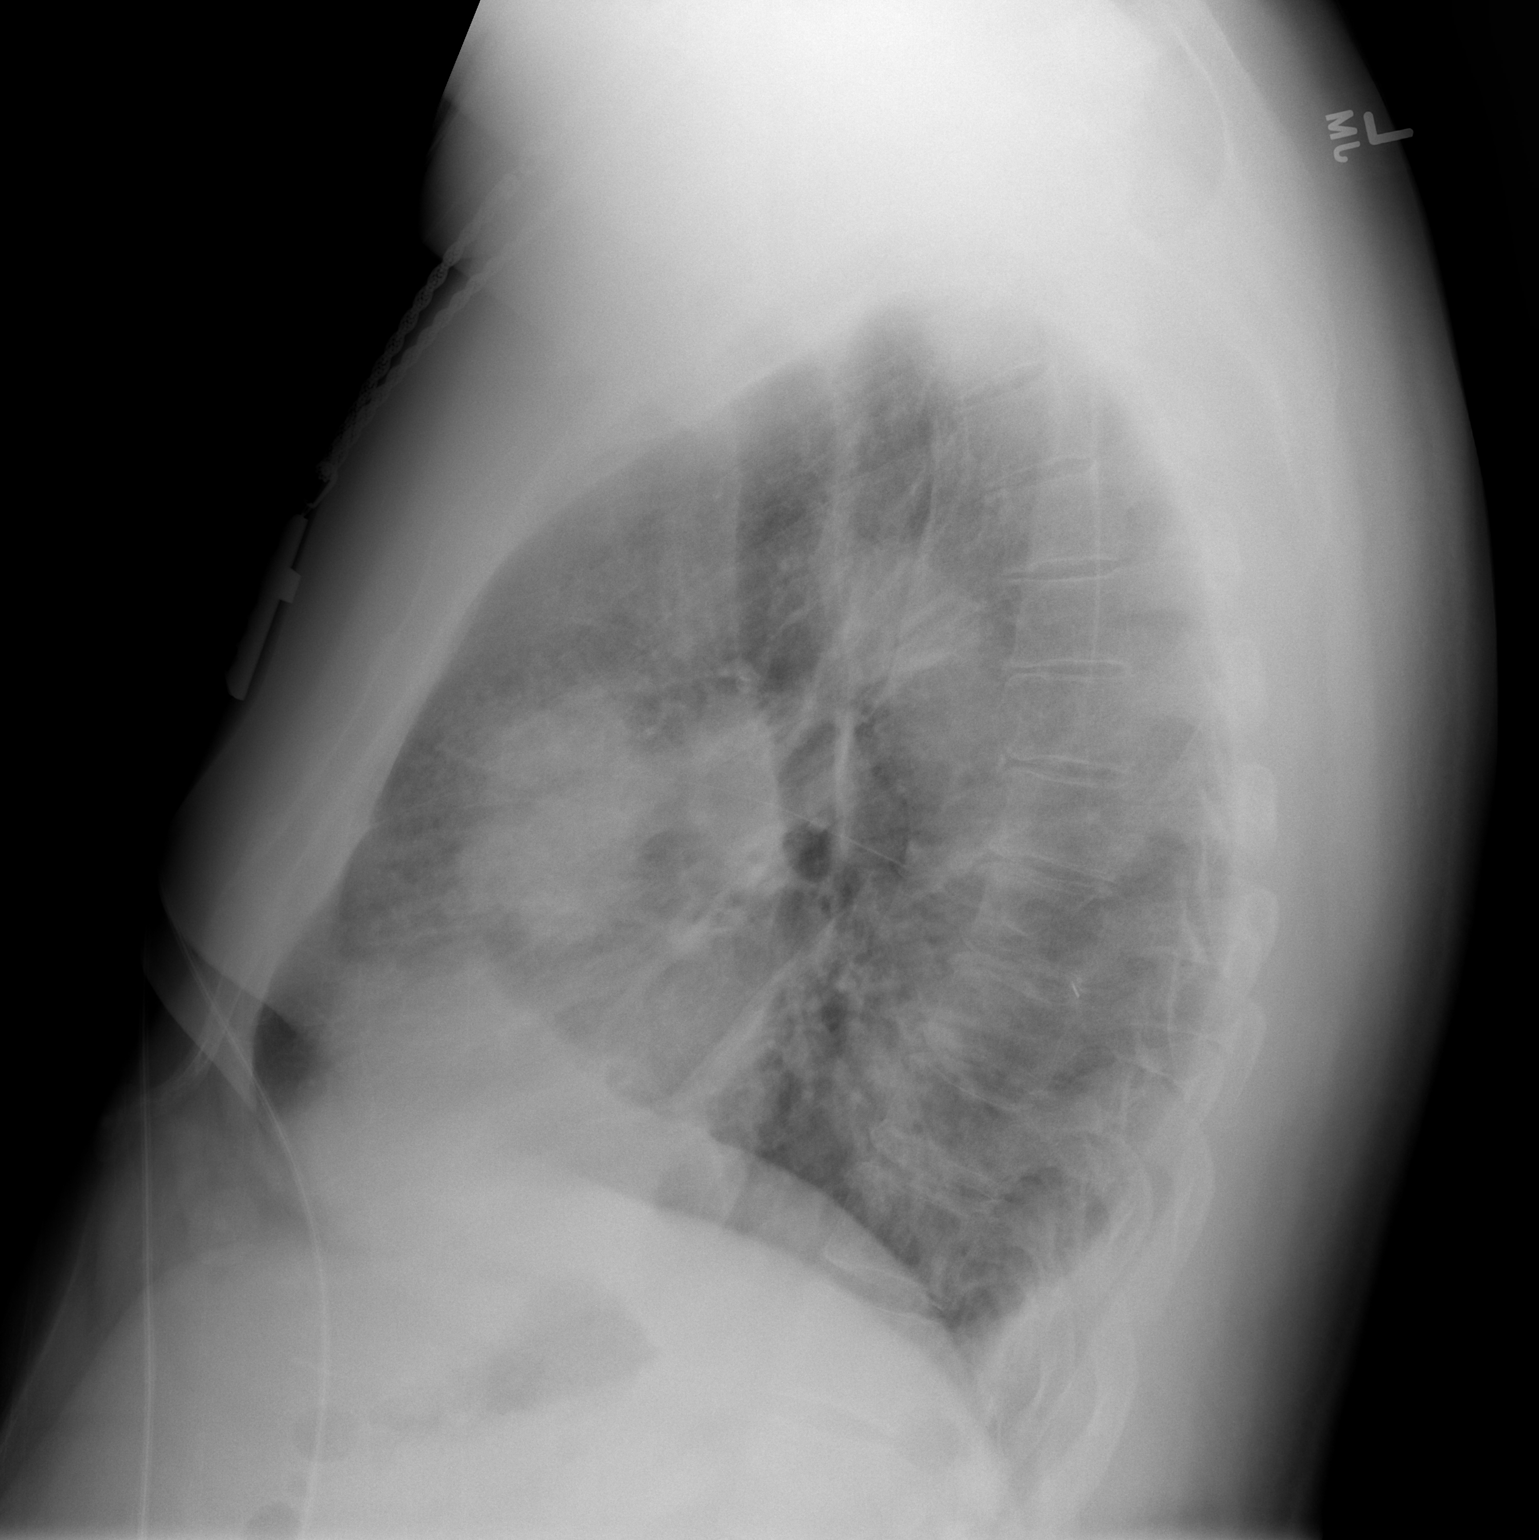

[2 of 2 positions shown; findings below may reference images not displayed]

FINDINGS: There is patchy increased density in the anterior right perihilar
region. The appearance is masslike on the lateral view. There is a
trace of pleural fluid on the left. The right lung is clear. The
heart and pulmonary vascularity are normal. There is calcification
in the wall of the aortic arch. There is multilevel degenerative
disc disease of the thoracic spine.
IMPRESSION: New abnormal parenchymal consolidation in the anterior inferior
aspect of the left upper lobe. The findings likely reflect
pneumonia. Followup PA and lateral chest X-ray is recommended in 3-4
weeks following trial of antibiotic therapy to ensure resolution and
exclude underlying malignancy.

Thoracic aortic atherosclerosis.

## 2019-08-09 ENCOUNTER — Other Ambulatory Visit: Payer: Self-pay

## 2019-08-09 ENCOUNTER — Inpatient Hospital Stay (HOSPITAL_BASED_OUTPATIENT_CLINIC_OR_DEPARTMENT_OTHER)
Admission: EM | Admit: 2019-08-09 | Discharge: 2019-08-13 | DRG: 378 | Disposition: A | Discharge status: Home / Self Care | Payer: 59 | Attending: Internal Medicine | Admitting: Internal Medicine

## 2019-08-09 DIAGNOSIS — I10 Essential (primary) hypertension: Secondary | ICD-10-CM | POA: Diagnosis not present

## 2019-08-09 DIAGNOSIS — D62 Acute posthemorrhagic anemia: Secondary | ICD-10-CM | POA: Diagnosis present

## 2019-08-09 DIAGNOSIS — Z833 Family history of diabetes mellitus: Secondary | ICD-10-CM

## 2019-08-09 DIAGNOSIS — Z8546 Personal history of malignant neoplasm of prostate: Secondary | ICD-10-CM

## 2019-08-09 DIAGNOSIS — H9190 Unspecified hearing loss, unspecified ear: Secondary | ICD-10-CM | POA: Diagnosis not present

## 2019-08-09 DIAGNOSIS — K264 Chronic or unspecified duodenal ulcer with hemorrhage: Principal | ICD-10-CM | POA: Diagnosis present

## 2019-08-09 DIAGNOSIS — Z8661 Personal history of infections of the central nervous system: Secondary | ICD-10-CM | POA: Diagnosis not present

## 2019-08-09 DIAGNOSIS — R7303 Prediabetes: Secondary | ICD-10-CM | POA: Diagnosis not present

## 2019-08-09 DIAGNOSIS — Z20822 Contact with and (suspected) exposure to covid-19: Secondary | ICD-10-CM | POA: Diagnosis not present

## 2019-08-09 DIAGNOSIS — D500 Iron deficiency anemia secondary to blood loss (chronic): Secondary | ICD-10-CM

## 2019-08-09 DIAGNOSIS — I9589 Other hypotension: Secondary | ICD-10-CM | POA: Diagnosis not present

## 2019-08-09 DIAGNOSIS — K922 Gastrointestinal hemorrhage, unspecified: Secondary | ICD-10-CM | POA: Diagnosis present

## 2019-08-09 DIAGNOSIS — R000 Tachycardia, unspecified: Secondary | ICD-10-CM | POA: Diagnosis not present

## 2019-08-09 DIAGNOSIS — D649 Anemia, unspecified: Secondary | ICD-10-CM

## 2019-08-09 DIAGNOSIS — R310 Gross hematuria: Secondary | ICD-10-CM | POA: Diagnosis present

## 2019-08-09 DIAGNOSIS — Z6841 Body Mass Index (BMI) 40.0 and over, adult: Secondary | ICD-10-CM

## 2019-08-09 DIAGNOSIS — E861 Hypovolemia: Secondary | ICD-10-CM

## 2019-08-09 DIAGNOSIS — K2970 Gastritis, unspecified, without bleeding: Secondary | ICD-10-CM | POA: Diagnosis not present

## 2019-08-09 DIAGNOSIS — N179 Acute kidney failure, unspecified: Secondary | ICD-10-CM | POA: Diagnosis present

## 2019-08-09 DIAGNOSIS — Z8249 Family history of ischemic heart disease and other diseases of the circulatory system: Secondary | ICD-10-CM

## 2019-08-09 LAB — PROTIME-INR
INR: 1.1 (ref 0.8–1.2)
Prothrombin Time: 13.8 seconds (ref 11.4–15.2)

## 2019-08-09 LAB — CBC
HCT: 25.3 % — ABNORMAL LOW (ref 39.0–52.0)
Hemoglobin: 8.3 g/dL — ABNORMAL LOW (ref 13.0–17.0)
MCH: 30.4 pg (ref 26.0–34.0)
MCHC: 32.8 g/dL (ref 30.0–36.0)
MCV: 92.7 fL (ref 80.0–100.0)
Platelets: 233 10*3/uL (ref 150–400)
RBC: 2.73 MIL/uL — ABNORMAL LOW (ref 4.22–5.81)
RDW: 15.6 % — ABNORMAL HIGH (ref 11.5–15.5)
WBC: 19.4 10*3/uL — ABNORMAL HIGH (ref 4.0–10.5)
nRBC: 0 % (ref 0.0–0.2)

## 2019-08-09 LAB — COMPREHENSIVE METABOLIC PANEL
ALT: 14 U/L (ref 0–44)
AST: 16 U/L (ref 15–41)
Albumin: 3.2 g/dL — ABNORMAL LOW (ref 3.5–5.0)
Alkaline Phosphatase: 67 U/L (ref 38–126)
Anion gap: 12 (ref 5–15)
BUN: 41 mg/dL — ABNORMAL HIGH (ref 6–20)
CO2: 24 mmol/L (ref 22–32)
Calcium: 8.1 mg/dL — ABNORMAL LOW (ref 8.9–10.3)
Chloride: 101 mmol/L (ref 98–111)
Creatinine, Ser: 1.43 mg/dL — ABNORMAL HIGH (ref 0.61–1.24)
GFR calc Af Amer: >60 mL/min (ref >60)
GFR calc non Af Amer: 53 mL/min — ABNORMAL LOW (ref >60)
Glucose, Bld: 126 mg/dL — ABNORMAL HIGH (ref 70–99)
Potassium: 3.6 mmol/L (ref 3.5–5.1)
Sodium: 137 mmol/L (ref 135–145)
Total Bilirubin: 0.3 mg/dL (ref 0.3–1.2)
Total Protein: 6.4 g/dL — ABNORMAL LOW (ref 6.5–8.1)

## 2019-08-09 LAB — LACTIC ACID, PLASMA: Lactic Acid, Venous: 2 mmol/L (ref 0.5–1.9)

## 2019-08-09 LAB — CBG MONITORING, ED: Glucose-Capillary: 118 mg/dL — ABNORMAL HIGH (ref 70–99)

## 2019-08-09 LAB — OCCULT BLOOD X 1 CARD TO LAB, STOOL: Fecal Occult Bld: POSITIVE — AB

## 2019-08-09 MED ORDER — SODIUM CHLORIDE 0.9 % IV SOLN: INTRAVENOUS | Status: DC

## 2019-08-09 MED ORDER — SODIUM CHLORIDE 0.9 % IV SOLN
80.0000 mg | Freq: Once | INTRAVENOUS | Status: AC
  Administered 2019-08-09: 80 mg via INTRAVENOUS
  Filled 2019-08-09: qty 80

## 2019-08-09 MED ORDER — PANTOPRAZOLE SODIUM 40 MG IV SOLR
INTRAVENOUS | Status: AC
  Filled 2019-08-09: qty 80

## 2019-08-09 MED ORDER — SODIUM CHLORIDE 0.9 % IV BOLUS
1000.0000 mL | Freq: Once | INTRAVENOUS | Status: AC
  Administered 2019-08-09: 1000 mL via INTRAVENOUS

## 2019-08-09 NOTE — ED Notes (Signed)
Pt offered BSC, pt refused; pt educated on risks of ambulating due to current medical condition, pt still refusing and saying he is going to the bathroom; pt taken to bathroom in wheelchair and instructed to pull the call bell when finished; pt verbalized understanding

## 2019-08-09 NOTE — ED Notes (Signed)
Pt told to pull call bell when finished using the bathroom; pt seen ambulating out of the bathroom pushing the wheelchair in front of him

## 2019-08-09 NOTE — ED Provider Notes (Signed)
[facility] EMERGENCY DEPARTMENT Provider Note   CSN: [id number] Arrival date & time: 08/09/19  2145     History Chief Complaint  Patient presents with  . Weakness    Peter Camacho is a 59 y.o. male.  Patient with history of prostate cancer, sepsis, hearing loss --presents the emergency department today with generalized weakness, diarrhea with blood noted in the stool, hematuria starting yesterday.  Reports diarrhea with dark stool. Patient called his friend tonight for transport to the hospital.  Friend states that he is more quiet than normal.  Patient found to be hypotensive and tachycardic on arrival.  Patient denies any use of blood thinners.  He denies any abdominal pain.  No chest pain.  He feels lightheaded.  No difficulty breathing.  Denies history of GI bleed to me.  No previous colonoscopies noted in epic.  Denies EtOH use or heavy NSAID use. The onset of this condition was acute. The course is worsening. Aggravating factors: none. Alleviating factors: none.          Past Medical History:  Diagnosis Date  . At risk for sleep apnea    STOP-BANG= 5    SENT TO PCP 03-17-2014  . Hard of hearing   . History of seizure    HX menigitis x2  in middle and high school - residual seizure's on medications--  last seizure age 25  . Hypertension   . Personal history of meningitis    X2  MIDDLE AND HIGH SCHOOL--  residual seizure's on medication -- last seizure age 40  . Prostate cancer ([place]) MONITORED BY DR OTTELIN   stage T1c, Gleason 3+4, PSA 12.79, vol 38.5cc    Patient Active Problem List   Diagnosis Date Noted  . Piriformis syndrome of right side 01/15/2019  . Sciatica of right side 09/30/2018  . Morbid obesity ([place]) 12/24/2017  . Plantar fasciitis 08/09/2017  . Seasonal allergies 08/09/2017  . Lobar pneumonia ([place]) 11/30/2016  . Sepsis due to pneumonia ([place]) 11/29/2016  . Essential hypertension 11/29/2016  . Tobacco dependence 11/29/2016  . Prostate  Cancer with a Gleason's Score of 3+4 and a PSA of 12.79 01/11/2014    Past Surgical History:  Procedure Laterality Date  . PROSTATE BIOPSY    . RADIOACTIVE SEED IMPLANT N/A 03/20/2014   Procedure: RADIOACTIVE SEED IMPLANT;  Surgeon: Claybon Jabs, MD;  Location: [facility];  Service: Urology;  Laterality: N/A;       Family History  Problem Relation Age of Onset  . CAD Mother 64  . Diabetes Mother   . CAD Father 56  . Cancer Neg Hx     Social History   Tobacco Use  . Smoking status: Former Smoker    Packs/day: 0.30    Years: 40.00    Pack years: 12.00    Types: Cigarettes  . Smokeless tobacco: Never Used  . Tobacco comment: quit 11/11/16  Substance Use Topics  . Alcohol use: No  . Drug use: No    Home Medications Prior to Admission medications   Medication Sig Start Date End Date Taking? Authorizing Provider  acetaminophen (TYLENOL) 325 MG tablet Take 2 tablets (650 mg total) by mouth every 6 (six) hours as needed. 02/24/18   Caccavale, Sophia, PA-C  albuterol (VENTOLIN HFA) 108 (90 Base) MCG/ACT inhaler TAKE 2 PUFFS BY MOUTH EVERY 6 HOURS AS NEEDED FOR WHEEZE OR SHORTNESS OF BREATH 06/23/19   Wendling, [name], DO  levocetirizine (XYZAL) 5 MG tablet  Take 1 tablet (5 mg total) by mouth every evening. 10/29/18   [name], DO  lisinopril-hydrochlorothiazide (ZESTORETIC) 20-25 MG tablet TAKE 1 TABLET BY MOUTH EVERY DAY 03/21/19   Wendling, [name], DO  silver sulfADIAZINE (SILVADENE) 1 % cream Apply 1 application topically daily. 05/07/19   [name], DO    Allergies    Fish-derived products  Review of Systems   Review of Systems  Constitutional: Negative for fever.  HENT: Negative for rhinorrhea and sore throat.   Eyes: Negative for redness.  Respiratory: Negative for cough.   Cardiovascular: Negative for chest pain.  Gastrointestinal: Positive for blood in stool. Negative for abdominal pain, diarrhea, nausea and  vomiting.  Genitourinary: Positive for hematuria. Negative for dysuria.  Musculoskeletal: Negative for myalgias.  Skin: Negative for rash.  Neurological: Positive for weakness and light-headedness. Negative for headaches.  Hematological: Does not bruise/bleed easily.    Physical Exam Updated Vital Signs BP (!) 89/70   Pulse (!) 121   Temp 97.6 F (36.4 C) (Oral)   Resp 15   Wt (!) 152 kg   SpO2 100%   BMI 42.44 kg/m   Physical Exam Vitals and nursing note reviewed. Exam conducted with a chaperone present.  Constitutional:      Appearance: He is well-developed.  HENT:     Head: Normocephalic and atraumatic.     Mouth/Throat:     Mouth: Mucous membranes are moist.  Eyes:     General:        Right eye: No discharge.        Left eye: No discharge.     Comments: Pale conjunctiva.   Cardiovascular:     Rate and Rhythm: Regular rhythm. Tachycardia present.     Heart sounds: Normal heart sounds.  Pulmonary:     Effort: Pulmonary effort is normal.     Breath sounds: Normal breath sounds.  Abdominal:     Palpations: Abdomen is soft.     Tenderness: There is no abdominal tenderness.  Genitourinary:    Rectum: Guaiac result positive. Tenderness and external hemorrhoid present. No mass.  Musculoskeletal:     Cervical back: Normal range of motion and neck supple.  Skin:    General: Skin is warm and dry.  Neurological:     Mental Status: He is alert.     ED Results / Procedures / Treatments   Labs (all labs ordered are listed, but only abnormal results are displayed) Labs Reviewed  CBC - Abnormal; Notable for the following components:      Result Value   WBC 19.4 (*)    RBC 2.73 (*)    Hemoglobin 8.3 (*)    HCT 25.3 (*)    RDW 15.6 (*)    All other components within normal limits  COMPREHENSIVE METABOLIC PANEL - Abnormal; Notable for the following components:   Glucose, Bld 126 (*)    BUN 41 (*)    Creatinine, Ser 1.43 (*)    Calcium 8.1 (*)    Total Protein 6.4  (*)    Albumin 3.2 (*)    GFR calc non Af Amer 53 (*)    All other components within normal limits  OCCULT BLOOD X 1 CARD TO LAB, STOOL - Abnormal; Notable for the following components:   Fecal Occult Bld POSITIVE (*)    All other components within normal limits  LACTIC ACID, PLASMA - Abnormal; Notable for the following components:   Lactic Acid, Venous 2.0 (*)  All other components within normal limits  CBG MONITORING, ED - Abnormal; Notable for the following components:   Glucose-Capillary 118 (*)    All other components within normal limits  SARS CORONAVIRUS 2 BY RT PCR (HOSPITAL ORDER, [facility])  PROTIME-INR  URINALYSIS, ROUTINE W REFLEX MICROSCOPIC    EKG EKG Interpretation  Date/Time:  Saturday August 09 2019 22:10:14 EDT Ventricular Rate:  121 PR Interval:    QRS Duration: 92 QT Interval:  313 QTC Calculation: 444 R Axis:   39 Text Interpretation: Sinus tachycardia Abnormal R-wave progression, early transition Nonspecific repol abnormality, diffuse leads No significant change since last tracing Confirmed by Blanchie Dessert [id number]) on 08/09/2019 10:18:39 PM   Radiology No results found.  Procedures Procedures (including critical care time)  Medications Ordered in ED Medications  pantoprazole (PROTONIX) 80 mg in sodium chloride 0.9 % 100 mL IVPB (80 mg Intravenous New Bag/Given 08/09/19 2342)  pantoprazole (PROTONIX) 40 MG injection (has no administration in time range)  0.9 %  sodium chloride infusion (has no administration in time range)  sodium chloride 0.9 % bolus 1,000 mL (0 mLs Intravenous Stopped 08/09/19 2238)    ED Course  I have reviewed the triage vital signs and the nursing notes.  Pertinent labs & imaging results that were available during my care of the patient were reviewed by me and considered in my medical decision making (see chart for details).  Patient seen and examined. See at bedside shortly after arrival.   Resuscitation started with fluids.  History per friend and patient at bedside.  No blood thinners.  Rectal exam performed --maroon blood noted on finger.  Vital signs reviewed and are as follows: BP (!) 89/70   Pulse (!) 121   Temp 97.6 F (36.4 C) (Oral)   Resp 15   Wt (!) 152 kg   SpO2 100%   BMI 42.44 kg/m    11:05 PM vital signs improving with IV fluids.  At the current time heart rate 110 with a blood pressure of 99/65.  Awaiting CMP then will call for admission.  11:08 PM Calling for admission.   11:24 PM Spoke with Dr. Sidney Ace who accepts for admission.   11:51 PM patient updated on plan and recheck.  Again, no focal abdominal pain on exam.  Discussed need for blood transfusion with patient.  Discussed that he would be going to [facility].  Patient and friend at bedside verbalized understanding.  Ordered protonix bolus, NS 125cc/hr, COVID swab.   CRITICAL CARE Performed by: Carlisle Cater PA-C Total critical care time: 45 minutes Critical care time was exclusive of separately billable procedures and treating other patients. Critical care was necessary to treat or prevent imminent or life-threatening deterioration. Critical care was time spent personally by me on the following activities: development of treatment plan with patient and/or surrogate as well as nursing, discussions with consultants, evaluation of patient's response to treatment, examination of patient, obtaining history from patient or surrogate, ordering and performing treatments and interventions, ordering and review of laboratory studies, ordering and review of radiographic studies, pulse oximetry and re-evaluation of patient's condition.     MDM Rules/Calculators/A&P                          Admit.   Final Clinical Impression(s) / ED Diagnoses Final diagnoses:  Gastrointestinal hemorrhage, unspecified gastrointestinal hemorrhage type  Hypotension due to hypovolemia    Rx / DC Orders  ED  Discharge Orders    None       Carlisle Cater, Hershal Coria 08/09/19 2353    Blanchie Dessert, MD 08/10/19 [phone]

## 2019-08-09 NOTE — ED Triage Notes (Signed)
Pt arrives pov with friend, c/o weakness, hematuria and tarry stools since yesterday. Diaphoresis noted. Hypotensive, tachycardic in triage.

## 2019-08-09 NOTE — ED Notes (Signed)
[facility] notified (Tammy) - hospitalist consult

## 2019-08-09 NOTE — ED Notes (Signed)
Dr. Maryan Rued aware that lactic acid was 2.0

## 2019-08-10 ENCOUNTER — Inpatient Hospital Stay (HOSPITAL_COMMUNITY): Payer: 59 | Admitting: Anesthesiology

## 2019-08-10 ENCOUNTER — Other Ambulatory Visit: Payer: Self-pay

## 2019-08-10 ENCOUNTER — Encounter (HOSPITAL_COMMUNITY): Payer: Self-pay | Admitting: Family Medicine

## 2019-08-10 ENCOUNTER — Encounter (HOSPITAL_COMMUNITY)
Admission: EM | Disposition: A | Discharge status: Home / Self Care | Payer: Self-pay | Source: Home / Self Care | Attending: Internal Medicine

## 2019-08-10 DIAGNOSIS — K921 Melena: Secondary | ICD-10-CM | POA: Diagnosis not present

## 2019-08-10 DIAGNOSIS — H9190 Unspecified hearing loss, unspecified ear: Secondary | ICD-10-CM | POA: Diagnosis present

## 2019-08-10 DIAGNOSIS — K269 Duodenal ulcer, unspecified as acute or chronic, without hemorrhage or perforation: Secondary | ICD-10-CM | POA: Diagnosis not present

## 2019-08-10 DIAGNOSIS — Z6841 Body Mass Index (BMI) 40.0 and over, adult: Secondary | ICD-10-CM | POA: Diagnosis not present

## 2019-08-10 DIAGNOSIS — D62 Acute posthemorrhagic anemia: Secondary | ICD-10-CM

## 2019-08-10 DIAGNOSIS — D500 Iron deficiency anemia secondary to blood loss (chronic): Secondary | ICD-10-CM | POA: Diagnosis not present

## 2019-08-10 DIAGNOSIS — K264 Chronic or unspecified duodenal ulcer with hemorrhage: Secondary | ICD-10-CM | POA: Diagnosis present

## 2019-08-10 DIAGNOSIS — N179 Acute kidney failure, unspecified: Secondary | ICD-10-CM | POA: Diagnosis present

## 2019-08-10 DIAGNOSIS — R000 Tachycardia, unspecified: Secondary | ICD-10-CM | POA: Diagnosis present

## 2019-08-10 DIAGNOSIS — Z8249 Family history of ischemic heart disease and other diseases of the circulatory system: Secondary | ICD-10-CM | POA: Diagnosis not present

## 2019-08-10 DIAGNOSIS — R7303 Prediabetes: Secondary | ICD-10-CM | POA: Diagnosis present

## 2019-08-10 DIAGNOSIS — K922 Gastrointestinal hemorrhage, unspecified: Secondary | ICD-10-CM

## 2019-08-10 DIAGNOSIS — Z8546 Personal history of malignant neoplasm of prostate: Secondary | ICD-10-CM | POA: Diagnosis not present

## 2019-08-10 DIAGNOSIS — I10 Essential (primary) hypertension: Secondary | ICD-10-CM | POA: Diagnosis present

## 2019-08-10 DIAGNOSIS — I9589 Other hypotension: Secondary | ICD-10-CM | POA: Diagnosis not present

## 2019-08-10 DIAGNOSIS — Z8661 Personal history of infections of the central nervous system: Secondary | ICD-10-CM | POA: Diagnosis not present

## 2019-08-10 DIAGNOSIS — K2970 Gastritis, unspecified, without bleeding: Secondary | ICD-10-CM | POA: Diagnosis present

## 2019-08-10 DIAGNOSIS — Z20822 Contact with and (suspected) exposure to covid-19: Secondary | ICD-10-CM | POA: Diagnosis present

## 2019-08-10 DIAGNOSIS — E861 Hypovolemia: Secondary | ICD-10-CM | POA: Diagnosis not present

## 2019-08-10 DIAGNOSIS — Z833 Family history of diabetes mellitus: Secondary | ICD-10-CM | POA: Diagnosis not present

## 2019-08-10 DIAGNOSIS — R310 Gross hematuria: Secondary | ICD-10-CM | POA: Diagnosis present

## 2019-08-10 DIAGNOSIS — D649 Anemia, unspecified: Secondary | ICD-10-CM | POA: Diagnosis not present

## 2019-08-10 HISTORY — PX: HEMOSTASIS CONTROL: SHX6838

## 2019-08-10 HISTORY — PX: SCLEROTHERAPY: SHX6841

## 2019-08-10 HISTORY — PX: HOT HEMOSTASIS: SHX5433

## 2019-08-10 HISTORY — PX: ESOPHAGOGASTRODUODENOSCOPY (EGD) WITH PROPOFOL: SHX5813

## 2019-08-10 LAB — PREPARE RBC (CROSSMATCH)

## 2019-08-10 LAB — SARS CORONAVIRUS 2 BY RT PCR (HOSPITAL ORDER, PERFORMED IN ~~LOC~~ HOSPITAL LAB): SARS Coronavirus 2: NEGATIVE

## 2019-08-10 LAB — HEMOGLOBIN AND HEMATOCRIT, BLOOD
HCT: 24.3 % — ABNORMAL LOW (ref 39.0–52.0)
Hemoglobin: 8 g/dL — ABNORMAL LOW (ref 13.0–17.0)

## 2019-08-10 LAB — ABO/RH: ABO/RH(D): O POS

## 2019-08-10 SURGERY — ESOPHAGOGASTRODUODENOSCOPY (EGD) WITH PROPOFOL
Anesthesia: Monitor Anesthesia Care

## 2019-08-10 MED ORDER — LACTATED RINGERS IV SOLN
INTRAVENOUS | Status: AC | PRN
  Administered 2019-08-10: 1000 mL via INTRAVENOUS

## 2019-08-10 MED ORDER — PROPOFOL 10 MG/ML IV BOLUS
INTRAVENOUS | Status: DC | PRN
  Administered 2019-08-10 (×2): 20 mg via INTRAVENOUS

## 2019-08-10 MED ORDER — EPINEPHRINE 1 MG/10ML IJ SOSY
PREFILLED_SYRINGE | INTRAMUSCULAR | Status: AC
  Filled 2019-08-10: qty 10

## 2019-08-10 MED ORDER — SODIUM CHLORIDE 0.9% FLUSH
3.0000 mL | Freq: Two times a day (BID) | INTRAVENOUS | Status: DC
  Administered 2019-08-11 (×2): 3 mL via INTRAVENOUS

## 2019-08-10 MED ORDER — ACETAMINOPHEN 650 MG RE SUPP: 650.0000 mg | Freq: Four times a day (QID) | RECTAL | Status: DC | PRN

## 2019-08-10 MED ORDER — PROPOFOL 10 MG/ML IV BOLUS
INTRAVENOUS | Status: AC
  Filled 2019-08-10: qty 20

## 2019-08-10 MED ORDER — SODIUM CHLORIDE 0.9 % IV SOLN: INTRAVENOUS | Status: AC

## 2019-08-10 MED ORDER — LIDOCAINE 2% (20 MG/ML) 5 ML SYRINGE
INTRAMUSCULAR | Status: DC | PRN
  Administered 2019-08-10: 100 mg via INTRAVENOUS

## 2019-08-10 MED ORDER — PROPOFOL 500 MG/50ML IV EMUL
INTRAVENOUS | Status: DC | PRN
  Administered 2019-08-10: 100 ug/kg/min via INTRAVENOUS

## 2019-08-10 MED ORDER — ONDANSETRON HCL 4 MG PO TABS: 4.0000 mg | ORAL_TABLET | Freq: Four times a day (QID) | ORAL | Status: DC | PRN

## 2019-08-10 MED ORDER — SODIUM CHLORIDE 0.9 % IV BOLUS
1000.0000 mL | Freq: Once | INTRAVENOUS | Status: AC
  Administered 2019-08-10: 1000 mL via INTRAVENOUS

## 2019-08-10 MED ORDER — SODIUM CHLORIDE 0.9 % IV SOLN
8.0000 mg/h | INTRAVENOUS | Status: DC
  Administered 2019-08-10 – 2019-08-12 (×6): 8 mg/h via INTRAVENOUS
  Filled 2019-08-10 (×11): qty 80

## 2019-08-10 MED ORDER — FENTANYL CITRATE (PF) 100 MCG/2ML IJ SOLN
25.0000 ug | INTRAMUSCULAR | Status: DC | PRN
  Administered 2019-08-10: 50 ug via INTRAVENOUS
  Filled 2019-08-10: qty 2

## 2019-08-10 MED ORDER — ONDANSETRON HCL 4 MG/2ML IJ SOLN: 4.0000 mg | Freq: Four times a day (QID) | INTRAMUSCULAR | Status: DC | PRN

## 2019-08-10 MED ORDER — PHENYLEPHRINE 40 MCG/ML (10ML) SYRINGE FOR IV PUSH (FOR BLOOD PRESSURE SUPPORT)
PREFILLED_SYRINGE | INTRAVENOUS | Status: DC | PRN
  Administered 2019-08-10: 80 ug via INTRAVENOUS

## 2019-08-10 MED ORDER — SODIUM CHLORIDE 0.9% IV SOLUTION: Freq: Once | INTRAVENOUS | Status: DC

## 2019-08-10 MED ORDER — SODIUM CHLORIDE (PF) 0.9 % IJ SOLN
PREFILLED_SYRINGE | INTRAMUSCULAR | Status: DC | PRN
  Administered 2019-08-10: 4 mL

## 2019-08-10 MED ORDER — PANTOPRAZOLE SODIUM 40 MG IV SOLR: 40.0000 mg | Freq: Two times a day (BID) | INTRAVENOUS | Status: DC

## 2019-08-10 MED ORDER — PROMETHAZINE HCL 25 MG/ML IJ SOLN
12.5000 mg | Freq: Four times a day (QID) | INTRAMUSCULAR | Status: DC | PRN
  Administered 2019-08-10: 12.5 mg via INTRAVENOUS
  Filled 2019-08-10: qty 1

## 2019-08-10 MED ORDER — ACETAMINOPHEN 325 MG PO TABS: 650.0000 mg | ORAL_TABLET | Freq: Four times a day (QID) | ORAL | Status: DC | PRN

## 2019-08-10 MED ORDER — LACTATED RINGERS IV SOLN: INTRAVENOUS | Status: DC | PRN

## 2019-08-10 SURGICAL SUPPLY — 15 items

## 2019-08-10 NOTE — ED Notes (Signed)
Unable to collect bloodwork at this time, phlebotomy called.

## 2019-08-10 NOTE — Progress Notes (Signed)
Peter Camacho is a 59 y.o. male with a history of hypertension and morbid obesity who was admitted early this morning by Dr. Myna Hidalgo for GI bleeding and gross hematuria.   Underwent endoscopy today with Dr. Fuller Plan: Distal esophageal erythema, gastritis, nonbleeding duodenal ulcer with clot underlying visible vessel which was injected and treated with bipolar cautery and nonbleeding duodenal ulcer with no stigmata of bleeding.  Currently, patient has many questions regarding his overall health and asked how he could lose some weight.  Reported that he drinks a 2 L bottle of soda daily and will eat anything every day.  He was advised to cut out soda and fried foods for now.  Requested to see the dietitian while inpatient.  Also states that he urinates frequently but denies any increased thirst.  No known history of diabetes.   PE: General: Awake and alert, no acute distress  Heart: S1 and S2 auscultated, no murmurs  Lungs: Clear to auscultation bilaterally, no wheeze  ABD: Nontender, nondistended   A/P  1. UGI bleed s/p EGD on 6/13 a. EGD: Distal esophageal erythema, gastritis, nonbleeding duodenal ulcer with clot underlying visible vessel which was injected and treated with bipolar cautery and nonbleeding duodenal ulcer with no stigmata of bleeding. b. Follow-up H. pylori antibody c. Per GI: Clear liquid diet for now, 72 hours IV Protonix, no NSAIDs, trend CBC 2. Morbid Obesity Secondary to high caloric intake with poor diet a. We will check HA1C and lipid panel and ask dietitian to see patient for dietary education b. Advised to stop drinking soda and decrease caloric intake c. Body mass index is 42.44 kg/m.  3. Hypertension a. Hypotensive on arrival and home antihypertensives held b. BP currently stable c. Add on IV Lopressor now, holding home lisinopril-HCTZ in setting of AKI on admission pending repeat BMP 4. AKI a. No labs from today b. Hold lisinopril-HCTZ c. Follow-up BMP in  a.m. 5. Gross hematuria a. UA unremarkable b. Most likely not hematuria and actually mistaken GI bleed for hematuria c. monitor     Harold Hedge, DO [facility] Pager [phone]

## 2019-08-10 NOTE — Transfer of Care (Signed)
Immediate Anesthesia Transfer of Care Note  Patient: Terre Zabriskie Bains  Procedure(s) Performed: ESOPHAGOGASTRODUODENOSCOPY (EGD) WITH PROPOFOL (N/A ) SCLEROTHERAPY HOT HEMOSTASIS (ARGON PLASMA COAGULATION/BICAP) (N/A ) HEMOSTASIS CONTROL  Patient Location: Endoscopy Unit  Anesthesia Type:MAC  Level of Consciousness: drowsy  Airway & Oxygen Therapy: Patient Spontanous Breathing and Patient connected to face mask oxygen  Post-op Assessment: Report given to RN and Post -op Vital signs reviewed and stable  Post vital signs: Reviewed and stable  Last Vitals:  Vitals Value Taken Time  BP    Temp    Pulse    Resp    SpO2      Last Pain:  Vitals:   08/10/19 1215  TempSrc: Oral  PainSc:          Complications: No complications documented.

## 2019-08-10 NOTE — H&P (View-Only) (Signed)
Referring Provider:  EDP Primary Care Physician:  [name], DO Primary Gastroenterologist:  Althia Forts  Reason for Consultation:  Melena and heme positive stool  HPI: Peter Camacho is a 59 y.o. male with PMH significant for hypertension, morbid obesity, and history of prostate cancer with radioactive seed placement.  He presented to the emergency department at [facility] on June 12 with complaints of diarrhea and gross hematuria.  He states that he was in his usual state of health until June 11 when he began noticing the symptoms.  He also reported dark tarry stools, generalized weakness, and lightheadedness upon standing.  He denies abdominal pain or nausea, but admits to one episode of bloody emesis June 11.  Upon arrival to the ED he was found to be tachycardic in the 120s and initial blood pressure was 83/54.  BUN 41, creatinine 1.43.  CBC notable for leukocytosis 19,400 and normocytic anemia with hemoglobin 8.3 g down to 13.9 g 10 months ago.  INR is normal.  Is started on PPI drip, transfused 2 units packed red cells (second unit transfusing now).  Just urinated in the urinal and specimen is not frankly bloody.  Never had any type of GI bleeding in the past.  Never had EGD or colonoscopy.  Occasional ibuprofen use, but not often.  No complaints heartburn/reflux or dysphagia.   Past Medical History:  Diagnosis Date  . At risk for sleep apnea    STOP-BANG= 5    SENT TO PCP 03-17-2014  . Hard of hearing   . History of seizure    HX menigitis x2  in middle and high school - residual seizure's on medications--  last seizure age 86  . Hypertension   . Personal history of meningitis    X2  MIDDLE AND HIGH SCHOOL--  residual seizure's on medication -- last seizure age 73  . Prostate cancer ([place]) MONITORED BY DR OTTELIN   stage T1c, Gleason 3+4, PSA 12.79, vol 38.5cc    Past Surgical History:  Procedure Laterality Date  . PROSTATE BIOPSY    . RADIOACTIVE  SEED IMPLANT N/A 03/20/2014   Procedure: RADIOACTIVE SEED IMPLANT;  Surgeon: Claybon Jabs, MD;  Location: [facility];  Service: Urology;  Laterality: N/A;    Prior to Admission medications   Medication Sig Start Date End Date Taking? Authorizing Provider  acetaminophen (TYLENOL) 500 MG tablet Take 500 mg by mouth every 6 (six) hours as needed for mild pain, moderate pain, fever or headache.   Yes [provider]  albuterol (VENTOLIN HFA) 108 (90 Base) MCG/ACT inhaler TAKE 2 PUFFS BY MOUTH EVERY 6 HOURS AS NEEDED FOR WHEEZE OR SHORTNESS OF BREATH Patient taking differently: Inhale 2 puffs into the lungs every 6 (six) hours as needed for wheezing or shortness of breath.  06/23/19  Yes Wendling, [name], DO  lisinopril-hydrochlorothiazide (ZESTORETIC) 20-25 MG tablet TAKE 1 TABLET BY MOUTH EVERY DAY Patient taking differently: Take 1 tablet by mouth daily.  03/21/19  Yes [name], DO  Multiple Vitamin (MULTIVITAMIN WITH MINERALS) TABS tablet Take 1 tablet by mouth daily.   Yes [provider]  acetaminophen (TYLENOL) 325 MG tablet Take 2 tablets (650 mg total) by mouth every 6 (six) hours as needed. Patient not taking: Reported on 08/10/2019 02/24/18   Caccavale, Sophia, PA-C  levocetirizine (XYZAL) 5 MG tablet Take 1 tablet (5 mg total) by mouth every evening. Patient not taking: Reported on 08/10/2019 10/29/18  [name], DO  silver sulfADIAZINE (SILVADENE) 1 % cream Apply 1 application topically daily. Patient not taking: Reported on 08/10/2019 05/07/19   [name], DO    Current Facility-Administered Medications  Medication Dose Route Frequency Provider Last Rate Last Admin  . 0.9 %  sodium chloride infusion (Manually program via Guardrails IV Fluids)   Intravenous Once Shanon Rosser, MD   Held at 08/10/19 0505  . 0.9 %  sodium chloride infusion   Intravenous Continuous Carlisle Cater, PA-C 125 mL/hr at 08/10/19 0400  Restarted at 08/10/19 0400  . fentaNYL (SUBLIMAZE) injection 25-50 mcg  25-50 mcg Intravenous Q2H PRN Opyd, Ilene Qua, MD   50 mcg at 08/10/19 0515  . pantoprazole (PROTONIX) 80 mg in sodium chloride 0.9 % 100 mL (0.8 mg/mL) infusion  8 mg/hr Intravenous Continuous Opyd, Ilene Qua, MD 10 mL/hr at 08/10/19 0257 8 mg/hr at 08/10/19 0257  . [START ON 08/13/2019] pantoprazole (PROTONIX) injection 40 mg  40 mg Intravenous Q12H Opyd, Ilene Qua, MD      . promethazine (PHENERGAN) injection 12.5 mg  12.5 mg Intravenous Q6H PRN Opyd, Ilene Qua, MD   12.5 mg at 08/10/19 6195   Current Outpatient Medications  Medication Sig Dispense Refill  . acetaminophen (TYLENOL) 500 MG tablet Take 500 mg by mouth every 6 (six) hours as needed for mild pain, moderate pain, fever or headache.    . albuterol (VENTOLIN HFA) 108 (90 Base) MCG/ACT inhaler TAKE 2 PUFFS BY MOUTH EVERY 6 HOURS AS NEEDED FOR WHEEZE OR SHORTNESS OF BREATH (Patient taking differently: Inhale 2 puffs into the lungs every 6 (six) hours as needed for wheezing or shortness of breath. ) 8 g 2  . lisinopril-hydrochlorothiazide (ZESTORETIC) 20-25 MG tablet TAKE 1 TABLET BY MOUTH EVERY DAY (Patient taking differently: Take 1 tablet by mouth daily. ) 90 tablet 1  . Multiple Vitamin (MULTIVITAMIN WITH MINERALS) TABS tablet Take 1 tablet by mouth daily.    Marland Kitchen acetaminophen (TYLENOL) 325 MG tablet Take 2 tablets (650 mg total) by mouth every 6 (six) hours as needed. (Patient not taking: Reported on 08/10/2019) 30 tablet 0  . levocetirizine (XYZAL) 5 MG tablet Take 1 tablet (5 mg total) by mouth every evening. (Patient not taking: Reported on 08/10/2019) 30 tablet 2  . silver sulfADIAZINE (SILVADENE) 1 % cream Apply 1 application topically daily. (Patient not taking: Reported on 08/10/2019) 50 g 0    Allergies as of 08/09/2019 - Review Complete 08/09/2019  Allergen Reaction Noted  . Fish-derived products Rash 01/12/2014    Family History  Problem Relation Age of  Onset  . CAD Mother 11  . Diabetes Mother   . CAD Father 3  . Cancer Neg Hx     Social History   Socioeconomic History  . Marital status: Single    Spouse name: Not on file  . Number of children: Not on file  . Years of education: Not on file  . Highest education level: Not on file  Occupational History  . Occupation: metal heat treating  Tobacco Use  . Smoking status: Former Smoker    Packs/day: 0.30    Years: 40.00    Pack years: 12.00    Types: Cigarettes  . Smokeless tobacco: Never Used  . Tobacco comment: quit 11/11/16  Substance and Sexual Activity  . Alcohol use: No  . Drug use: No  . Sexual activity: Not on file  Other Topics Concern  . Not on file  Social History Narrative  .  Not on file   Social Determinants of Health   Financial Resource Strain:   . Difficulty of Paying Living Expenses:   Food Insecurity:   . Worried About Charity fundraiser in the Last Year:   . Arboriculturist in the Last Year:   Transportation Needs:   . Film/video editor (Medical):   Marland Kitchen Lack of Transportation (Non-Medical):   Physical Activity:   . Days of Exercise per Week:   . Minutes of Exercise per Session:   Stress:   . Feeling of Stress :   Social Connections:   . Frequency of Communication with Friends and Family:   . Frequency of Social Gatherings with Friends and Family:   . Attends Religious Services:   . Active Member of Clubs or Organizations:   . Attends Archivist Meetings:   Marland Kitchen Marital Status:   Intimate Partner Violence:   . Fear of Current or Ex-Partner:   . Emotionally Abused:   Marland Kitchen Physically Abused:   . Sexually Abused:     Review of Systems: ROS is O/W negative except as mentioned in HPI.  Physical Exam: Vital signs in last 24 hours: Temp:  [97.3 F (36.3 C)-98.8 F (37.1 C)] 98 F (36.7 C) (06/13 0745) Pulse Rate:  [100-158] 103 (06/13 0849) Resp:  [0-27] 14 (06/13 0849) BP: (83-154)/(53-132) 115/63 (06/13 0849) SpO2:  [93  %-100 %] 96 % (06/13 0849) Weight:  [152 kg] 152 kg (06/12 2205)   General:  Alert, Well-developed, well-nourished, pleasant and cooperative in NAD Head:  Normocephalic and atraumatic. Eyes:  Sclera clear, no icterus.  Conjunctiva pink. Ears:  HOH. Mouth:  No deformity or lesions.   Lungs:  Clear throughout to auscultation.  No wheezes, crackles, or rhonchi.  Heart:  Regular rate and rhythm; no murmurs, clicks, rubs, or gallops. Abdomen:  Soft, non-distended.  BS present.  Non-tender.   Rectal:  Deferred.  Hemoccult positive by EDP.  Msk:  Symmetrical without gross deformities. Pulses:  Normal pulses noted. Extremities:  Without clubbing or edema. Neurologic:  Alert and oriented x 4;  grossly normal neurologically. Skin:  Intact without significant lesions or rashes. Psych:  Alert and cooperative. Normal mood and affect.  Intake/Output from previous day: 06/12 0701 - 06/13 0700 In: 2630 [Blood:630; IV Piggyback:2000] Out: -   Lab Results: Recent Labs    08/09/19 2216  WBC 19.4*  HGB 8.3*  HCT 25.3*  PLT 233   BMET Recent Labs    08/09/19 2216  NA 137  K 3.6  CL 101  CO2 24  GLUCOSE 126*  BUN 41*  CREATININE 1.43*  CALCIUM 8.1*   LFT Recent Labs    08/09/19 2216  PROT 6.4*  ALBUMIN 3.2*  AST 16  ALT 14  ALKPHOS 67  BILITOT 0.3   PT/INR Recent Labs    08/09/19 2216  LABPROT 13.8  INR 1.1   IMPRESSION:  *Melena and heme positive stool:  Likely UGI bleed.  ? Ulcer vs other. *ABLA:  Hgb 8.3 grams down from 13.9 grams 10 months ago.  Has received 2 units of packed red blood cells. *Acute kidney injury *HOH:  Has a hard time hearing even with hearing aids.  PLAN: -Monitor Hgb and transfuse further prn. -Continue PPI drip for now. -EGD later today.   Laban Emperor. Zehr  08/10/2019, 8:55 AM    Attending Physician Note   I have taken a history, examined the patient and reviewed the chart. I  agree with the Advanced Practitioner's note, impression and  recommendations.  Acute GI bleed with melena, highly likely UGI source. R/O ulcer, AVM, other. ABL anemia AKI  Pantoprazole infusion IV volume resussitation  Trend CBC, transfusions to maintain Hgb >7 EGD today  Lucio Manuelito, MD [facility]

## 2019-08-10 NOTE — ED Notes (Addendum)
Patient became hot and diaphoretic. Ice pack and cold rag given, MD made aware.

## 2019-08-10 NOTE — ED Notes (Signed)
Urine sample contaminated by trash. Will need another sample.

## 2019-08-10 NOTE — Anesthesia Preprocedure Evaluation (Addendum)
Anesthesia Evaluation  Patient identified by MRN, date of birth, ID band Patient awake    Reviewed: Allergy & Precautions, NPO status , Patient's Chart, lab work & pertinent test results  Airway Mallampati: II  TM Distance: >3 FB Neck ROM: Full    Dental  (+) Teeth Intact, Dental Advisory Given   Pulmonary neg pulmonary ROS, former smoker,    Pulmonary exam normal breath sounds clear to auscultation       Cardiovascular hypertension, Pt. on medications  Rhythm:Regular Rate:Tachycardia     Neuro/Psych Seizures -, Well Controlled,   Neuromuscular disease    GI/Hepatic Neg liver ROS, Black, heme positive stools   Endo/Other  Morbid obesity  Renal/GU Renal disease (AKI)   Prostate cancer     Musculoskeletal negative musculoskeletal ROS (+)   Abdominal   Peds  Hematology  (+) Blood dyscrasia, anemia ,   Anesthesia Other Findings Day of surgery medications reviewed with the patient.  Reproductive/Obstetrics                            Anesthesia Physical Anesthesia Plan  ASA: III  Anesthesia Plan: MAC   Post-op Pain Management:    Induction: Intravenous  PONV Risk Score and Plan: 1 and Propofol infusion and Treatment may vary due to age or medical condition  Airway Management Planned: Natural Airway and Nasal Cannula  Additional Equipment:   Intra-op Plan:   Post-operative Plan:   Informed Consent: I have reviewed the patients History and Physical, chart, labs and discussed the procedure including the risks, benefits and alternatives for the proposed anesthesia with the patient or authorized representative who has indicated his/her understanding and acceptance.     Dental advisory given  Plan Discussed with: CRNA  Anesthesia Plan Comments:         Anesthesia Quick Evaluation

## 2019-08-10 NOTE — Interval H&P Note (Signed)
History and Physical Interval Note:  08/10/2019 11:55 AM  Peter Camacho  has presented today for surgery, with the diagnosis of Black, heme positive stools, anemia.  The various methods of treatment have been discussed with the patient and family. After consideration of risks, benefits and other options for treatment, the patient has consented to  Procedure(s): ESOPHAGOGASTRODUODENOSCOPY (EGD) WITH PROPOFOL (N/A) as a surgical intervention.  The patient's history has been reviewed, patient examined, no change in status, stable for surgery.  I have reviewed the patient's chart and labs.  Questions were answered to the patient's satisfaction.     Pricilla Riffle. Fuller Plan

## 2019-08-10 NOTE — Op Note (Signed)
[facility] Patient Name: Peter Camacho Procedure Date: 08/10/2019 MRN: [id number] Attending MD: Ladene Artist , MD Date of Birth: [birth year] CSN: [id number] Age: 59 Admit Type: Inpatient Procedure:                Upper GI endoscopy Indications:              Melena, Suspected upper gastrointestinal bleeding Providers:                Pricilla Riffle. Fuller Plan, MD, Glori Bickers, RN, Elspeth Cho Tech., Technician, Danley Danker, CRNA Referring MD:             [facility] Medicines:                Monitored Anesthesia Care Complications:            No immediate complications. Estimated Blood Loss:     Estimated blood loss was minimal. Procedure:                Pre-Anesthesia Assessment:                           - Prior to the procedure, a History and Physical                            was performed, and patient medications and                            allergies were reviewed. The patient's tolerance of                            previous anesthesia was also reviewed. The risks                            and benefits of the procedure and the sedation                            options and risks were discussed with the patient.                            All questions were answered, and informed consent                            was obtained. Prior Anticoagulants: The patient has                            taken no previous anticoagulant or antiplatelet                            agents. ASA Grade Assessment: III - A patient with                            severe systemic disease. After reviewing the risks  and benefits, the patient was deemed in                            satisfactory condition to undergo the procedure.                           After obtaining informed consent, the endoscope was                            passed under direct vision. Throughout the                            procedure, the patient's blood  pressure, pulse, and                            oxygen saturations were monitored continuously. The                            GIF-H190 ([id number]) Olympus gastroscope was                            introduced through the mouth, and advanced to the                            second part of duodenum. The upper GI endoscopy was                            accomplished without difficulty. The patient                            tolerated the procedure well. Scope In: Scope Out: Findings:      Localized mild erythema was found in the distal esophagus.      The exam of the esophagus was otherwise normal.      Patchy mild inflammation characterized by erythema was found in the       prepyloric region of the stomach.      The exam of the stomach was otherwise normal.      One non-bleeding cratered duodenal ulcer with a clot that was easily       removed by lavage revealing a visible vessel was found in the duodenal       bulb. The lesion was 8 mm in largest dimension. Area was successfully       injected with 4 mL of a 1:10,000 solution of epinephrine to prevent       rebleeding. Vaporization for bleeding prevention using bipolar probe was       successful. The visible vessel was ablated, flattened. No bleeding noted       at end of procedure.      One non-bleeding superficial duodenal ulcer with no stigmata of bleeding       was found in the duodenal bulb. The lesion was 5 mm in largest dimension.      The exam of the duodenum was otherwise normal. Impression:               - Erythema in the distal esophagus.                           -  Gastritis.                           - Non-bleeding duodenal ulcer with a clot and                            underlying visible vessel. Injected. Treated with                            bipolar cautery.                           - Non-bleeding duodenal ulcer with no stigmata of                            bleeding.                           - No specimens  collected. Moderate Sedation:      Not Applicable - Patient had care per Anesthesia. Recommendation:           - Return patient to hospital ward for ongoing care.                           - Clear liquid diet today.                           - Continue present medications.                           - No aspirin, ibuprofen, naproxen, or other                            non-steroidal anti-inflammatory drugs.                           - Continue pantropazole infusion for 72 hours.                           - H pylori Ab.                           - Trend CBC. Procedure Code(s):        --- Professional ---                           [phone], Esophagogastroduodenoscopy, flexible,                            transoral; with control of bleeding, any method Diagnosis Code(s):        --- Professional ---                           K22.8, Other specified diseases of esophagus                           K29.70, Gastritis, unspecified, without bleeding  K26.4, Chronic or unspecified duodenal ulcer with                            hemorrhage                           K26.9, Duodenal ulcer, unspecified as acute or                            chronic, without hemorrhage or perforation                           K92.1, Melena (includes Hematochezia) CPT copyright 2019 American [employer] Association. All rights reserved. The codes documented in this report are preliminary and upon coder review may  be revised to meet current compliance requirements. Ladene Artist, MD 08/10/2019 12:49:41 PM This report has been signed electronically. Number of Addenda: 0

## 2019-08-10 NOTE — Consult Note (Addendum)
Referring Provider:  EDP Primary Care Physician:  [name], DO Primary Gastroenterologist:  Althia Forts  Reason for Consultation:  Melena and heme positive stool  HPI: Peter Camacho is a 59 y.o. male with PMH significant for hypertension, morbid obesity, and history of prostate cancer with radioactive seed placement.  He presented to the emergency department at [facility] on June 12 with complaints of diarrhea and gross hematuria.  He states that he was in his usual state of health until June 11 when he began noticing the symptoms.  He also reported dark tarry stools, generalized weakness, and lightheadedness upon standing.  He denies abdominal pain or nausea, but admits to one episode of bloody emesis June 11.  Upon arrival to the ED he was found to be tachycardic in the 120s and initial blood pressure was 83/54.  BUN 41, creatinine 1.43.  CBC notable for leukocytosis 19,400 and normocytic anemia with hemoglobin 8.3 g down to 13.9 g 10 months ago.  INR is normal.  Is started on PPI drip, transfused 2 units packed red cells (second unit transfusing now).  Just urinated in the urinal and specimen is not frankly bloody.  Never had any type of GI bleeding in the past.  Never had EGD or colonoscopy.  Occasional ibuprofen use, but not often.  No complaints heartburn/reflux or dysphagia.   Past Medical History:  Diagnosis Date   At risk for sleep apnea    STOP-BANG= 5    SENT TO PCP 03-17-2014   Hard of hearing    History of seizure    HX menigitis x2  in middle and high school - residual seizure's on medications--  last seizure age 46   Hypertension    Personal history of meningitis    X2  MIDDLE AND HIGH SCHOOL--  residual seizure's on medication -- last seizure age 67   Prostate cancer ([place]) MONITORED BY DR OTTELIN   stage T1c, Gleason 3+4, PSA 12.79, vol 38.5cc    Past Surgical History:  Procedure Laterality Date   PROSTATE BIOPSY     RADIOACTIVE  SEED IMPLANT N/A 03/20/2014   Procedure: RADIOACTIVE SEED IMPLANT;  Surgeon: Claybon Jabs, MD;  Location: [place];  Service: Urology;  Laterality: N/A;    Prior to Admission medications   Medication Sig Start Date End Date Taking? Authorizing Provider  acetaminophen (TYLENOL) 500 MG tablet Take 500 mg by mouth every 6 (six) hours as needed for mild pain, moderate pain, fever or headache.   Yes [provider]  albuterol (VENTOLIN HFA) 108 (90 Base) MCG/ACT inhaler TAKE 2 PUFFS BY MOUTH EVERY 6 HOURS AS NEEDED FOR WHEEZE OR SHORTNESS OF BREATH Patient taking differently: Inhale 2 puffs into the lungs every 6 (six) hours as needed for wheezing or shortness of breath.  06/23/19  Yes Wendling, [name], DO  lisinopril-hydrochlorothiazide (ZESTORETIC) 20-25 MG tablet TAKE 1 TABLET BY MOUTH EVERY DAY Patient taking differently: Take 1 tablet by mouth daily.  03/21/19  Yes [name], DO  Multiple Vitamin (MULTIVITAMIN WITH MINERALS) TABS tablet Take 1 tablet by mouth daily.   Yes [provider]  acetaminophen (TYLENOL) 325 MG tablet Take 2 tablets (650 mg total) by mouth every 6 (six) hours as needed. Patient not taking: Reported on 08/10/2019 02/24/18   Caccavale, Sophia, PA-C  levocetirizine (XYZAL) 5 MG tablet Take 1 tablet (5 mg total) by mouth every evening. Patient not taking: Reported on 08/10/2019 10/29/18  [name], DO  silver sulfADIAZINE (SILVADENE) 1 % cream Apply 1 application topically daily. Patient not taking: Reported on 08/10/2019 05/07/19   [name], DO    Current Facility-Administered Medications  Medication Dose Route Frequency Provider Last Rate Last Admin   0.9 %  sodium chloride infusion (Manually program via Guardrails IV Fluids)   Intravenous Once Molpus, John, MD   Held at 08/10/19 0505   0.9 %  sodium chloride infusion   Intravenous Continuous Carlisle Cater, PA-C 125 mL/hr at 08/10/19 0400  Restarted at 08/10/19 0400   fentaNYL (SUBLIMAZE) injection 25-50 mcg  25-50 mcg Intravenous Q2H PRN Vianne Bulls, MD   50 mcg at 08/10/19 0515   pantoprazole (PROTONIX) 80 mg in sodium chloride 0.9 % 100 mL (0.8 mg/mL) infusion  8 mg/hr Intravenous Continuous Opyd, Ilene Qua, MD 10 mL/hr at 08/10/19 0257 8 mg/hr at 08/10/19 0257   [START ON 08/13/2019] pantoprazole (PROTONIX) injection 40 mg  40 mg Intravenous Q12H Opyd, Ilene Qua, MD       promethazine (PHENERGAN) injection 12.5 mg  12.5 mg Intravenous Q6H PRN Opyd, Ilene Qua, MD   12.5 mg at 08/10/19 6644   Current Outpatient Medications  Medication Sig Dispense Refill   acetaminophen (TYLENOL) 500 MG tablet Take 500 mg by mouth every 6 (six) hours as needed for mild pain, moderate pain, fever or headache.     albuterol (VENTOLIN HFA) 108 (90 Base) MCG/ACT inhaler TAKE 2 PUFFS BY MOUTH EVERY 6 HOURS AS NEEDED FOR WHEEZE OR SHORTNESS OF BREATH (Patient taking differently: Inhale 2 puffs into the lungs every 6 (six) hours as needed for wheezing or shortness of breath. ) 8 g 2   lisinopril-hydrochlorothiazide (ZESTORETIC) 20-25 MG tablet TAKE 1 TABLET BY MOUTH EVERY DAY (Patient taking differently: Take 1 tablet by mouth daily. ) 90 tablet 1   Multiple Vitamin (MULTIVITAMIN WITH MINERALS) TABS tablet Take 1 tablet by mouth daily.     acetaminophen (TYLENOL) 325 MG tablet Take 2 tablets (650 mg total) by mouth every 6 (six) hours as needed. (Patient not taking: Reported on 08/10/2019) 30 tablet 0   levocetirizine (XYZAL) 5 MG tablet Take 1 tablet (5 mg total) by mouth every evening. (Patient not taking: Reported on 08/10/2019) 30 tablet 2   silver sulfADIAZINE (SILVADENE) 1 % cream Apply 1 application topically daily. (Patient not taking: Reported on 08/10/2019) 50 g 0    Allergies as of 08/09/2019 - Review Complete 08/09/2019  Allergen Reaction Noted   Fish-derived products Rash 01/12/2014    Family History  Problem Relation Age of  Onset   CAD Mother 78   Diabetes Mother    CAD Father 69   Cancer Neg Hx     Social History   Socioeconomic History   Marital status: Single    Spouse name: Not on file   Number of children: Not on file   Years of education: Not on file   Highest education level: Not on file  Occupational History   Occupation: metal heat treating  Tobacco Use   Smoking status: Former Smoker    Packs/day: 0.30    Years: 40.00    Pack years: 12.00    Types: Cigarettes   Smokeless tobacco: Never Used   Tobacco comment: quit 11/11/16  Substance and Sexual Activity   Alcohol use: No   Drug use: No   Sexual activity: Not on file  Other Topics Concern   Not on file  Social History Narrative  Not on file   Social Determinants of Health   Financial Resource Strain:    Difficulty of Paying Living Expenses:   Food Insecurity:    Worried About Charity fundraiser in the Last Year:    Arboriculturist in the Last Year:   Transportation Needs:    Film/video editor (Medical):    Lack of Transportation (Non-Medical):   Physical Activity:    Days of Exercise per Week:    Minutes of Exercise per Session:   Stress:    Feeling of Stress :   Social Connections:    Frequency of Communication with Friends and Family:    Frequency of Social Gatherings with Friends and Family:    Attends Religious Services:    Active Member of Clubs or Organizations:    Attends Music therapist:    Marital Status:   Intimate Partner Violence:    Fear of Current or Ex-Partner:    Emotionally Abused:    Physically Abused:    Sexually Abused:     Review of Systems: ROS is O/W negative except as mentioned in HPI.  Physical Exam: Vital signs in last 24 hours: Temp:  [97.3 F (36.3 C)-98.8 F (37.1 C)] 98 F (36.7 C) (06/13 0745) Pulse Rate:  [100-158] 103 (06/13 0849) Resp:  [0-27] 14 (06/13 0849) BP: (83-154)/(53-132) 115/63 (06/13 0849) SpO2:  [93  %-100 %] 96 % (06/13 0849) Weight:  [152 kg] 152 kg (06/12 2205)   General:  Alert, Well-developed, well-nourished, pleasant and cooperative in NAD Head:  Normocephalic and atraumatic. Eyes:  Sclera clear, no icterus.  Conjunctiva pink. Ears:  HOH. Mouth:  No deformity or lesions.   Lungs:  Clear throughout to auscultation.  No wheezes, crackles, or rhonchi.  Heart:  Regular rate and rhythm; no murmurs, clicks, rubs, or gallops. Abdomen:  Soft, non-distended.  BS present.  Non-tender.   Rectal:  Deferred.  Hemoccult positive by EDP.  Msk:  Symmetrical without gross deformities. Pulses:  Normal pulses noted. Extremities:  Without clubbing or edema. Neurologic:  Alert and oriented x 4;  grossly normal neurologically. Skin:  Intact without significant lesions or rashes. Psych:  Alert and cooperative. Normal mood and affect.  Intake/Output from previous day: 06/12 0701 - 06/13 0700 In: 2630 [Blood:630; IV Piggyback:2000] Out: -   Lab Results: Recent Labs    08/09/19 2216  WBC 19.4*  HGB 8.3*  HCT 25.3*  PLT 233   BMET Recent Labs    08/09/19 2216  NA 137  K 3.6  CL 101  CO2 24  GLUCOSE 126*  BUN 41*  CREATININE 1.43*  CALCIUM 8.1*   LFT Recent Labs    08/09/19 2216  PROT 6.4*  ALBUMIN 3.2*  AST 16  ALT 14  ALKPHOS 67  BILITOT 0.3   PT/INR Recent Labs    08/09/19 2216  LABPROT 13.8  INR 1.1   IMPRESSION:  *Melena and heme positive stool:  Likely UGI bleed.  ? Ulcer vs other. *ABLA:  Hgb 8.3 grams down from 13.9 grams 10 months ago.  Has received 2 units of packed red blood cells. *Acute kidney injury *HOH:  Has a hard time hearing even with hearing aids.  PLAN: -Monitor Hgb and transfuse further prn. -Continue PPI drip for now. -EGD later today.   Laban Emperor. Zehr  08/10/2019, 8:55 AM    Attending Physician Note   I have taken a history, examined the patient and reviewed the chart. I  agree with the Advanced Practitioner's note, impression and  recommendations.  Acute GI bleed with melena, highly likely UGI source. R/O ulcer, AVM, other. ABL anemia AKI  Pantoprazole infusion IV volume resussitation  Trend CBC, transfusions to maintain Hgb >7 EGD today  Lucio Rojelio, MD [facility] Gastroenter[facility]

## 2019-08-10 NOTE — ED Notes (Signed)
Admitting physician at bedside

## 2019-08-10 NOTE — Anesthesia Postprocedure Evaluation (Signed)
Anesthesia Post Note  Patient: Drayson Dorko Veselka  Procedure(s) Performed: ESOPHAGOGASTRODUODENOSCOPY (EGD) WITH PROPOFOL (N/A ) SCLEROTHERAPY HOT HEMOSTASIS (ARGON PLASMA COAGULATION/BICAP) (N/A ) HEMOSTASIS CONTROL     Patient location during evaluation: Endoscopy Anesthesia Type: MAC Level of consciousness: awake and alert Pain management: pain level controlled Vital Signs Assessment: post-procedure vital signs reviewed and stable Respiratory status: spontaneous breathing, nonlabored ventilation, respiratory function stable and patient connected to nasal cannula oxygen Cardiovascular status: stable and blood pressure returned to baseline Postop Assessment: no apparent nausea or vomiting Anesthetic complications: no   No complications documented.  Last Vitals:  Vitals:   08/10/19 1501 08/10/19 1736  BP: 116/71 118/71  Pulse: (!) 104 (!) 110  Resp: (!) 21 16  Temp:    SpO2: 98% 95%    Last Pain:  Vitals:   08/10/19 1259  TempSrc:   PainSc: 4                  Catalina Gravel

## 2019-08-10 NOTE — Plan of Care (Signed)
  Problem: Education: Goal: Ability to identify signs and symptoms of gastrointestinal bleeding will improve Outcome: Progressing   Problem: Bowel/Gastric: Goal: Will show no signs and symptoms of gastrointestinal bleeding Outcome: Progressing   Problem: Fluid Volume: Goal: Will show no signs and symptoms of excessive bleeding Outcome: Progressing   

## 2019-08-10 NOTE — ED Notes (Signed)
Charge RN at bedside attempting to place another IV.

## 2019-08-10 NOTE — H&P (Addendum)
History and Physical    JEN EPPINGER FTD:[id number] DOB: November 03, 1960 DOA: 08/09/2019  PCP: [name], DO   Patient coming from: Home   Chief Complaint: Tarry stool, generalized weakness, hematuria  HPI: Peter Camacho is a 59 y.o. male with medical history significant for hypertension and BMI 42, now presenting to the emergency department for evaluation of generalized weakness, hematuria, and tarry stool.  Patient reports that he been in his usual state of health until 08/08/2019 when he noticed gross hematuria and diarrhea.  Since then, he has had dark tarry stool, generalized weakness, and lightheadedness upon standing.  He denies any abdominal pain and denies nausea now but states that he did have one episode of nonbloody vomiting yesterday.  He denies any alcohol use, any recent NSAID use, or any history of GI bleeding.  He has never had endoscopy.  He does not use aspirin or anticoagulant.  He denies any chest pain.  Denies any recent fevers or chills.  [facility] ED Course: Upon arrival to the ED, patient is found to be afebrile, saturating 90s on room air, tachycardic to the 120s, and with initial blood pressure 83/54.  EKG features sinus tachycardia with rate 121.  Chemistry panel is notable for BUN of 41 and creatinine 1.43, up from 0.88 last August.  CBC is notable for leukocytosis to 19,400 and a normocytic anemia with hemoglobin 8.3, down from 13.9 last August.  Lactic acid is slightly elevated at 2.0 and INR is normal.  Patient was given a liter of normal saline and 80 mg IV Protonix in the ED.  He was transferred to [facility] for ongoing evaluation and management.  Review of Systems:  All other systems reviewed and apart from HPI, are negative.  Past Medical History:  Diagnosis Date  . At risk for sleep apnea    STOP-BANG= 5    SENT TO PCP 03-17-2014  . Hard of hearing   . History of seizure    HX menigitis x2  in middle and high school -  residual seizure's on medications--  last seizure age 26  . Hypertension   . Personal history of meningitis    X2  MIDDLE AND HIGH SCHOOL--  residual seizure's on medication -- last seizure age 82  . Prostate cancer ([place]) MONITORED BY DR OTTELIN   stage T1c, Gleason 3+4, PSA 12.79, vol 38.5cc    Past Surgical History:  Procedure Laterality Date  . PROSTATE BIOPSY    . RADIOACTIVE SEED IMPLANT N/A 03/20/2014   Procedure: RADIOACTIVE SEED IMPLANT;  Surgeon: Claybon Jabs, MD;  Location: [facility];  Service: Urology;  Laterality: N/A;     reports that he has quit smoking. His smoking use included cigarettes. He has a 12.00 pack-year smoking history. He has never used smokeless tobacco. He reports that he does not drink alcohol and does not use drugs.  Allergies  Allergen Reactions  . Fish-Derived Products Rash    Family History  Problem Relation Age of Onset  . CAD Mother 61  . Diabetes Mother   . CAD Father 29  . Cancer Neg Hx      Prior to Admission medications   Medication Sig Start Date End Date Taking? Authorizing Provider  acetaminophen (TYLENOL) 325 MG tablet Take 2 tablets (650 mg total) by mouth every 6 (six) hours as needed. 02/24/18   Caccavale, Sophia, PA-C  albuterol (VENTOLIN HFA) 108 (90 Base) MCG/ACT inhaler TAKE 2 PUFFS  BY MOUTH EVERY 6 HOURS AS NEEDED FOR WHEEZE OR SHORTNESS OF BREATH 06/23/19   Wendling, [name], DO  levocetirizine (XYZAL) 5 MG tablet Take 1 tablet (5 mg total) by mouth every evening. 10/29/18   [name], DO  lisinopril-hydrochlorothiazide (ZESTORETIC) 20-25 MG tablet TAKE 1 TABLET BY MOUTH EVERY DAY 03/21/19   Wendling, [name], DO  silver sulfADIAZINE (SILVADENE) 1 % cream Apply 1 application topically daily. 05/07/19   [name], DO    Physical Exam: Vitals:   08/10/19 0118 08/10/19 0130 08/10/19 0227 08/10/19 0232  BP:  (!) 119/105 (!) 88/63 (!) 92/53  Pulse:  (!) 101 (!) 123 (!) 119    Resp: (!) 22  15 18   Temp:   (!) 97.3 F (36.3 C)   TempSrc:      SpO2:  98% 100% 98%  Weight:        Constitutional: NAD, calm  Eyes: PERTLA, lids and conjunctivae normal ENMT: Mucous membranes are moist. Posterior pharynx clear of any exudate or lesions.   Neck: normal, supple, no masses, no thyromegaly Respiratory: clear to auscultation bilaterally, no wheezing, no crackles. No accessory muscle use.  Cardiovascular: Rate ~110 and regular. No extremity edema.   Abdomen: No distension, no tenderness, soft. Bowel sounds active.  Musculoskeletal: no clubbing / cyanosis. No joint deformity upper and lower extremities.   Skin: no significant rashes, lesions, ulcers. Warm, dry, well-perfused. Neurologic: No facial asymmetry. Gross hearing loss. Sensation intact. Moving all extremities.  Psychiatric: Alert and oriented to person, place, and situation. Pleasant and cooperative.    Labs and Imaging on Admission: I have personally reviewed following labs and imaging studies  CBC: Recent Labs  Lab 08/09/19 2216  WBC 19.4*  HGB 8.3*  HCT 25.3*  MCV 92.7  PLT 588   Basic Metabolic Panel: Recent Labs  Lab 08/09/19 2216  NA 137  K 3.6  CL 101  CO2 24  GLUCOSE 126*  BUN 41*  CREATININE 1.43*  CALCIUM 8.1*   GFR: Estimated Creatinine Clearance: 87.2 mL/min (A) (by C-G formula based on SCr of 1.43 mg/dL (H)). Liver Function Tests: Recent Labs  Lab 08/09/19 2216  AST 16  ALT 14  ALKPHOS 67  BILITOT 0.3  PROT 6.4*  ALBUMIN 3.2*   No results for input(s): LIPASE, AMYLASE in the last 168 hours. No results for input(s): AMMONIA in the last 168 hours. Coagulation Profile: Recent Labs  Lab 08/09/19 2216  INR 1.1   Cardiac Enzymes: No results for input(s): CKTOTAL, CKMB, CKMBINDEX, TROPONINI in the last 168 hours. BNP (last 3 results) No results for input(s): PROBNP in the last 8760 hours. HbA1C: No results for input(s): HGBA1C in the last 72 hours. CBG: Recent  Labs  Lab 08/09/19 2209  GLUCAP 118*   Lipid Profile: No results for input(s): CHOL, HDL, LDLCALC, TRIG, CHOLHDL, LDLDIRECT in the last 72 hours. Thyroid Function Tests: No results for input(s): TSH, T4TOTAL, FREET4, T3FREE, THYROIDAB in the last 72 hours. Anemia Panel: No results for input(s): VITAMINB12, FOLATE, FERRITIN, TIBC, IRON, RETICCTPCT in the last 72 hours. Urine analysis:    Component Value Date/Time   COLORURINE AMBER (A) 11/29/2016 1215   APPEARANCEUR CLEAR 11/29/2016 1215   LABSPEC 1.020 11/29/2016 1215   PHURINE 6.0 11/29/2016 1215   GLUCOSEU NEGATIVE 11/29/2016 1215   HGBUR SMALL (A) 11/29/2016 1215   BILIRUBINUR SMALL (A) 11/29/2016 1215   KETONESUR 15 (A) 11/29/2016 1215   PROTEINUR 100 (A) 11/29/2016 1215  UROBILINOGEN 0.2 11/17/2012 1356   NITRITE NEGATIVE 11/29/2016 1215   LEUKOCYTESUR NEGATIVE 11/29/2016 1215   Sepsis Labs: @LABRCNTIP (procalcitonin:4,lacticidven:4) ) Recent Results (from the past 240 hour(s))  SARS Coronavirus 2 by RT PCR (hospital order, performed in [facility] hospital lab) Nasopharyngeal Nasopharyngeal Swab     Status: None   Collection Time: 08/09/19 11:44 PM   Specimen: Nasopharyngeal Swab  Result Value Ref Range Status   SARS Coronavirus 2 NEGATIVE NEGATIVE Final    Comment: (NOTE) SARS-CoV-2 target nucleic acids are NOT DETECTED.  The SARS-CoV-2 RNA is generally detectable in upper and lower respiratory specimens during the acute phase of infection. The lowest concentration of SARS-CoV-2 viral copies this assay can detect is 250 copies / mL. A negative result does not preclude SARS-CoV-2 infection and should not be used as the sole basis for treatment or other patient management decisions.  A negative result may occur with improper specimen collection / handling, submission of specimen other than nasopharyngeal swab, presence of viral mutation(s) within the areas targeted by this assay, and inadequate number of viral  copies (<250 copies / mL). A negative result must be combined with clinical observations, patient history, and epidemiological information.  Fact Sheet for Patients:   StrictlyIdeas.no  Fact Sheet for Healthcare Providers: BankingDealers.co.za  This test is not yet approved or  cleared by the [place] FDA and has been authorized for detection and/or diagnosis of SARS-CoV-2 by FDA under an Emergency Use Authorization (EUA).  This EUA will remain in effect (meaning this test can be used) for the duration of the COVID-19 declaration under Section 564(b)(1) of the Act, 21 U.S.C. section 360bbb-3(b)(1), unless the authorization is terminated or revoked sooner.  Performed at [facility], [address] [place], [name] [address]      Radiological Exams on Admission: No results found.  EKG: Independently reviewed. Sinus tachycardia (rate 121).   Assessment/Plan   1. Acute GI bleeding; symptomatic anemia  - Presents with one day of melena with progressive generalized weakness and lightheadedness on standing, and is found to have SBP 83, HR 126, and Hgb 8.3 (13.9 in August 2020)  - He was given 1 liter NS and Protonix 80 mg IV in ED - He denies any hx of GIB, denies EtOH or NSAID use, and has never had endoscopy  - Upper source suspected in light of melena but no abdominal pain or hematemesis and no bleeding since arrival in hospital  - Continue IVF, continue IV PPI, check post-transfusion H&H, discuss with GI   ADDENDUM: Patient reassessed; less than 1/2 of the one liter NS bolus has infused so far, RBC not yet arrived, and SBP now 110 with HR down to 103. He is resting comfortably and there has not been any bleeding since arrival   2. Acute kidney injury  - SCr is 1.43 in ED, up from an apparent baseline of ~0.9  - Likely acute prerenal azotemia in setting of blood-loss and low BP  - Hold antihypertensives, continue  with IVF and blood transfusion, renally-dose medications, and repeat chem panel in am    3. Hypertension  - Patient was hypotensive on arrival to ED and his antihypertensives will be held initially    4. Gross hematuria  - Check UA    DVT prophylaxis: SCDs Code Status: Full  Family Communication: Discussed with patient  Disposition Plan:  Patient is from: Home  Anticipated d/c is to: Home  Anticipated d/c date is: 08/13/19 Patient currently:  Pending ongoing evaluation and mgmt of GI bleeding with symptomatic anemia  Consults called: Message sent to GI for AM consult request. AM consult felt to be appropriate as patient's BP has normalized with IVF and there has not been any bleeding noted since arrival.     Admission status: Inpatient     Vianne Bulls, MD [facility] Pager: See [email or web address]  If 7AM-7PM, please contact the daytime attending [email or web address]  08/10/2019, 2:48 AM

## 2019-08-10 NOTE — ED Provider Notes (Signed)
2:27 AM Patient sent from [facility] for symptomatic anemia.  He has had melena and the likely source is gastric.  He is to be admitted by the hospitalist service, discussed with Dr. Myna Hidalgo.  In the meantime we will order 2 units of packed red blood cells to initiate treatment.  The patient has pale conjunctive a but is in no acute distress.   Dianna Ewald, Jenny Reichmann, MD 08/10/19 [phone]

## 2019-08-10 NOTE — ED Notes (Signed)
[facility] notified [facility] Cerise) - patient ready for transport to [facility] ED

## 2019-08-11 ENCOUNTER — Encounter (HOSPITAL_COMMUNITY): Payer: Self-pay | Admitting: Gastroenterology

## 2019-08-11 DIAGNOSIS — K264 Chronic or unspecified duodenal ulcer with hemorrhage: Principal | ICD-10-CM

## 2019-08-11 DIAGNOSIS — D500 Iron deficiency anemia secondary to blood loss (chronic): Secondary | ICD-10-CM

## 2019-08-11 DIAGNOSIS — K921 Melena: Secondary | ICD-10-CM

## 2019-08-11 LAB — CBC
HCT: 22.6 % — ABNORMAL LOW (ref 39.0–52.0)
Hemoglobin: 7.4 g/dL — ABNORMAL LOW (ref 13.0–17.0)
MCH: 30.6 pg (ref 26.0–34.0)
MCHC: 32.7 g/dL (ref 30.0–36.0)
MCV: 93.4 fL (ref 80.0–100.0)
Platelets: 186 10*3/uL (ref 150–400)
RBC: 2.42 MIL/uL — ABNORMAL LOW (ref 4.22–5.81)
RDW: 15.8 % — ABNORMAL HIGH (ref 11.5–15.5)
WBC: 10.6 10*3/uL — ABNORMAL HIGH (ref 4.0–10.5)
nRBC: 0.3 % — ABNORMAL HIGH (ref 0.0–0.2)

## 2019-08-11 LAB — HEMOGLOBIN AND HEMATOCRIT, BLOOD
HCT: 23.1 % — ABNORMAL LOW (ref 39.0–52.0)
HCT: 22.5 % — ABNORMAL LOW (ref 39.0–52.0)
Hemoglobin: 7.5 g/dL — ABNORMAL LOW (ref 13.0–17.0)
Hemoglobin: 7.4 g/dL — ABNORMAL LOW (ref 13.0–17.0)

## 2019-08-11 LAB — TYPE AND SCREEN
ABO/RH(D): O POS
Antibody Screen: NEGATIVE
Unit division: 0
Unit division: 0

## 2019-08-11 LAB — LIPID PANEL
Cholesterol: 129 mg/dL (ref 0–200)
HDL: 47 mg/dL (ref >40)
LDL Cholesterol: 67 mg/dL (ref 0–99)
Total CHOL/HDL Ratio: 2.7 RATIO
Triglycerides: 77 mg/dL (ref <150)
VLDL: 15 mg/dL (ref 0–40)

## 2019-08-11 LAB — BPAM RBC
Blood Product Expiration Date: 202107152359
Blood Product Expiration Date: 202107152359
ISSUE DATE / TIME: 202106130435
ISSUE DATE / TIME: 202106130733
Unit Type and Rh: 5100
Unit Type and Rh: 5100

## 2019-08-11 LAB — HEMOGLOBIN A1C
Hgb A1c MFr Bld: 6 % — ABNORMAL HIGH (ref 4.8–5.6)
Mean Plasma Glucose: 125.5 mg/dL

## 2019-08-11 LAB — BASIC METABOLIC PANEL
Anion gap: 8 (ref 5–15)
BUN: 19 mg/dL (ref 6–20)
CO2: 27 mmol/L (ref 22–32)
Calcium: 8 mg/dL — ABNORMAL LOW (ref 8.9–10.3)
Chloride: 104 mmol/L (ref 98–111)
Creatinine, Ser: 0.95 mg/dL (ref 0.61–1.24)
GFR calc Af Amer: >60 mL/min (ref >60)
GFR calc non Af Amer: >60 mL/min (ref >60)
Glucose, Bld: 109 mg/dL — ABNORMAL HIGH (ref 70–99)
Potassium: 4 mmol/L (ref 3.5–5.1)
Sodium: 139 mmol/L (ref 135–145)

## 2019-08-11 LAB — MAGNESIUM: Magnesium: 2 mg/dL (ref 1.7–2.4)

## 2019-08-11 LAB — HIV ANTIBODY (ROUTINE TESTING W REFLEX): HIV Screen 4th Generation wRfx: NONREACTIVE

## 2019-08-11 MED ORDER — LISINOPRIL-HYDROCHLOROTHIAZIDE 20-25 MG PO TABS: 1.0000 | ORAL_TABLET | Freq: Every day | ORAL | Status: DC

## 2019-08-11 MED ORDER — METOPROLOL TARTRATE 5 MG/5ML IV SOLN: 5.0000 mg | Freq: Four times a day (QID) | INTRAVENOUS | Status: DC | PRN

## 2019-08-11 MED ORDER — LISINOPRIL 20 MG PO TABS
20.0000 mg | ORAL_TABLET | Freq: Every day | ORAL | Status: DC
  Administered 2019-08-11 – 2019-08-13 (×3): 20 mg via ORAL
  Filled 2019-08-11 (×3): qty 1

## 2019-08-11 MED ORDER — HYDROCHLOROTHIAZIDE 25 MG PO TABS
25.0000 mg | ORAL_TABLET | Freq: Every day | ORAL | Status: DC
  Administered 2019-08-11 – 2019-08-13 (×3): 25 mg via ORAL
  Filled 2019-08-11 (×3): qty 1

## 2019-08-11 NOTE — Evaluation (Signed)
Physical Therapy Evaluation Patient Details Name: Peter Camacho MRN: [id number] DOB: June 14, 1960 Today's Date: 08/11/2019   History of Present Illness  Pt is a 59 year old admitted for UGI bleed s/p EGD on 6/13 with PMHx signifnicant for meningitis, HOH, prostate cancer, seizure, sleep apnea  Clinical Impression  Patient evaluated by Physical Therapy with no further acute PT needs identified. All education has been completed and the patient has no further questions.  Pt ambulated good distance in hallway and denies any symptoms.  Pt encouraged to ambulate with family/staff especially if feeling dizzy.  Pt agreeable to no further PT needs at this time. See below for any follow-up Physical Therapy or equipment needs. PT is signing off. Thank you for this referral.     Follow Up Recommendations No PT follow up    Equipment Recommendations  None recommended by PT    Recommendations for Other Services       Precautions / Restrictions Precautions Precautions: Fall Restrictions Weight Bearing Restrictions: No      Mobility  Bed Mobility Overal bed mobility: Modified Independent                Transfers Overall transfer level: Modified independent                  Ambulation/Gait Ambulation/Gait assistance: Supervision Gait Distance (Feet): 480 Feet Assistive device: None Gait Pattern/deviations: WFL(Within Functional Limits)     General Gait Details: pt denies dizziness, no unsteadiness or LOB observed, HR up to 129 bpm and RR 34 during ambulation  Stairs            Wheelchair Mobility    Modified Rankin (Stroke Patients Only)       Balance Overall balance assessment: No apparent balance deficits (not formally assessed)                                           Pertinent Vitals/Pain Pain Assessment: No/denies pain    Home Living Family/patient expects to be discharged to:: Private residence Living Arrangements:  Spouse/significant other   Type of Home: House       Home Layout: One level Home Equipment: None      Prior Function Level of Independence: Independent               Hand Dominance        Extremity/Trunk Assessment        Lower Extremity Assessment Lower Extremity Assessment: Overall WFL for tasks assessed    Cervical / Trunk Assessment Cervical / Trunk Assessment: Normal  Communication   Communication: HOH  Cognition Arousal/Alertness: Awake/alert Behavior During Therapy: WFL for tasks assessed/performed Overall Cognitive Status: Within Functional Limits for tasks assessed                                        General Comments      Exercises     Assessment/Plan    PT Assessment Patent does not need any further PT services  PT Problem List         PT Treatment Interventions      PT Goals (Current goals can be found in the Care Plan section)  Acute Rehab PT Goals PT Goal Formulation: All assessment and education complete, DC therapy    Frequency  Barriers to discharge        Co-evaluation               AM-PAC PT "6 Clicks" Mobility  Outcome Measure Help needed turning from your back to your side while in a flat bed without using bedrails?: None Help needed moving from lying on your back to sitting on the side of a flat bed without using bedrails?: None Help needed moving to and from a bed to a chair (including a wheelchair)?: None Help needed standing up from a chair using your arms (e.g., wheelchair or bedside chair)?: None Help needed to walk in hospital room?: None Help needed climbing 3-5 steps with a railing? : None 6 Click Score: 24    End of Session   Activity Tolerance: Patient tolerated treatment well Patient left: in bed;with call bell/phone within reach;with bed alarm set;with family/visitor present   PT Visit Diagnosis: Difficulty in walking, not elsewhere classified (R26.2)    Time:  7026-3785 PT Time Calculation (min) (ACUTE ONLY): 15 min   Charges:   PT Evaluation $PT Eval Low Complexity: 1 Low         Kati PT, DPT Acute Rehabilitation Services Pager: [phone] Office: [phone]  York Ram E 08/11/2019, 12:26 PM

## 2019-08-11 NOTE — Plan of Care (Signed)
  Problem: Bowel/Gastric: Goal: Will show no signs and symptoms of gastrointestinal bleeding Outcome: Progressing   Problem: Fluid Volume: Goal: Will show no signs and symptoms of excessive bleeding Outcome: Progressing   

## 2019-08-11 NOTE — Progress Notes (Signed)
    Progress Note   Subjective  Chief Complaint: Melena and heme positive stool  Today, patient reports that he is feeling well, no further bloody bowel movements, no abdominal pain.  (Of note his hearing aid battery did go out, so conversation with him is quite difficult)   Objective   Vital signs in last 24 hours: Temp:  [98.1 F (36.7 C)-99 F (37.2 C)] 98.4 F (36.9 C) (06/14 0418) Pulse Rate:  [94-128] 109 (06/14 0418) Resp:  [10-22] 17 (06/14 0418) BP: (82-147)/(48-99) 139/76 (06/14 0418) SpO2:  [94 %-100 %] 98 % (06/14 0418) Weight:  [149.7 kg-152.8 kg] 152.8 kg (06/14 0418) Last BM Date: 08/10/19 General:   Obese AA male in NAD Heart:  Regular rate and rhythm; no murmurs Lungs: Respirations even and unlabored, lungs CTA bilaterally Abdomen:  Soft, nontender and nondistended. Normal bowel sounds. Extremities:  Without edema. Psych:  Cooperative. Normal mood and affect.  Intake/Output from previous day: 06/13 0701 - 06/14 0700 In: 2795.8 [I.V.:2297.8; Blood:498] Out: 1100 [Urine:1100] Intake/Output this shift: Total I/O In: 220 [P.O.:220] Out: -   Lab Results: Recent Labs    08/09/19 2216 08/10/19 2345 08/11/19 0424  WBC 19.4*  --  10.6*  HGB 8.3* 8.0* 7.4*  HCT 25.3* 24.3* 22.6*  PLT 233  --  186   BMET Recent Labs    08/09/19 2216 08/11/19 0424  NA 137 139  K 3.6 4.0  CL 101 104  CO2 24 27  GLUCOSE 126* 109*  BUN 41* 19  CREATININE 1.43* 0.95  CALCIUM 8.1* 8.0*   LFT Recent Labs    08/09/19 2216  PROT 6.4*  ALBUMIN 3.2*  AST 16  ALT 14  ALKPHOS 67  BILITOT 0.3   PT/INR Recent Labs    08/09/19 2216  LABPROT 13.8  INR 1.1     Assessment / Plan:   Assessment: 1.  Melena and heme positive stool: EGD 08/10/2019 with nonbleeding duodenal ulcer with a clot and underlying visible vessel which was treated with bipolar cautery as well as gastritis, recommend continue pantoprazole infusion for 72 hours 2.  Acute blood loss anemia: From  above; hemoglobin 8--> 7.4 overnight 3.  AKI: Creatinine improved 4.  Hard of hearing  Plan: 1.  Again per recommendations from Dr. Fuller Plan continue Pantoprazole infusion for total of 72 hours, then twice daily PPI 2.  Continue to monitor hemoglobin with transfusion as needed less than 7 3.  Continue other supportive measures 4.  Please await any further recommendations from Dr. Bryan Lemma later today  Thank you for kind consultation, we will continue to follow.    LOS: 1 day   Levin Erp  08/11/2019, 9:59 AM

## 2019-08-11 NOTE — Progress Notes (Signed)
D/c'd home in stable condition.

## 2019-08-11 NOTE — TOC Progression Note (Signed)
Transition of Care [facility]) - Progression Note    Patient Details  Name: Peter Camacho MRN: [id number] Date of Birth: [birth year]  [facility] CM/SW Contact  Purcell Mouton, RN Phone Number: 08/11/2019, 2:51 PM  Clinical Narrative:     Pt from home with spouse and plan to return home with no needs at present time.   Expected Discharge Plan: Home/Self Care Barriers to Discharge: No Barriers Identified  Expected Discharge Plan and Services Expected Discharge Plan: Home/Self Care   Discharge Planning Services: CM Consult   Living arrangements for the past 2 months: Single Family Home                                       Social Determinants of Health (SDOH) Interventions    Readmission Risk Interventions No flowsheet data found.

## 2019-08-11 NOTE — Progress Notes (Signed)
PROGRESS NOTE    Peter Camacho    Code Status: Full Code  GEZ:[id number] DOB: [birth year] DOA: 08/09/2019 LOS: 1 days  PCP: [name], DO CC:  Chief Complaint  Patient presents with  . Weakness       Hospital Summary   This is a 59 year old male with history of hypertension and morbid obesity who presented with complaints of blood per rectum and gross hematuria.  He was started on PPI infusion and GI was consulted.  He underwent endoscopy on 08/10/2019 with Dr. Fuller Plan which showed: Distal esophageal erythema, gastritis, nonbleeding duodenal ulcer with clot underlying visible vessel which was injected and treated with bipolar cautery and nonbleeding duodenal ulcer with no stigmata of bleeding.  Plan is to continue IV PPI for total of 72 hours and when ready, discharged on twice daily PPI    A & P   Principal Problem:   GI bleeding Active Problems:   Essential hypertension   Symptomatic anemia   AKI (acute kidney injury) ([place])   Duodenal ulcer with hemorrhage   1. UGI bleed s/p EGD on 6/13 a. EGD: Distal esophageal erythema, gastritis, nonbleeding duodenal ulcer with clot underlying visible vessel which was injected and treated with bipolar cautery and nonbleeding duodenal ulcer with no stigmata of bleeding. b. Bloody BM this a.m. c. Repeat H&H d. Follow-up H. pylori antibody e. Per GI: 72 hours IV Protonix, followed by Protonix 40 mg twice daily x8 weeks.  No NSAIDs, trend CBC  2. Morbid Obesity Secondary to high caloric intake with poor diet a. dietitian to see patient for dietary education b. Advised to stop drinking soda and decrease caloric intake c. Body mass index is 42.44 kg/m.   3. Prediabetes a. A1c 6.0 b. Lipid panel unremarkable c. Counseled on lifestyle modification  4. Hypertension a. Hypotensive on arrival and home antihypertensives held b. BP currently stable c. Add on IV Lopressor now d. AKI resolved, restart home meds  5. Sinus  tachycardia, could be from GI bleeding a. Follow-up H&H b. Continue Lopressor with hold parameters  6. AKI, resolved a. Restart home lisinopril-HCTZ and follow-up BMP in a.m.  7. Gross hematuria a. UA unremarkable b. Most likely not hematuria and actually mistaken GI bleed for hematuria c. monitor  DVT prophylaxis: SCDs Start: 08/10/19 1839   Family Communication: Patient's wife at bedside has been updated   Disposition Plan:  Status is: Inpatient  Remains inpatient appropriate because:IV treatments appropriate due to intensity of illness or inability to take PO   Dispo: The patient is from: Home              Anticipated d/c is to: Home              Anticipated d/c date is: 2 days              Patient currently is not medically stable to d/c.          Pressure injury documentation    None  Consultants  GI   Procedures  6/13 EGD   Antibiotics   Anti-infectives (From admission, onward)   None        Subjective   Patient seen and examined at bedside in no acute distress and resting comfortably. No acute events overnight. Denies any acute complaints at this time. Ambulating. Tolerating diet well.   Discussed with CNA who reported that patient had a dark bloody BM this a.m.  Objective   Vitals:   08/11/19 0406 08/11/19  0418 08/11/19 1058 08/11/19 1459  BP: (!) 147/88 139/76 (!) 126/98 122/74  Pulse:  (!) 109 (!) 107 100  Resp: 18 17 19 19   Temp: 98.6 F (37 C) 98.4 F (36.9 C) 98.2 F (36.8 C) 97.7 F (36.5 C)  TempSrc: Oral Oral Oral Oral  SpO2: 94% 98% 98% 94%  Weight:  (!) 152.8 kg    Height:        Intake/Output Summary (Last 24 hours) at 08/11/2019 1520 Last data filed at 08/11/2019 0900 Gross per 24 hour  Intake 2217.8 ml  Output 700 ml  Net 1517.8 ml   Filed Weights   08/09/19 2205 08/10/19 2306 08/11/19 0418  Weight: (!) 152 kg (!) 149.7 kg (!) 152.8 kg    Examination:  Physical Exam Vitals and nursing note reviewed. Exam  conducted with a chaperone present.  Constitutional:      Appearance: Normal appearance. He is obese.  HENT:     Head: Normocephalic and atraumatic.  Eyes:     Conjunctiva/sclera: Conjunctivae normal.  Cardiovascular:     Rate and Rhythm: Regular rhythm. Tachycardia present.  Pulmonary:     Effort: Pulmonary effort is normal.     Breath sounds: Normal breath sounds.  Abdominal:     General: Abdomen is flat.     Palpations: Abdomen is soft.  Musculoskeletal:        General: No swelling or tenderness.  Skin:    Coloration: Skin is not jaundiced or pale.  Neurological:     Mental Status: He is alert. Mental status is at baseline.  Psychiatric:        Mood and Affect: Mood normal.        Behavior: Behavior normal.     Data Reviewed: I have personally reviewed following labs and imaging studies  CBC: Recent Labs  Lab 08/09/19 2216 08/10/19 2345 08/11/19 0424  WBC 19.4*  --  10.6*  HGB 8.3* 8.0* 7.4*  HCT 25.3* 24.3* 22.6*  MCV 92.7  --  93.4  PLT 233  --  097   Basic Metabolic Panel: Recent Labs  Lab 08/09/19 2216 08/11/19 0424  NA 137 139  K 3.6 4.0  CL 101 104  CO2 24 27  GLUCOSE 126* 109*  BUN 41* 19  CREATININE 1.43* 0.95  CALCIUM 8.1* 8.0*  MG  --  2.0   GFR: Estimated Creatinine Clearance: 130.7 mL/min (by C-G formula based on SCr of 0.95 mg/dL). Liver Function Tests: Recent Labs  Lab 08/09/19 2216  AST 16  ALT 14  ALKPHOS 67  BILITOT 0.3  PROT 6.4*  ALBUMIN 3.2*   No results for input(s): LIPASE, AMYLASE in the last 168 hours. No results for input(s): AMMONIA in the last 168 hours. Coagulation Profile: Recent Labs  Lab 08/09/19 2216  INR 1.1   Cardiac Enzymes: No results for input(s): CKTOTAL, CKMB, CKMBINDEX, TROPONINI in the last 168 hours. BNP (last 3 results) No results for input(s): PROBNP in the last 8760 hours. HbA1C: Recent Labs    08/11/19 0424  HGBA1C 6.0*   CBG: Recent Labs  Lab 08/09/19 2209  GLUCAP 118*    Lipid Profile: Recent Labs    08/11/19 0424  CHOL 129  HDL 47  LDLCALC 67  TRIG 77  CHOLHDL 2.7   Thyroid Function Tests: No results for input(s): TSH, T4TOTAL, FREET4, T3FREE, THYROIDAB in the last 72 hours. Anemia Panel: No results for input(s): VITAMINB12, FOLATE, FERRITIN, TIBC, IRON, RETICCTPCT in the last 72 hours. Sepsis  Labs: Recent Labs  Lab 08/09/19 2239  LATICACIDVEN 2.0*    Recent Results (from the past 240 hour(s))  SARS Coronavirus 2 by RT PCR (hospital order, performed in [facility] hospital lab) Nasopharyngeal Nasopharyngeal Swab     Status: None   Collection Time: 08/09/19 11:44 PM   Specimen: Nasopharyngeal Swab  Result Value Ref Range Status   SARS Coronavirus 2 NEGATIVE NEGATIVE Final    Comment: (NOTE) SARS-CoV-2 target nucleic acids are NOT DETECTED.  The SARS-CoV-2 RNA is generally detectable in upper and lower respiratory specimens during the acute phase of infection. The lowest concentration of SARS-CoV-2 viral copies this assay can detect is 250 copies / mL. A negative result does not preclude SARS-CoV-2 infection and should not be used as the sole basis for treatment or other patient management decisions.  A negative result may occur with improper specimen collection / handling, submission of specimen other than nasopharyngeal swab, presence of viral mutation(s) within the areas targeted by this assay, and inadequate number of viral copies (<250 copies / mL). A negative result must be combined with clinical observations, patient history, and epidemiological information.  Fact Sheet for Patients:   StrictlyIdeas.no  Fact Sheet for Healthcare Providers: BankingDealers.co.za  This test is not yet approved or  cleared by the [place] FDA and has been authorized for detection and/or diagnosis of SARS-CoV-2 by FDA under an Emergency Use Authorization (EUA).  This EUA will remain in effect  (meaning this test can be used) for the duration of the COVID-19 declaration under Section 564(b)(1) of the Act, 21 U.S.C. section 360bbb-3(b)(1), unless the authorization is terminated or revoked sooner.  Performed at [facility], [address] [place], [place] [address]          Radiology Studies: No results found.      Scheduled Meds: . [START ON 08/13/2019] pantoprazole  40 mg Intravenous Q12H  . sodium chloride flush  3 mL Intravenous Q12H   Continuous Infusions: . pantoprozole (PROTONIX) infusion 8 mg/hr (08/11/19 0942)     Time spent: 28 minutes with over 50% of the time coordinating the patient's care    Harold Hedge, DO [facility] Pager [phone]  Call night coverage person covering after 7pm

## 2019-08-12 LAB — BASIC METABOLIC PANEL
Anion gap: 8 (ref 5–15)
BUN: 18 mg/dL (ref 6–20)
CO2: 29 mmol/L (ref 22–32)
Calcium: 8.3 mg/dL — ABNORMAL LOW (ref 8.9–10.3)
Chloride: 102 mmol/L (ref 98–111)
Creatinine, Ser: 1.12 mg/dL (ref 0.61–1.24)
GFR calc Af Amer: >60 mL/min (ref >60)
GFR calc non Af Amer: >60 mL/min (ref >60)
Glucose, Bld: 105 mg/dL — ABNORMAL HIGH (ref 70–99)
Potassium: 3.5 mmol/L (ref 3.5–5.1)
Sodium: 139 mmol/L (ref 135–145)

## 2019-08-12 LAB — HEMOGLOBIN AND HEMATOCRIT, BLOOD
HCT: 23.1 % — ABNORMAL LOW (ref 39.0–52.0)
HCT: 23.4 % — ABNORMAL LOW (ref 39.0–52.0)
Hemoglobin: 7.5 g/dL — ABNORMAL LOW (ref 13.0–17.0)
Hemoglobin: 7.8 g/dL — ABNORMAL LOW (ref 13.0–17.0)

## 2019-08-12 LAB — CBC
HCT: 22.7 % — ABNORMAL LOW (ref 39.0–52.0)
Hemoglobin: 7.2 g/dL — ABNORMAL LOW (ref 13.0–17.0)
MCH: 30.3 pg (ref 26.0–34.0)
MCHC: 31.7 g/dL (ref 30.0–36.0)
MCV: 95.4 fL (ref 80.0–100.0)
Platelets: 206 10*3/uL (ref 150–400)
RBC: 2.38 MIL/uL — ABNORMAL LOW (ref 4.22–5.81)
RDW: 16.1 % — ABNORMAL HIGH (ref 11.5–15.5)
WBC: 9.4 10*3/uL (ref 4.0–10.5)
nRBC: 0.4 % — ABNORMAL HIGH (ref 0.0–0.2)

## 2019-08-12 NOTE — Plan of Care (Signed)
  RD consulted for nutrition education regarding weight loss. Pt with hearing loss. Spoke with both patient and girlfriend.   Body mass index is 43.25 kg/m. Pt meets criteria for morbid obesity based on current BMI.  RD provided "Weight Loss Tips" handout from the Academy of [employer]. Emphasized the importance of serving sizes and provided examples of correct portions of common foods. Discussed importance of controlled and consistent intake throughout the day. Provided examples of ways to balance meals/snacks and encouraged intake of high-fiber, whole grain complex carbohydrates. Emphasized the importance of hydration with calorie-free beverages and limiting sugar-sweetened beverages. Encouraged pt to discuss physical activity options with physician. Teach back method used.  Expect good compliance.  Current diet order is Heart Healthy/CHO Modified, patient is consuming approximately 100% of meals at this time. Labs and medications reviewed. No further nutrition interventions warranted at this time. RD contact information provided. If additional nutrition issues arise, please re-consult RD.  Lockie Pares., RD, LDN, CNSC See AMiON for contact information

## 2019-08-12 NOTE — Progress Notes (Addendum)
PROGRESS NOTE    Peter Camacho    Code Status: Full Code  NIO:[id number] DOB: Oct 28, 1960 DOA: 08/09/2019 LOS: 2 days  PCP: [name], DO CC:  Chief Complaint  Patient presents with  . Weakness       Hospital Summary   This is a 59 year old male with history of hypertension and morbid obesity who presented with complaints of blood per rectum and gross hematuria.  He was started on PPI infusion and GI was consulted.  He underwent endoscopy on 08/10/2019 with Dr. Fuller Plan which showed: Distal esophageal erythema, gastritis, nonbleeding duodenal ulcer with clot underlying visible vessel which was injected and treated with bipolar cautery and nonbleeding duodenal ulcer with no stigmata of bleeding.  Plan is to continue IV PPI for total of 72 hours and when ready, discharged on twice daily PPI x8 weeks    A & P   Principal Problem:   GI bleeding Active Problems:   Essential hypertension   Symptomatic anemia   AKI (acute kidney injury) ([place])   Duodenal ulcer with hemorrhage   1. UGI bleed s/p EGD on 6/13 s/p 2 unit PRBCs a. EGD: Distal esophageal erythema, gastritis, nonbleeding duodenal ulcer with clot underlying visible vessel which was injected and treated with bipolar cautery and nonbleeding duodenal ulcer with no stigmata of bleeding. b. Hb trended down overnight c. Repeat H&H d. Follow-up H. pylori antibody e. Per GI: Continue Protonix infusion overnight (total 72 hours), followed by Protonix 40 mg twice daily x8 weeks at discharge.  No NSAIDs, trend CBC  2. Morbid Obesity Secondary to high caloric intake with poor diet a. dietitian to see patient for dietary education b. Advised to stop drinking soda and decrease caloric intake c. Body mass index is 42.44 kg/m.   3. Prediabetes a. A1c 6.0 b. Lipid panel unremarkable c. Counseled on lifestyle modification  4. Hypertension a. Hypotensive on arrival and home antihypertensives held b. BP currently  stable c. AKI resolved, restarted home meds  5. Sinus tachycardia, could be from GI bleeding a. Stable, Continue Lopressor as needed with hold parameters b. In comparison to GI note, breath sounds CTA bilaterally on my exam  6. AKI, resolved a. Restarted home lisinopril-HCTZ and follow-up BMP in a.m.  7. Gross hematuria a. UA unremarkable b. Most likely not hematuria and actually mistaken GI bleed for hematuria  DVT prophylaxis: SCDs Start: 08/10/19 1839   Family Communication: Patient's wife at bedside has been updated   Disposition Plan: Likely discharge tomorrow if Hb stable and pending GI recommendations Status is: Inpatient  Remains inpatient appropriate because:IV treatments appropriate due to intensity of illness or inability to take PO   Dispo: The patient is from: Home              Anticipated d/c is to: Home              Anticipated d/c date is: 1 day              Patient currently is not medically stable to d/c.          Pressure injury documentation    None  Consultants  GI   Procedures  6/13 EGD   Antibiotics   Anti-infectives (From admission, onward)   None        Subjective   Examined at bedside with wife present no acute distress resting comfortably.  Denies any recurrent bloody bowel movements.  No complaints or overnight events.  Objective   Vitals:  08/11/19 1459 08/11/19 1856 08/11/19 2247 08/12/19 0613  BP: 122/74 135/84 (!) 123/99 (!) 154/71  Pulse: 100 96 (!) 107 92  Resp: 19 19 19 18   Temp: 97.7 F (36.5 C) 98 F (36.7 C) 98.2 F (36.8 C) 97.6 F (36.4 C)  TempSrc: Oral Oral Oral Oral  SpO2: 94% 98% 97% 92%  Weight:      Height:        Intake/Output Summary (Last 24 hours) at 08/12/2019 1305 Last data filed at 08/12/2019 1019 Gross per 24 hour  Intake 760 ml  Output 1225 ml  Net -465 ml   Filed Weights   08/09/19 2205 08/10/19 2306 08/11/19 0418  Weight: (!) 152 kg (!) 149.7 kg (!) 152.8 kg     Examination:  Physical Exam Vitals and nursing note reviewed.  Constitutional:      Appearance: Normal appearance.  HENT:     Head: Normocephalic and atraumatic.  Eyes:     Conjunctiva/sclera: Conjunctivae normal.  Cardiovascular:     Rate and Rhythm: Normal rate and regular rhythm.  Pulmonary:     Effort: Pulmonary effort is normal.     Breath sounds: Normal breath sounds.  Abdominal:     General: Abdomen is flat.     Palpations: Abdomen is soft.  Musculoskeletal:        General: No swelling or tenderness.  Skin:    Coloration: Skin is not jaundiced or pale.  Neurological:     Mental Status: He is alert. Mental status is at baseline.  Psychiatric:        Mood and Affect: Mood normal.        Behavior: Behavior normal.     Data Reviewed: I have personally reviewed following labs and imaging studies  CBC: Recent Labs  Lab 08/09/19 2216 08/10/19 2345 08/11/19 0424 08/11/19 1602 08/11/19 2121 08/12/19 0447 08/12/19 1112  WBC 19.4*  --  10.6*  --   --  9.4  --   HGB 8.3*   < > 7.4* 7.5* 7.4* 7.2* 7.5*  HCT 25.3*   < > 22.6* 23.1* 22.5* 22.7* 23.1*  MCV 92.7  --  93.4  --   --  95.4  --   PLT 233  --  186  --   --  206  --    < > = values in this interval not displayed.   Basic Metabolic Panel: Recent Labs  Lab 08/09/19 2216 08/11/19 0424 08/12/19 0447  NA 137 139 139  K 3.6 4.0 3.5  CL 101 104 102  CO2 24 27 29   GLUCOSE 126* 109* 105*  BUN 41* 19 18  CREATININE 1.43* 0.95 1.12  CALCIUM 8.1* 8.0* 8.3*  MG  --  2.0  --    GFR: Estimated Creatinine Clearance: 110.9 mL/min (by C-G formula based on SCr of 1.12 mg/dL). Liver Function Tests: Recent Labs  Lab 08/09/19 2216  AST 16  ALT 14  ALKPHOS 67  BILITOT 0.3  PROT 6.4*  ALBUMIN 3.2*   No results for input(s): LIPASE, AMYLASE in the last 168 hours. No results for input(s): AMMONIA in the last 168 hours. Coagulation Profile: Recent Labs  Lab 08/09/19 2216  INR 1.1   Cardiac  Enzymes: No results for input(s): CKTOTAL, CKMB, CKMBINDEX, TROPONINI in the last 168 hours. BNP (last 3 results) No results for input(s): PROBNP in the last 8760 hours. HbA1C: Recent Labs    08/11/19 0424  HGBA1C 6.0*   CBG: Recent Labs  Lab  08/09/19 2209  GLUCAP 118*   Lipid Profile: Recent Labs    08/11/19 0424  CHOL 129  HDL 47  LDLCALC 67  TRIG 77  CHOLHDL 2.7   Thyroid Function Tests: No results for input(s): TSH, T4TOTAL, FREET4, T3FREE, THYROIDAB in the last 72 hours. Anemia Panel: No results for input(s): VITAMINB12, FOLATE, FERRITIN, TIBC, IRON, RETICCTPCT in the last 72 hours. Sepsis Labs: Recent Labs  Lab 08/09/19 2239  LATICACIDVEN 2.0*    Recent Results (from the past 240 hour(s))  SARS Coronavirus 2 by RT PCR (hospital order, performed in [facility] hospital lab) Nasopharyngeal Nasopharyngeal Swab     Status: None   Collection Time: 08/09/19 11:44 PM   Specimen: Nasopharyngeal Swab  Result Value Ref Range Status   SARS Coronavirus 2 NEGATIVE NEGATIVE Final    Comment: (NOTE) SARS-CoV-2 target nucleic acids are NOT DETECTED.  The SARS-CoV-2 RNA is generally detectable in upper and lower respiratory specimens during the acute phase of infection. The lowest concentration of SARS-CoV-2 viral copies this assay can detect is 250 copies / mL. A negative result does not preclude SARS-CoV-2 infection and should not be used as the sole basis for treatment or other patient management decisions.  A negative result may occur with improper specimen collection / handling, submission of specimen other than nasopharyngeal swab, presence of viral mutation(s) within the areas targeted by this assay, and inadequate number of viral copies (<250 copies / mL). A negative result must be combined with clinical observations, patient history, and epidemiological information.  Fact Sheet for Patients:   StrictlyIdeas.no  Fact Sheet for  Healthcare Providers: BankingDealers.co.za  This test is not yet approved or  cleared by the [place] FDA and has been authorized for detection and/or diagnosis of SARS-CoV-2 by FDA under an Emergency Use Authorization (EUA).  This EUA will remain in effect (meaning this test can be used) for the duration of the COVID-19 declaration under Section 564(b)(1) of the Act, 21 U.S.C. section 360bbb-3(b)(1), unless the authorization is terminated or revoked sooner.  Performed at [facility], [address], [place], [place] [address]          Radiology Studies: No results found.      Scheduled Meds: . lisinopril  20 mg Oral Daily   And  . hydrochlorothiazide  25 mg Oral Daily  . [START ON 08/13/2019] pantoprazole  40 mg Intravenous Q12H  . sodium chloride flush  3 mL Intravenous Q12H   Continuous Infusions: . pantoprozole (PROTONIX) infusion 8 mg/hr (08/12/19 0934)     Time spent: 25 minutes with over 50% of the time coordinating the patient's care    Harold Hedge, DO [facility] Pager [phone]  Call night coverage person covering after 7pm

## 2019-08-12 NOTE — Progress Notes (Signed)
[facility] Progress Note  CC:  GI bleed, melena   Subjective:  He reports feeling well. He ate oatmeal and chicken broth for breakfast. Drank coffee. No N/V. No abdominal pain. He passed a small black loose stool yesterday, no BM today.    Objective:   EGD 08/10/2019: - Erythema in the distal esophagus. - Gastritis. - Non-bleeding duodenal ulcer with a clot and underlying visible vessel. Injected. Treated with bipolar cautery. - Non-bleeding duodenal ulcer with no stigmata of bleeding. - No specimens collected.  Vital signs in last 24 hours: Temp:  [97.6 F (36.4 C)-98.2 F (36.8 C)] 97.6 F (36.4 C) (06/15 6314) Pulse Rate:  [92-107] 92 (06/15 0613) Resp:  [18-19] 18 (06/15 0613) BP: (122-154)/(71-99) 154/71 (06/15 0613) SpO2:  [92 %-98 %] 92 % (06/15 0613) Last BM Date: 08/10/19 General:   Alert obese male in NAD Heart: RRR, no murmur.  Pulm:  Breath sounds diminished throughout with few scattered insp/exp wheezes.  Abdomen: Soft, nondistended. Nontender. + BS x 4 quadrants.  Extremities:  Without edema. Neurologic:  Alert and  oriented x4;  grossly normal neurologically. Psych:  Alert and cooperative. Normal mood and affect.  Intake/Output from previous day: 06/14 0701 - 06/15 0700 In: 620 [P.O.:620] Out: 925 [Urine:925] Intake/Output this shift: Total I/O In: 360 [P.O.:360] Out: 300 [Urine:300]  Lab Results: Recent Labs    08/09/19 2216 08/10/19 2345 08/11/19 0424 08/11/19 0424 08/11/19 1602 08/11/19 2121 08/12/19 0447  WBC 19.4*  --  10.6*  --   --   --  9.4  HGB 8.3*   < > 7.4*   < > 7.5* 7.4* 7.2*  HCT 25.3*   < > 22.6*   < > 23.1* 22.5* 22.7*  PLT 233  --  186  --   --   --  206   < > = values in this interval not displayed.   BMET Recent Labs    08/09/19 2216 08/11/19 0424 08/12/19 0447  NA 137 139 139  K 3.6 4.0 3.5  CL 101 104 102  CO2 24 27 29   GLUCOSE 126* 109* 105*  BUN 41* 19 18  CREATININE 1.43* 0.95 1.12    CALCIUM 8.1* 8.0* 8.3*   LFT Recent Labs    08/09/19 2216  PROT 6.4*  ALBUMIN 3.2*  AST 16  ALT 14  ALKPHOS 67  BILITOT 0.3   PT/INR Recent Labs    08/09/19 2216  LABPROT 13.8  INR 1.1   Hepatitis Panel No results for input(s): HEPBSAG, HCVAB, HEPAIGM, HEPBIGM in the last 72 hours.  No results found.  22. 59 year old male with GI bleed/melena and anemia.  EGD 08/10/2019 with nonbleeding duodenal ulcer with a clot and underlying visible vessel which was treated with bipolar cautery as well as gastritis, recommend continue Pantoprazole infusion for 72 hours. Hg 8-> -> 7.4 -> 7.05 -> 7.4 -> 7.2. He passed a small melenic stool yesterday. No BM today. No N/V or abdominal pain. Hemodynamically stable.  -Repeat H&H now -Continue Pantoprazole 8mg /hr infusion until tomorrow. Plan for PPI po bid x 8 weeks at time of discharge.  -Heart healthy diet as tolerated -Continue to monitor for active GI bleeding.   2.  AKI: Creatinine improving. Cr. 1.12.   3.  Hard of hearing  4. Asthma. Diminished breath sounds with scattered insp/exp wheezes on exam today. No SOB or cough.  -Management deferred to the hospitalist   Further recommendations per Dr. Bryan Lemma  Principal Problem:   GI bleeding Active Problems:   Essential hypertension   Symptomatic anemia   AKI (acute kidney injury) ([place])   Duodenal ulcer with hemorrhage     LOS: 2 days   Noralyn Pick  08/12/2019, 10:54 AM

## 2019-08-12 NOTE — Progress Notes (Signed)
MD made aware of today H/H. SRP, RN

## 2019-08-13 DIAGNOSIS — I9589 Other hypotension: Secondary | ICD-10-CM

## 2019-08-13 DIAGNOSIS — E861 Hypovolemia: Secondary | ICD-10-CM

## 2019-08-13 MED ORDER — PANTOPRAZOLE SODIUM 40 MG PO TBEC: 40.0000 mg | DELAYED_RELEASE_TABLET | Freq: Two times a day (BID) | ORAL | 1 refills | Status: DC

## 2019-08-13 MED FILL — PANTOPRAZOLE SOD DR 40 MG T: 40 | 30 days supply | Qty: 60 | Fill #0

## 2019-08-13 NOTE — Discharge Instructions (Signed)
General Weight Loss Tips  Eat at least three times per day.  Pay attention to your body. When you feel like you have had enough to eat, stop. Quit before you feel full, stuffed, or sick from eating. You can have more if you are really hungry.  If you still feel hungry or unsatisfied after a meal or snack, wait at least 10 minutes before you have more food. Often, the craving will go away.  Drink plenty of calorie-free drinks (water, tea, coffee, diet soda). You may be thirsty, not hungry.  Pick lean meats, low-fat or nonfat cheese, and fat-free (skim) or low-fat (1%) milk instead of higher-fat/higher-calorie choices.  Get plenty of fiber. Vegetables, fruits, and whole grains are good sources. Have a high-fiber cereal every day.  Cut back on sugar. For example, drink less fruit juice and regular soda.  Limit the amount of alcohol (beer, wine, and liquor) that you drink.  Keep all food in the kitchen. Eat only in a chosen place, such as at the table. Don't eat in the car or the bedroom or in front of the TV. Food Preparation  Plan meals ahead of time.  Try cooking methods that cut calories: ? Cook without adding fat (bake, broil, roast, boil). ? Use nonstick cooking sprays instead of butter or oil. You can also use wine, broth, or fruit juice instead of oil when cooking. ? Use low-calorie foods instead of high-calorie ones when possible.  Lacinda Axon only what you need for one meal (don't make leftovers).  If you do make extra portions, put them away as soon as they are ready so you can save them for other meals. Store the leftovers in containers that you can't see through.  Cook when you are not hungry. For example, cook and refrigerate tomorrow's dinner after you have finished eating tonight.  Make fruits, vegetables, and other low-calorie foods part of each meal.  Drink water while you cook. Mealtimes  Drink a glass of water before you eat. Drink more during meals.  Use smaller  plates, bowls, glasses, and serving spoons.  Divide your plate into four equal parts. Use one part for meat, one for starch (such as pasta, rice, potatoes, or bread), and two for nonstarchy vegetables.  Do not put serving dishes on the table. This will make it harder to take a second portion.  Put salad dressing on the side instead of mixing it with or pouring onto your salad. Then dip your fork into the dressing before you spear a bite of salad.  Change your usual place at the table.  Make mealtime special by using pretty dishes, napkins, and glasses.  Eat slowly. Take a few 1-minute breaks from eating during meals. Put your fork down between bites. Cut your food one bite at a time.  Enjoy fruit for dessert instead of cake, pie, or other sweets.  Leave a little food on your plate. (You control the food; it doesn't control you.)  Remove your plate as soon as you've finished eating. Snacking Snacking can be part of your plan for healthy weight loss. You can eat six times per day as long as you plan what to eat and don't eat too many calories.  Plan ahead. Be sure to have healthy snacks on hand. If the right food is not there, you may be more likely to eat whatever is available, such as candy, cookies, chips, leftovers, or other "quick" choices.  Keep low-calorie snacks in a special part of the refrigerator.  Good choices include the following: ? Reduced-fat string cheese, low-calorie yogurt, and fat-free milk. ? Washed, bite-size pieces of raw vegetables, such as carrots, celery, pepper strips, cucumbers, broccoli, and cauliflower. Serve with low-calorie dips. ? Fresh fruit. Eating and Emotions Do you use eating to deal with feelings other than hunger, such as boredom, being tired, or stress? If you eat for these reasons, here are some other things you can try:  Call a friend for support.  Use inspirational quotes to help you avoid the temptation to eat.  Take a warm bath or  shower.  Listen to music or a relaxation CD.  Take a walk.   Peptic Ulcer  A peptic ulcer is a sore in the lining of the stomach (gastric ulcer) or the first part of the small intestine (duodenal ulcer). The ulcer causes a gradual wearing away (erosion) of the deeper tissue. What are the causes? Normally, the lining of the stomach and the small intestine protects them from the acid that digests food. The protective lining can be damaged by:  An infection caused by a type of bacteria called Helicobacter pylori or H. pylori.  Regular use of NSAIDs, such as ibuprofen or aspirin.  Rare tumors in the stomach, small intestine, or pancreas (Zollinger-Ellison syndrome). What increases the risk? The following factors may make you more likely to develop this condition:  Smoking.  Having a family history of ulcer disease.  Drinking alcohol.  Having been hospitalized in an intensive care unit (ICU). What are the signs or symptoms? Symptoms of this condition include:  Persistent burning pain in the area between the chest and the belly button. The pain may be worse on an empty stomach and at night.  Heartburn.  Nausea and vomiting.  Bloating. If the ulcer results in bleeding, it can cause:  Black, tarry stools.  Vomiting of bright red blood.  Vomiting of material that looks like coffee grounds. How is this diagnosed? This condition may be diagnosed based on:  Your medical history and a physical exam.  Various tests or procedures, such as: ? Blood tests, stool tests, or breath tests to check for the H. pylori bacteria. ? An X-ray exam (upper gastrointestinal series) of the esophagus, stomach, and small intestine. ? Upper endoscopy. The health care provider examines the esophagus, stomach, and small intestine using a small flexible tube that has a video camera at the end. ? Biopsy. A tissue sample is removed to be examined under a microscope. How is this treated? Treatment for  this condition may include:  Eliminating the cause of the ulcer, such as smoking or use of NSAIDs, and limiting alcohol and caffeine intake.  Medicines to reduce the amount of acid in your digestive tract.  Antibiotic medicines, if the ulcer is caused by an H. pylori infection.  An upper endoscopy may be used to treat a bleeding ulcer.  Surgery. This may be needed if the bleeding is severe or if the ulcer created a hole somewhere in the digestive system. Follow these instructions at home:  Do not drink alcohol if your health care provider tells you not to drink.  Do not use any products that contain nicotine or tobacco, such as cigarettes, e-cigarettes, and chewing tobacco. If you need help quitting, ask your health care provider.  Take over-the-counter and prescription medicines only as told by your health care provider. ? Do not use over-the-counter medicines in place of prescription medicines unless your health care provider approves. ? Do not take aspirin,  ibuprofen, or other NSAIDs unless your health care provider told you to do so.  Take over-the-counter and prescription medicines only as told by your health care provider.  Keep all follow-up visits as told by your health care provider. This is important. Contact a health care provider if:  Your symptoms do not improve within 7 days of starting treatment.  You have ongoing indigestion or heartburn. Get help right away if:  You have sudden, sharp, or persistent pain in your abdomen.  You have bloody or dark black, tarry stools.  You vomit blood or material that looks like coffee grounds.  You become light-headed or you feel faint.  You become weak.  You become sweaty or clammy. Summary  A peptic ulcer is a sore in the lining of the stomach (gastric ulcer) or the first part of the small intestine (duodenal ulcer). The ulcer causes a gradual wearing away (erosion) of the deeper tissue.  Do not use any products that  contain nicotine or tobacco, such as cigarettes, e-cigarettes, and chewing tobacco. If you need help quitting, ask your health care provider.  Take over-the-counter and prescription medicines only as told by your health care provider. Do not use over-the-counter medicines in place of prescription medicines unless your health care provider approves.  Contact your health care provider if you have ongoing indigestion or heartburn.  Keep all follow-up visits as told by your health care provider. This is important. This information is not intended to replace advice given to you by your health care provider. Make sure you discuss any questions you have with your health care provider. Document Revised: 08/21/2017 Document Reviewed: 08/21/2017 Elsevier Patient Education  [place].

## 2019-08-13 NOTE — Plan of Care (Signed)
  Problem: Education: Goal: Ability to identify signs and symptoms of gastrointestinal bleeding will improve Outcome: Progressing   Problem: Fluid Volume: Goal: Will show no signs and symptoms of excessive bleeding Outcome: Progressing   

## 2019-08-13 NOTE — Progress Notes (Signed)
Pt discharged to home, instructions reviewed with pt, acknowledged understanding. SRP, RN

## 2019-08-13 NOTE — Progress Notes (Signed)
° °  08/13/19   To Whom it may concern,  Peter Camacho was admitted to [facility] on 08/09/2019 and remained under my care in the hospital through 08/13/19.  He has been advised that he should not return to work until 08/18/19.  Sincerely,  Cherene Altes, MD [facility]  [phone]

## 2019-08-13 NOTE — Discharge Summary (Signed)
DISCHARGE SUMMARY  Peter Camacho  MR#: [id number]  DOB:February 23, 1961  Date of Admission: 08/09/2019 Date of Discharge: 08/13/2019  Attending Physician:Terryl Molinelli Hennie Duos, MD  Patient's GXQ:JJHERDEY, Peter Oyster, DO  Consults: GI  Disposition: D/C home   Follow-up Appts:  Follow-up Information    [name], DO Follow up in 1 week(s).   Specialty: Family Medicine Contact information: [address] [name] [address] [phone]        Lavena Bullion, DO. Schedule an appointment as soon as possible for a visit in 2 week(s).   Specialty: Gastroenterology Contact information: [place] [address] [phone]               Tests Needing Follow-up: -H. pylori antibody pending  -recheck Hgb  -question patient on recurrent hematuria   Discharge Diagnoses: UGIB - Duodenal Ulcer  Acute blood loss anemia Morbid obesity - Body mass index is 43.25 kg/m. Prediabetes HTN Sinus tachycardia Acute kidney injury ? Hematuria  Initial presentation: 59 year old with a history of HTN and morbid obesity who presented to the ED with bright red blood per rectum and gross hematuria.  Hospital Course: The patient was placed on a PPI infusion and GI was consulted.  He underwent endoscopy 6/13 which noted distal esophageal erythema with gastritis and a nonbleeding duodenal ulcer with clot and an underlying visible vessel which was injected and treated with cautery in addition to another nonbleeding duodenal ulcer with no stigmata of bleeding.  UGIB Status post EGD 6/13 - status post 2 units PRBC -source appears to have been duodenal ulcer which has been treated with injection/cautery -H. pylori antibody pending -to complete 72 hours of Protonix IV drip followed by Protonix 40 mg twice daily x8 weeks -avoid NSAIDs -follow-up with GI as per their recommendation  Acute blood loss anemia Due to GI bleeding  -status post 2 units PRBC -hemoglobin appears to have stabilized at 7.5-7.8  Morbid obesity - Body mass index is 43.25 kg/m.  Prediabetes A1c 6.0 -lipid panel unremarkable -patient has been counseled on need for significant lifestyle modification  HTN Antihypertensives held initially due to acute blood loss leading to hypotension -usual home meds were restarted prior to discharge  Sinus tachycardia Felt to be a simple consequence of his bleeding/hypovolemia -usual Lopressor resumed  Acute kidney injury Felt to be due to hypoperfusion in setting of acute blood loss -resolved  ?  Hematuria History is not convincing of a true hematuria -patient instructed to continue to monitor   Allergies as of 08/13/2019      Reactions   Fish-derived Products Rash      Medication List    TAKE these medications   acetaminophen 500 MG tablet Commonly known as: TYLENOL Take 500 mg by mouth every 6 (six) hours as needed for mild pain, moderate pain, fever or headache.   albuterol 108 (90 Base) MCG/ACT inhaler Commonly known as: Ventolin HFA TAKE 2 PUFFS BY MOUTH EVERY 6 HOURS AS NEEDED FOR WHEEZE OR SHORTNESS OF BREATH What changed:   how much to take  how to take this  when to take this  reasons to take this  additional instructions   lisinopril-hydrochlorothiazide 20-25 MG tablet Commonly known as: ZESTORETIC TAKE 1 TABLET BY MOUTH EVERY DAY   multivitamin with minerals Tabs tablet Take 1 tablet by mouth daily.   pantoprazole 40 MG tablet Commonly known as: Protonix Take 1 tablet (40 mg total) by mouth 2 (  two) times daily.       Day of Discharge BP (!) 150/91 (BP Location: Right Arm)    Pulse 89    Temp 98.1 F (36.7 C)    Resp 16    Ht 6\' 2"  (1.88 m)    Wt (!) 152.8 kg    SpO2 94%    BMI 43.25 kg/m   Physical Exam: General: No acute respiratory distress Lungs: Clear to auscultation bilaterally without wheezes or crackles Cardiovascular: Regular rate and rhythm  without murmur gallop or rub normal S1 and S2 Abdomen: Nontender, nondistended, soft, bowel sounds positive, no rebound, no ascites, no appreciable mass Extremities: No significant cyanosis, clubbing, or edema bilateral lower extremities  Basic Metabolic Panel: Recent Labs  Lab 08/09/19 2216 08/11/19 0424 08/12/19 0447  NA 137 139 139  K 3.6 4.0 3.5  CL 101 104 102  CO2 24 27 29   GLUCOSE 126* 109* 105*  BUN 41* 19 18  CREATININE 1.43* 0.95 1.12  CALCIUM 8.1* 8.0* 8.3*  MG  --  2.0  --     Liver Function Tests: Recent Labs  Lab 08/09/19 2216  AST 16  ALT 14  ALKPHOS 67  BILITOT 0.3  PROT 6.4*  ALBUMIN 3.2*    Coags: Recent Labs  Lab 08/09/19 2216  INR 1.1    CBC: Recent Labs  Lab 08/09/19 2216 08/10/19 2345 08/11/19 0424 08/11/19 0424 08/11/19 1602 08/11/19 2121 08/12/19 0447 08/12/19 1112 08/12/19 1654  WBC 19.4*  --  10.6*  --   --   --  9.4  --   --   HGB 8.3*   < > 7.4*   < > 7.5* 7.4* 7.2* 7.5* 7.8*  HCT 25.3*   < > 22.6*   < > 23.1* 22.5* 22.7* 23.1* 23.4*  MCV 92.7  --  93.4  --   --   --  95.4  --   --   PLT 233  --  186  --   --   --  206  --   --    < > = values in this interval not displayed.    CBG: Recent Labs  Lab 08/09/19 2209  GLUCAP 118*    Recent Results (from the past 240 hour(s))  SARS Coronavirus 2 by RT PCR (hospital order, performed in [facility] hospital lab) Nasopharyngeal Nasopharyngeal Swab     Status: None   Collection Time: 08/09/19 11:44 PM   Specimen: Nasopharyngeal Swab  Result Value Ref Range Status   SARS Coronavirus 2 NEGATIVE NEGATIVE Final    Comment: (NOTE) SARS-CoV-2 target nucleic acids are NOT DETECTED.  The SARS-CoV-2 RNA is generally detectable in upper and lower respiratory specimens during the acute phase of infection. The lowest concentration of SARS-CoV-2 viral copies this assay can detect is 250 copies / mL. A negative result does not preclude SARS-CoV-2 infection and should not be used as  the sole basis for treatment or other patient management decisions.  A negative result may occur with improper specimen collection / handling, submission of specimen other than nasopharyngeal swab, presence of viral mutation(s) within the areas targeted by this assay, and inadequate number of viral copies (<250 copies / mL). A negative result must be combined with clinical observations, patient history, and epidemiological information.  Fact Sheet for Patients:   StrictlyIdeas.no  Fact Sheet for Healthcare Providers: BankingDealers.co.za  This test is not yet approved or  cleared by the [place] FDA and has been authorized for  detection and/or diagnosis of SARS-CoV-2 by FDA under an Emergency Use Authorization (EUA).  This EUA will remain in effect (meaning this test can be used) for the duration of the COVID-19 declaration under Section 564(b)(1) of the Act, 21 U.S.C. section 360bbb-3(b)(1), unless the authorization is terminated or revoked sooner.  Performed at [facility], [address] [place], [place] [address]      Time spent in discharge (includes decision making & examination of pt): 35 minutes  08/13/2019, 4:00 PM   Cherene Altes, MD [facility]  [phone]

## 2019-08-14 ENCOUNTER — Other Ambulatory Visit: Payer: Self-pay | Admitting: Family Medicine

## 2019-08-14 ENCOUNTER — Telehealth: Payer: Self-pay | Admitting: *Deleted

## 2019-08-14 DIAGNOSIS — S31819A Unspecified open wound of right buttock, initial encounter: Secondary | ICD-10-CM

## 2019-08-14 NOTE — Telephone Encounter (Signed)
1st attempt. Unable to reach patient.   

## 2019-08-15 ENCOUNTER — Telehealth: Payer: Self-pay | Admitting: Gastroenterology

## 2019-08-15 LAB — H PYLORI, IGM, IGG, IGA AB
H Pylori IgG: 0.31 Index Value (ref 0.00–0.79)
H. Pylogi, Iga Abs: <9 units (ref 0.0–8.9)
H. Pylogi, Igm Abs: <9 units (ref 0.0–8.9)

## 2019-08-15 MED FILL — SSD 1% CREAM: 1 | 30 days supply | Qty: 50 | Fill #0

## 2019-08-15 NOTE — Telephone Encounter (Signed)
I have made two attempts and have been unable to reach patient. Pt has hospital follow up scheduled w/ PCP 08/19/19.

## 2019-08-18 NOTE — Telephone Encounter (Signed)
LMOM for patient to call back.

## 2019-08-18 NOTE — Telephone Encounter (Signed)
Pt said he did not understand the test results that was given to him and Peter Camacho and the patient called back to try to get a clearer understanding of the results  [phone]

## 2019-08-18 NOTE — Telephone Encounter (Signed)
Lmom for patient to call back 

## 2019-08-19 ENCOUNTER — Other Ambulatory Visit: Payer: Self-pay

## 2019-08-19 ENCOUNTER — Encounter: Payer: Self-pay | Admitting: *Deleted

## 2019-08-19 ENCOUNTER — Encounter: Payer: Self-pay | Admitting: Family Medicine

## 2019-08-19 ENCOUNTER — Telehealth: Payer: Self-pay | Admitting: Gastroenterology

## 2019-08-19 ENCOUNTER — Ambulatory Visit: Payer: 59 | Admitting: Family Medicine

## 2019-08-19 VITALS — BP 110/70 | HR 115 | Temp 98.2°F | Resp 16 | Ht 74.5 in | Wt 319.6 lb

## 2019-08-19 DIAGNOSIS — K269 Duodenal ulcer, unspecified as acute or chronic, without hemorrhage or perforation: Secondary | ICD-10-CM | POA: Diagnosis not present

## 2019-08-19 DIAGNOSIS — D62 Acute posthemorrhagic anemia: Secondary | ICD-10-CM

## 2019-08-19 DIAGNOSIS — I10 Essential (primary) hypertension: Secondary | ICD-10-CM

## 2019-08-19 DIAGNOSIS — K922 Gastrointestinal hemorrhage, unspecified: Secondary | ICD-10-CM

## 2019-08-19 MED ORDER — LISINOPRIL-HYDROCHLOROTHIAZIDE 20-25 MG PO TABS: 1.0000 | ORAL_TABLET | Freq: Every day | ORAL | 2 refills | Status: DC

## 2019-08-19 NOTE — Progress Notes (Signed)
Chief Complaint  Patient presents with   Hospitalization Follow-up    Subjective: Patient is a 59 y.o. male here for hosp f/u.  He is here with his girlfriend who helps provide the history.  On 6-11, the patient started having lightheadedness and abdominal pain.  He went to the emergency department and was diagnosed with acute blood loss anemia and a duodenal ulcer after an endoscopy was done.  The ulcer was handled by the GI team while inpatient.  He received 2 units of packed red blood cells and his hemoglobin remained stable upon discharge.  He is currently back on his blood pressure medication and reports feeling better overall but is still fatigued.  He is having intermittent abdominal pain though it is better.  He is eating and drinking normally.  He is not having dark/tarry stools any longer.  He is wondering when he can go back to work.  Past Medical History:  Diagnosis Date   At risk for sleep apnea    STOP-BANG= 5    SENT TO PCP 03-17-2014   Hard of hearing    History of seizure    HX menigitis x2  in middle and high school - residual seizure's on medications--  last seizure age 51   Hypertension    Personal history of meningitis    X2  MIDDLE AND HIGH SCHOOL--  residual seizure's on medication -- last seizure age 3   Prostate cancer ([place]) MONITORED BY DR OTTELIN   stage T1c, Gleason 3+4, PSA 12.79, vol 38.5cc    Objective: BP 110/70 (BP Location: Left Arm, Cuff Size: Large)    Pulse (!) 115    Temp 98.2 F (36.8 C) (Temporal)    Resp 16    Ht 6' 2.5" (1.892 m)    Wt (!) 319 lb 9.6 oz (145 kg)    SpO2 95%    BMI 40.49 kg/m  General: Awake, appears stated age HEENT: MMM, EOMi, no icterus Heart: Regular rhythm, tachycardic, no bruits, no lower extremity edema Lungs: CTAB, no rales, wheezes or rhonchi. No accessory muscle use Abdomen: Large panniculus, bowel sounds present, nontender, nondistended, no masses or organomegaly Psych: Age appropriate judgment and insight,  normal affect and mood  Assessment and Plan: Acute blood loss anemia - Plan: CBC  Upper GI bleed - Plan: CBC  Duodenal ulcer - Plan: CBC  Essential hypertension - Plan: lisinopril-hydrochlorothiazide (ZESTORETIC) 20-25 MG tablet  Check CBC, start supplemental iron, stay hydrated.  Continue current blood pressure medication.  I doubt he is hypovolemic any longer based off her oral intake and current blood pressure.  Filled out form stating return to work in 1 week on 08/26/2019. The patient and his girlfriend voiced understanding and agreement to the plan.  [place], DO 08/19/19  12:03 PM

## 2019-08-19 NOTE — Telephone Encounter (Signed)
Spoke to patients wife today and let her know that Dr Bryan Lemma did not have any results for him. He has an appointment with Dr Nani Ravens today and they will discuss lab results and next plan of care. She voiced understanding.

## 2019-08-19 NOTE — Patient Instructions (Addendum)
Give [place] 2-3 business days to get the results of your labs back.   Keep the diet clean and stay active.  Stay hydrated.   Consider ferrous sulfate (over the counter iron) up to 3 times daily.   Let us know if you need anything.  Healthy Eating Plan Many factors influence your heart health, including eating and exercise habits. Heart (coronary) risk increases with abnormal blood fat (lipid) levels. Heart-healthy meal planning includes limiting unhealthy fats, increasing healthy fats, and making other small dietary changes. This includes maintaining a healthy body weight to help keep lipid levels within a normal range.  WHAT IS MY PLAN?  Your health care provider recommends that you:  Drink a glass of water before meals to help with satiety.  Eat slowly.  An alternative to the water is to add Metamucil. This will help with satiety as well. It does contain calories, unlike water.  WHAT TYPES OF FAT SHOULD I CHOOSE?  Choose healthy fats more often. Choose monounsaturated and polyunsaturated fats, such as olive oil and canola oil, flaxseeds, walnuts, almonds, and seeds.  Eat more omega-3 fats. Good choices include salmon, mackerel, sardines, tuna, flaxseed oil, and ground flaxseeds. Aim to eat fish at least two times each week.  Avoid foods with partially hydrogenated oils in them. These contain trans fats. Examples of foods that contain trans fats are stick margarine, some tub margarines, cookies, crackers, and other baked goods. If you are going to avoid a fat, this is the one to avoid!  WHAT GENERAL GUIDELINES DO I NEED TO FOLLOW?  Check food labels carefully to identify foods with trans fats. Avoid these types of options when possible.  Fill one half of your plate with vegetables and green salads. Eat 4-5 servings of vegetables per day. A serving of vegetables equals 1 cup of raw leafy vegetables,  cup of raw or cooked cut-up vegetables, or  cup of vegetable juice.  Fill one  fourth of your plate with whole grains. Look for the word "whole" as the first word in the ingredient list.  Fill one fourth of your plate with lean protein foods.  Eat 4-5 servings of fruit per day. A serving of fruit equals one medium whole fruit,  cup of dried fruit,  cup of fresh, frozen, or canned fruit. Try to avoid fruits in cups/syrups as the sugar content can be high.  Eat more foods that contain soluble fiber. Examples of foods that contain this type of fiber are apples, broccoli, carrots, beans, peas, and barley. Aim to get 20-30 g of fiber per day.  Eat more home-cooked food and less restaurant, buffet, and fast food.  Limit or avoid alcohol.  Limit foods that are high in starch and sugar.  Avoid fried foods when able.  Cook foods by using methods other than frying. Baking, boiling, grilling, and broiling are all great options. Other fat-reducing suggestions include: ? Removing the skin from poultry. ? Removing all visible fats from meats. ? Skimming the fat off of stews, soups, and gravies before serving them. ? Steaming vegetables in water or broth.  Lose weight if you are overweight. Losing just 5-10% of your initial body weight can help your overall health and prevent diseases such as diabetes and heart disease.  Increase your consumption of nuts, legumes, and seeds to 4-5 servings per week. One serving of dried beans or legumes equals  cup after being cooked, one serving of nuts equals 1 ounces, and one serving of seeds equals  ounce or 1 tablespoon.  WHAT ARE GOOD FOODS CAN I EAT? Grains Grainy breads (try to find bread that is 3 g of fiber per slice or greater), oatmeal, light popcorn. Whole-grain cereals. Rice and pasta, including brown rice and those that are made with whole wheat. Edamame pasta is a great alternative to grain pasta. It has a higher protein content. Try to avoid significant consumption of white bread, sugary cereals, or pastries/baked  goods.  Vegetables All vegetables. Cooked white potatoes do not count as vegetables.  Fruits All fruits, but limit pineapple and bananas as these fruits have a higher sugar content.  Meats and Other Protein Sources Lean, well-trimmed beef, veal, pork, and lamb. Chicken and [place] without skin. All fish and shellfish. Wild duck, rabbit, pheasant, and venison. Egg whites or low-cholesterol egg substitutes. Dried beans, peas, lentils, and tofu.Seeds and most nuts.  Dairy Low-fat or nonfat cheeses, including ricotta, string, and mozzarella. Skim or 1% milk that is liquid, powdered, or evaporated. Buttermilk that is made with low-fat milk. Nonfat or low-fat yogurt. Soy/Almond milk are good alternatives if you cannot handle dairy.  Beverages Water is the best for you. Sports drinks with less sugar are more desirable unless you are a highly active athlete.  Sweets and Desserts Sherbets and fruit ices. Honey, jam, marmalade, jelly, and syrups. Dark chocolate.  Eat all sweets and desserts in moderation.  Fats and Oils Nonhydrogenated (trans-free) margarines. Vegetable oils, including soybean, sesame, sunflower, olive, peanut, safflower, corn, canola, and cottonseed. Salad dressings or mayonnaise that are made with a vegetable oil. Limit added fats and oils that you use for cooking, baking, salads, and as spreads.  Other Cocoa powder. Coffee and tea. Most condiments.  The items listed above may not be a complete list of recommended foods or beverages. Contact your dietitian for more options.

## 2019-08-20 ENCOUNTER — Other Ambulatory Visit: Payer: Self-pay | Admitting: Family Medicine

## 2019-08-20 DIAGNOSIS — E611 Iron deficiency: Secondary | ICD-10-CM

## 2019-08-20 LAB — CBC
HCT: 27.4 % — ABNORMAL LOW (ref 39.0–52.0)
Hemoglobin: 9 g/dL — ABNORMAL LOW (ref 13.0–17.0)
MCHC: 32.7 g/dL (ref 30.0–36.0)
MCV: 92.9 fl (ref 78.0–100.0)
Platelets: 496 10*3/uL — ABNORMAL HIGH (ref 150.0–400.0)
RBC: 2.95 Mil/uL — ABNORMAL LOW (ref 4.22–5.81)
RDW: 16 % — ABNORMAL HIGH (ref 11.5–15.5)
WBC: 10.1 10*3/uL (ref 4.0–10.5)

## 2019-08-20 NOTE — Telephone Encounter (Signed)
Spoke to patients wife to let her know that a follow up appointment has been scheduled for 10/15/19. She can call the office to check for cancellations to get in sooner. Wife voiced understanding.

## 2019-08-20 NOTE — Telephone Encounter (Signed)
Pt stated that he was returning your call  Please call pt at [phone].

## 2019-09-04 ENCOUNTER — Other Ambulatory Visit: Payer: Self-pay

## 2019-09-04 ENCOUNTER — Other Ambulatory Visit (INDEPENDENT_AMBULATORY_CARE_PROVIDER_SITE_OTHER): Payer: 59

## 2019-09-04 DIAGNOSIS — E611 Iron deficiency: Secondary | ICD-10-CM

## 2019-09-05 LAB — CBC
HCT: 30.5 % — ABNORMAL LOW (ref 39.0–52.0)
Hemoglobin: 10 g/dL — ABNORMAL LOW (ref 13.0–17.0)
MCHC: 32.8 g/dL (ref 30.0–36.0)
MCV: 89.4 fl (ref 78.0–100.0)
Platelets: 367 10*3/uL (ref 150.0–400.0)
RBC: 3.41 Mil/uL — ABNORMAL LOW (ref 4.22–5.81)
RDW: 16 % — ABNORMAL HIGH (ref 11.5–15.5)
WBC: 9.1 10*3/uL (ref 4.0–10.5)

## 2019-09-12 ENCOUNTER — Other Ambulatory Visit: Payer: Self-pay

## 2019-09-12 DIAGNOSIS — J302 Other seasonal allergic rhinitis: Secondary | ICD-10-CM

## 2019-09-12 MED ORDER — ALBUTEROL SULFATE HFA 108 (90 BASE) MCG/ACT IN AERS: 2.0000 | INHALATION_SPRAY | Freq: Four times a day (QID) | RESPIRATORY_TRACT | 5 refills | Status: DC | PRN

## 2019-09-12 MED FILL — PANTOPRAZOLE SOD DR 40 MG T: 40 | 30 days supply | Qty: 60 | Fill #1

## 2019-09-12 MED FILL — ALBUTEROL SULFATE HFA 108 (: 108 (90 BAS | 25 days supply | Qty: 9 | Fill #0

## 2019-09-20 ENCOUNTER — Other Ambulatory Visit: Payer: Self-pay

## 2019-09-20 ENCOUNTER — Encounter (HOSPITAL_BASED_OUTPATIENT_CLINIC_OR_DEPARTMENT_OTHER): Payer: Self-pay | Admitting: Emergency Medicine

## 2019-09-20 ENCOUNTER — Emergency Department (HOSPITAL_BASED_OUTPATIENT_CLINIC_OR_DEPARTMENT_OTHER)
Admission: EM | Admit: 2019-09-20 | Discharge: 2019-09-20 | Disposition: A | Discharge status: Home / Self Care | Payer: 59 | Attending: Emergency Medicine | Admitting: Emergency Medicine

## 2019-09-20 DIAGNOSIS — Y939 Activity, unspecified: Secondary | ICD-10-CM | POA: Insufficient documentation

## 2019-09-20 DIAGNOSIS — X58XXXA Exposure to other specified factors, initial encounter: Secondary | ICD-10-CM | POA: Diagnosis not present

## 2019-09-20 DIAGNOSIS — Z8669 Personal history of other diseases of the nervous system and sense organs: Secondary | ICD-10-CM | POA: Insufficient documentation

## 2019-09-20 DIAGNOSIS — S00261A Insect bite (nonvenomous) of right eyelid and periocular area, initial encounter: Secondary | ICD-10-CM | POA: Diagnosis not present

## 2019-09-20 DIAGNOSIS — Y999 Unspecified external cause status: Secondary | ICD-10-CM | POA: Insufficient documentation

## 2019-09-20 DIAGNOSIS — T63443A Toxic effect of venom of bees, assault, initial encounter: Secondary | ICD-10-CM

## 2019-09-20 DIAGNOSIS — S50861A Insect bite (nonvenomous) of right forearm, initial encounter: Secondary | ICD-10-CM | POA: Diagnosis not present

## 2019-09-20 DIAGNOSIS — Z87891 Personal history of nicotine dependence: Secondary | ICD-10-CM | POA: Insufficient documentation

## 2019-09-20 DIAGNOSIS — H02843 Edema of right eye, unspecified eyelid: Secondary | ICD-10-CM | POA: Insufficient documentation

## 2019-09-20 DIAGNOSIS — I10 Essential (primary) hypertension: Secondary | ICD-10-CM | POA: Insufficient documentation

## 2019-09-20 DIAGNOSIS — Y9289 Other specified places as the place of occurrence of the external cause: Secondary | ICD-10-CM | POA: Insufficient documentation

## 2019-09-20 MED ORDER — DIPHENHYDRAMINE HCL 25 MG PO CAPS
25.0000 mg | ORAL_CAPSULE | Freq: Once | ORAL | Status: AC
  Administered 2019-09-20: 25 mg via ORAL
  Filled 2019-09-20: qty 1

## 2019-09-20 NOTE — Discharge Instructions (Addendum)
Take antihistamines to help with swelling.  Apply ice to help reduce the swelling.  You can take over-the-counter medications such as Tylenol as needed for pain and discomfort.  The discomfort and swelling should resolve over the next couple of days

## 2019-09-20 NOTE — ED Notes (Signed)
Pt discharged to home. Discharge instructions have been discussed with patient and/or family members. Pt verbally acknowledges understanding d/c instructions, and endorses comprehension to checkout at registration before leaving.  °
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
## 2019-09-20 NOTE — ED Triage Notes (Signed)
Pt states that he was stung by a bee to the right eye and to right arm. Pt states that he was stung about 1 hour ago - he has noted swelling to his right eye  - pt states that his right eye is sore

## 2019-10-15 ENCOUNTER — Ambulatory Visit: Payer: 59 | Admitting: Gastroenterology

## 2019-10-15 ENCOUNTER — Encounter: Payer: Self-pay | Admitting: Gastroenterology

## 2019-10-15 ENCOUNTER — Other Ambulatory Visit: Payer: Self-pay | Admitting: Family Medicine

## 2019-10-15 VITALS — BP 124/84 | HR 99 | Ht 74.0 in | Wt 319.4 lb

## 2019-10-15 DIAGNOSIS — K269 Duodenal ulcer, unspecified as acute or chronic, without hemorrhage or perforation: Secondary | ICD-10-CM | POA: Diagnosis not present

## 2019-10-15 DIAGNOSIS — D62 Acute posthemorrhagic anemia: Secondary | ICD-10-CM

## 2019-10-15 DIAGNOSIS — R1013 Epigastric pain: Secondary | ICD-10-CM | POA: Diagnosis not present

## 2019-10-15 DIAGNOSIS — Z1211 Encounter for screening for malignant neoplasm of colon: Secondary | ICD-10-CM | POA: Diagnosis not present

## 2019-10-15 DIAGNOSIS — I10 Essential (primary) hypertension: Secondary | ICD-10-CM

## 2019-10-15 DIAGNOSIS — Z1212 Encounter for screening for malignant neoplasm of rectum: Secondary | ICD-10-CM

## 2019-10-15 MED ORDER — CLENPIQ 10-3.5-12 MG-GM -GM/160ML PO SOLN
1.0000 | ORAL | 0 refills | Status: DC
Start: 2019-10-15 — End: 2020-10-17

## 2019-10-15 MED ORDER — PANTOPRAZOLE SODIUM 40 MG PO TBEC: 40.0000 mg | DELAYED_RELEASE_TABLET | Freq: Every day | ORAL | 2 refills | Status: DC

## 2019-10-15 MED FILL — CLENPIQ 10-3.5-12 MG-GM -GM: 10-3.5-12 M | 1 days supply | Qty: 320 | Fill #0

## 2019-10-15 MED FILL — PANTOPRAZOLE SOD DR 40 MG T: 40 | 30 days supply | Qty: 30 | Fill #0

## 2019-10-15 NOTE — Patient Instructions (Addendum)
If you are age 60 or older, your body mass index should be between 23-30. Your Body mass index is 41.01 kg/m. If this is out of the aforementioned range listed, please consider follow up with your Primary Care Provider.  If you are age 58 or younger, your body mass index should be between 19-25. Your Body mass index is 41.01 kg/m. If this is out of the aformentioned range listed, please consider follow up with your Primary Care Provider.   You have been scheduled for an endoscopy and colonoscopy. Please follow the written instructions given to you at your visit today. Please pick up your prep supplies at the pharmacy within the next 1-3 days. If you use inhalers (even only as needed), please bring them with you on the day of your procedure.  We have sent the following medications to your pharmacy for you to pick up at your convenience: Protonix 40 daily Clenpiq  Your provider has requested that you go to the basement level for lab work AT [place], [name]. Press "B" on the elevator. The lab is located at the first door on the left as you exit the elevator. IN TWO MONTHS.   It was a pleasure to see you today!  Vito Cirigliano, D.O.
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
## 2019-10-22 ENCOUNTER — Encounter: Payer: Self-pay | Admitting: Gastroenterology

## 2019-11-05 ENCOUNTER — Ambulatory Visit (AMBULATORY_SURGERY_CENTER): Payer: 59 | Admitting: Gastroenterology

## 2019-11-05 ENCOUNTER — Other Ambulatory Visit: Payer: Self-pay

## 2019-11-05 ENCOUNTER — Encounter: Payer: Self-pay | Admitting: Gastroenterology

## 2019-11-05 VITALS — BP 139/98 | HR 80 | Temp 97.5°F | Resp 13 | Ht 74.0 in | Wt 319.0 lb

## 2019-11-05 DIAGNOSIS — K635 Polyp of colon: Secondary | ICD-10-CM

## 2019-11-05 DIAGNOSIS — K269 Duodenal ulcer, unspecified as acute or chronic, without hemorrhage or perforation: Secondary | ICD-10-CM

## 2019-11-05 DIAGNOSIS — D122 Benign neoplasm of ascending colon: Secondary | ICD-10-CM

## 2019-11-05 DIAGNOSIS — D125 Benign neoplasm of sigmoid colon: Secondary | ICD-10-CM

## 2019-11-05 DIAGNOSIS — K640 First degree hemorrhoids: Secondary | ICD-10-CM

## 2019-11-05 DIAGNOSIS — K5730 Diverticulosis of large intestine without perforation or abscess without bleeding: Secondary | ICD-10-CM

## 2019-11-05 DIAGNOSIS — K2970 Gastritis, unspecified, without bleeding: Secondary | ICD-10-CM | POA: Diagnosis not present

## 2019-11-05 DIAGNOSIS — Z1211 Encounter for screening for malignant neoplasm of colon: Secondary | ICD-10-CM

## 2019-11-05 DIAGNOSIS — D124 Benign neoplasm of descending colon: Secondary | ICD-10-CM | POA: Diagnosis not present

## 2019-11-05 MED ORDER — SODIUM CHLORIDE 0.9 % IV SOLN: 500.0000 mL | Freq: Once | INTRAVENOUS | Status: DC

## 2019-11-05 NOTE — Op Note (Signed)
[name]: Peter Camacho Procedure Date: 11/05/2019 10:32 AM MRN: [id number] Endoscopist: Gerrit Heck , MD Age: 59 Referring MD:  Date of Birth: 1960/12/15 Gender: Male Account #: [id number] Procedure:                Upper GI endoscopy Indications:              Follow-up of duodenal ulcer, Follow-up of gastritis                           59 yo male admitted in 07/2019 with upper GI bleed.                            EGD with duodenal ulcer with visible vessel,                            treated with bipolar cautery and epinephrine. No                            recurrence of bleeding and Hgb/Hct has continued to                            improve appropriately. He presents today to                            evaluate for appropriate mucosal healing. Medicines:                Monitored Anesthesia Care Procedure:                Pre-Anesthesia Assessment:                           - Prior to the procedure, a History and Physical                            was performed, and patient medications and                            allergies were reviewed. The patient's tolerance of                            previous anesthesia was also reviewed. The risks                            and benefits of the procedure and the sedation                            options and risks were discussed with the patient.                            All questions were answered, and informed consent                            was obtained. Prior Anticoagulants: The patient has  taken no previous anticoagulant or antiplatelet                            agents. ASA Grade Assessment: III - A patient with                            severe systemic disease. After reviewing the risks                            and benefits, the patient was deemed in                            satisfactory condition to undergo the procedure.                           After obtaining  informed consent, the endoscope was                            passed under direct vision. Throughout the                            procedure, the patient's blood pressure, pulse, and                            oxygen saturations were monitored continuously. The                            Endoscope was introduced through the mouth, and                            advanced to the second part of duodenum. The upper                            GI endoscopy was accomplished without difficulty.                            The patient tolerated the procedure well. Scope In: Scope Out: Findings:                 The examined esophagus was normal.                           The entire examined stomach was normal.                           The ampulla, duodenal bulb, first portion of the                            duodenum and second portion of the duodenum were                            normal. The previously noted duodenal ulcer has  since healed well. Complications:            No immediate complications. Estimated Blood Loss:     Estimated blood loss: none. Impression:               - Normal esophagus.                           - Normal stomach.                           - Normal ampulla, duodenal bulb, first portion of                            the duodenum and second portion of the duodenum.                           - No specimens collected. Recommendation:           - Patient has a contact number available for                            emergencies. The signs and symptoms of potential                            delayed complications were discussed with the                            patient. Return to normal activities tomorrow.                            Written discharge instructions were provided to the                            patient.                           - Resume previous diet.                           - Continue present medications.                            - Return to GI clinic PRN. Gerrit Heck, MD 11/05/2019 11:24:01 AM

## 2019-11-05 NOTE — Progress Notes (Signed)
Called to room to assist during endoscopic procedure.  Patient ID and intended procedure confirmed with present staff. Received instructions for my participation in the procedure from the performing physician.  

## 2019-11-05 NOTE — Progress Notes (Signed)
pt tolerated well. VSS. awake and to recovery. Report given to RN.  

## 2019-11-05 NOTE — Progress Notes (Signed)
Pt's states no medical or surgical changes since previsit or office visit. Reviewed w/ pt-PH  Vs-H Carr,RN

## 2019-11-05 NOTE — Patient Instructions (Signed)
Please read handouts provided. Continue present medications. Await pathology results. Consider a fiber supplement. Return to GI clinic as needed.       YOU HAD AN ENDOSCOPIC PROCEDURE TODAY AT [facility]:   Refer to the procedure report that was given to you for any specific questions about what was found during the examination.  If the procedure report does not answer your questions, please call your gastroenterologist to clarify.  If you requested that your care partner not be given the details of your procedure findings, then the procedure report has been included in a sealed envelope for you to review at your convenience later.  YOU SHOULD EXPECT: Some feelings of bloating in the abdomen. Passage of more gas than usual.  Walking can help get rid of the air that was put into your GI tract during the procedure and reduce the bloating. If you had a lower endoscopy (such as a colonoscopy or flexible sigmoidoscopy) you may notice spotting of blood in your stool or on the toilet paper. If you underwent a bowel prep for your procedure, you may not have a normal bowel movement for a few days.  Please Note:  You might notice some irritation and congestion in your nose or some drainage.  This is from the oxygen used during your procedure.  There is no need for concern and it should clear up in a day or so.  SYMPTOMS TO REPORT IMMEDIATELY:   Following lower endoscopy (colonoscopy or flexible sigmoidoscopy):  Excessive amounts of blood in the stool  Significant tenderness or worsening of abdominal pains  Swelling of the abdomen that is new, acute  Fever of 100F or higher   Following upper endoscopy (EGD)  Vomiting of blood or coffee ground material  New chest pain or pain under the shoulder blades  Painful or persistently difficult swallowing  New shortness of breath  Fever of 100F or higher  Black, tarry-looking stools  For urgent or emergent issues, a  gastroenterologist can be reached at any hour by calling [phone]. Do not use MyChart messaging for urgent concerns.    DIET:  We do recommend a small meal at first, but then you may proceed to your regular diet.  Drink plenty of fluids but you should avoid alcoholic beverages for 24 hours.  ACTIVITY:  You should plan to take it easy for the rest of today and you should NOT DRIVE or use heavy machinery until tomorrow (because of the sedation medicines used during the test).    FOLLOW UP: Our staff will call the number listed on your records 48-72 hours following your procedure to check on you and address any questions or concerns that you may have regarding the information given to you following your procedure. If we do not reach you, we will leave a message.  We will attempt to reach you two times.  During this call, we will ask if you have developed any symptoms of COVID 19. If you develop any symptoms (ie: fever, flu-like symptoms, shortness of breath, cough etc.) before then, please call [phone].  If you test positive for Covid 19 in the 2 weeks post procedure, please call and report this information to [place].    If any biopsies were taken you will be contacted by phone or by letter within the next 1-3 weeks.  Please call us at [phone] if you have not heard about the biopsies in 3 weeks.    SIGNATURES/CONFIDENTIALITY: You and/or your care  partner have signed paperwork which will be entered into your electronic medical record.  These signatures attest to the fact that that the information above on your After Visit Summary has been reviewed and is understood.  Full responsibility of the confidentiality of this discharge information lies with you and/or your care-partner.

## 2019-11-05 NOTE — Op Note (Signed)
[facility] Patient Name: Peter Camacho Procedure Date: 11/05/2019 10:32 AM MRN: [id number] Endoscopist: Gerrit Heck , MD Age: 59 Referring MD:  Date of Birth: [birth year] Gender: Male Account #: [id number] Procedure:                Colonoscopy Indications:              Screening for colorectal malignant neoplasm, This                            is the patient's first colonoscopy Medicines:                Monitored Anesthesia Care Procedure:                Pre-Anesthesia Assessment:                           - Prior to the procedure, a History and Physical                            was performed, and patient medications and                            allergies were reviewed. The patient's tolerance of                            previous anesthesia was also reviewed. The risks                            and benefits of the procedure and the sedation                            options and risks were discussed with the patient.                            All questions were answered, and informed consent                            was obtained. Prior Anticoagulants: The patient has                            taken no previous anticoagulant or antiplatelet                            agents. ASA Grade Assessment: III - A patient with                            severe systemic disease. After reviewing the risks                            and benefits, the patient was deemed in                            satisfactory condition to undergo the procedure.  After obtaining informed consent, the colonoscope                            was passed under direct vision. Throughout the                            procedure, the patient's blood pressure, pulse, and                            oxygen saturations were monitored continuously. The                            Colonoscope was introduced through the anus and                            advanced to the the  terminal ileum. The colonoscopy                            was performed without difficulty. The patient                            tolerated the procedure well. The quality of the                            bowel preparation was good. The terminal ileum,                            ileocecal valve, appendiceal orifice, and rectum                            were photographed. Scope In: 10:54:37 AM Scope Out: 11:15:42 AM Scope Withdrawal Time: 0 hours 18 minutes 56 seconds  Total Procedure Duration: 0 hours 21 minutes 5 seconds  Findings:                 The perianal and digital rectal examinations were                            normal.                           Four sessile polyps were found in the descending                            colon (2) and ascending colon (2). The polyps were                            3 to 8 mm in size. These polyps were removed with a                            cold snare. Resection and retrieval were complete.                            Estimated blood loss was minimal.  Three sessile polyps were found in the sigmoid                            colon. The polyps were 1 to 3 mm in size. The                            smallest (1 mm) polyp was removed with a cold                            biopsy forceps. Resection and retrieval were                            complete. The larger 2 polyps (3 mm each) were                            removed with a cold snare. Resection and retrieval                            were complete. Estimated blood loss was minimal.                           Multiple small and large-mouthed diverticula were                            found in the entire colon.                           Non-bleeding internal hemorrhoids were found during                            retroflexion. The hemorrhoids were small.                           The terminal ileum appeared normal. Complications:            No immediate  complications. Estimated Blood Loss:     Estimated blood loss was minimal. Impression:               - Four 3 to 8 mm polyps in the descending colon and                            in the ascending colon, removed with a cold snare.                            Resected and retrieved.                           - Three 1 to 3 mm polyps in the sigmoid colon,                            removed with a cold biopsy forceps and removed with  a cold snare. Resected and retrieved.                           - Diverticulosis in the entire examined colon.                           - Non-bleeding internal hemorrhoids.                           - The examined portion of the ileum was normal. Recommendation:           - Patient has a contact number available for                            emergencies. The signs and symptoms of potential                            delayed complications were discussed with the                            patient. Return to normal activities tomorrow.                            Written discharge instructions were provided to the                            patient.                           - Resume previous diet.                           - Continue present medications.                           - Await pathology results.                           - Repeat colonoscopy for surveillance based on                            pathology results.                           - Return to GI office PRN.                           - Use fiber, for example Citrucel, Fibercon, Konsyl                            or Metamucil. Gerrit Heck, MD 11/05/2019 11:28:53 AM

## 2019-11-07 ENCOUNTER — Telehealth: Payer: Self-pay

## 2019-11-07 NOTE — Telephone Encounter (Signed)
°  Follow up Call-  Call back number 11/05/2019  Post procedure Call Back phone  # [phone]  Permission to leave phone message Yes  Some recent data might be hidden     Patient questions:  Do you have a fever, pain , or abdominal swelling? No. Pain Score  0 *  Have you tolerated food without any problems? Yes.    Have you been able to return to your normal activities? Yes.    Do you have any questions about your discharge instructions: Diet   No. Medications  No. Follow up visit  No.  Do you have questions or concerns about your Care? No.  Actions: * If pain score is 4 or above: No action needed, pain <4.  1. Have you developed a fever since your procedure? no  2.   Have you had an respiratory symptoms (SOB or cough) since your procedure? no  3.   Have you tested positive for COVID 19 since your procedure no  4.   Have you had any family members/close contacts diagnosed with the COVID 19 since your procedure?  no   If yes to any of these questions please route to Joylene John, RN and Joella Prince, RN

## 2019-11-10 ENCOUNTER — Encounter: Payer: Self-pay | Admitting: Gastroenterology

## 2019-11-11 ENCOUNTER — Encounter: Payer: 59 | Admitting: Family Medicine

## 2019-11-12 MED FILL — ALBUTEROL SULFATE HFA 108 (: 108 (90 BAS | 25 days supply | Qty: 9 | Fill #1

## 2019-12-19 ENCOUNTER — Other Ambulatory Visit: Payer: Self-pay | Admitting: Family Medicine

## 2019-12-19 ENCOUNTER — Telehealth: Payer: Self-pay | Admitting: Family Medicine

## 2019-12-19 DIAGNOSIS — J302 Other seasonal allergic rhinitis: Secondary | ICD-10-CM

## 2019-12-19 MED FILL — ALBUTEROL SULFATE HFA 108 (: 108 (90 BAS | 25 days supply | Qty: 9 | Fill #0

## 2019-12-19 NOTE — Telephone Encounter (Signed)
Error

## 2020-02-04 ENCOUNTER — Other Ambulatory Visit: Payer: Self-pay

## 2020-02-04 DIAGNOSIS — J302 Other seasonal allergic rhinitis: Secondary | ICD-10-CM

## 2020-02-05 ENCOUNTER — Other Ambulatory Visit: Payer: Self-pay | Admitting: *Deleted

## 2020-02-05 DIAGNOSIS — J302 Other seasonal allergic rhinitis: Secondary | ICD-10-CM

## 2020-02-05 MED ORDER — ALBUTEROL SULFATE HFA 108 (90 BASE) MCG/ACT IN AERS: INHALATION_SPRAY | RESPIRATORY_TRACT | 1 refills | Status: DC

## 2020-02-07 ENCOUNTER — Other Ambulatory Visit: Payer: Self-pay | Admitting: Family Medicine

## 2020-02-07 DIAGNOSIS — I10 Essential (primary) hypertension: Secondary | ICD-10-CM

## 2020-04-04 ENCOUNTER — Other Ambulatory Visit: Payer: Self-pay | Admitting: Family Medicine

## 2020-04-04 DIAGNOSIS — J302 Other seasonal allergic rhinitis: Secondary | ICD-10-CM

## 2020-06-14 ENCOUNTER — Other Ambulatory Visit: Payer: Self-pay | Admitting: Family Medicine

## 2020-06-14 DIAGNOSIS — J302 Other seasonal allergic rhinitis: Secondary | ICD-10-CM

## 2020-06-15 ENCOUNTER — Other Ambulatory Visit: Payer: Self-pay

## 2020-06-15 ENCOUNTER — Emergency Department (HOSPITAL_BASED_OUTPATIENT_CLINIC_OR_DEPARTMENT_OTHER): Payer: 59

## 2020-06-15 ENCOUNTER — Emergency Department (HOSPITAL_BASED_OUTPATIENT_CLINIC_OR_DEPARTMENT_OTHER)
Admission: EM | Admit: 2020-06-15 | Discharge: 2020-06-15 | Disposition: A | Discharge status: Home / Self Care | Payer: 59 | Attending: Emergency Medicine | Admitting: Emergency Medicine

## 2020-06-15 ENCOUNTER — Encounter (HOSPITAL_BASED_OUTPATIENT_CLINIC_OR_DEPARTMENT_OTHER): Payer: Self-pay | Admitting: *Deleted

## 2020-06-15 DIAGNOSIS — I10 Essential (primary) hypertension: Secondary | ICD-10-CM | POA: Diagnosis not present

## 2020-06-15 DIAGNOSIS — Y92410 Unspecified street and highway as the place of occurrence of the external cause: Secondary | ICD-10-CM | POA: Diagnosis not present

## 2020-06-15 DIAGNOSIS — M545 Low back pain, unspecified: Secondary | ICD-10-CM | POA: Insufficient documentation

## 2020-06-15 DIAGNOSIS — Z8546 Personal history of malignant neoplasm of prostate: Secondary | ICD-10-CM | POA: Insufficient documentation

## 2020-06-15 DIAGNOSIS — J45909 Unspecified asthma, uncomplicated: Secondary | ICD-10-CM | POA: Insufficient documentation

## 2020-06-15 DIAGNOSIS — Z79899 Other long term (current) drug therapy: Secondary | ICD-10-CM | POA: Diagnosis not present

## 2020-06-15 DIAGNOSIS — Z923 Personal history of irradiation: Secondary | ICD-10-CM | POA: Insufficient documentation

## 2020-06-15 DIAGNOSIS — Z87891 Personal history of nicotine dependence: Secondary | ICD-10-CM | POA: Insufficient documentation

## 2020-06-15 MED ORDER — LIDOCAINE 5 % EX PTCH
1.0000 | MEDICATED_PATCH | CUTANEOUS | Status: DC
  Administered 2020-06-15: 1 via TRANSDERMAL
  Filled 2020-06-15: qty 1

## 2020-06-15 MED ORDER — LIDOCAINE 5 % EX PTCH: 1.0000 | MEDICATED_PATCH | CUTANEOUS | 0 refills | Status: DC

## 2020-06-15 MED ORDER — NAPROXEN 500 MG PO TABS: 500.0000 mg | ORAL_TABLET | Freq: Two times a day (BID) | ORAL | 0 refills | Status: DC

## 2020-06-15 MED ORDER — KETOROLAC TROMETHAMINE 15 MG/ML IJ SOLN
15.0000 mg | Freq: Once | INTRAMUSCULAR | Status: AC
  Administered 2020-06-15: 15 mg via INTRAMUSCULAR
  Filled 2020-06-15: qty 1
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
## 2020-06-15 NOTE — Discharge Instructions (Signed)
Continue doing back stretches/exercises that PT has recommended. Take Tylenol 3 times a day as needed for pain. If you need breakthrough pain control, take naproxen twice a day with meals.  It is very important that you are taking your Protonix while you take this medicine as well as that you have food on your stomach, as it may cause stomach upset. Use the Lidoderm patch to help with pain control. Follow-up with your primary care doctor for recheck of your symptoms. Return to the emergency room with any new, worsening, concerning symptoms or
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
## 2020-06-16 ENCOUNTER — Other Ambulatory Visit (HOSPITAL_BASED_OUTPATIENT_CLINIC_OR_DEPARTMENT_OTHER): Payer: Self-pay

## 2020-06-23 ENCOUNTER — Encounter: Payer: Self-pay | Admitting: Family Medicine

## 2020-06-23 ENCOUNTER — Ambulatory Visit: Payer: 59 | Admitting: Family Medicine

## 2020-06-23 ENCOUNTER — Other Ambulatory Visit (HOSPITAL_BASED_OUTPATIENT_CLINIC_OR_DEPARTMENT_OTHER): Payer: Self-pay

## 2020-06-23 ENCOUNTER — Other Ambulatory Visit: Payer: Self-pay

## 2020-06-23 VITALS — BP 132/86 | HR 96 | Temp 97.8°F | Ht 74.0 in | Wt 307.5 lb

## 2020-06-23 DIAGNOSIS — M545 Low back pain, unspecified: Secondary | ICD-10-CM | POA: Diagnosis not present

## 2020-06-23 DIAGNOSIS — I10 Essential (primary) hypertension: Secondary | ICD-10-CM | POA: Diagnosis not present

## 2020-06-23 DIAGNOSIS — L03313 Cellulitis of chest wall: Secondary | ICD-10-CM

## 2020-06-23 MED ORDER — DOXYCYCLINE HYCLATE 100 MG PO TABS
100.0000 mg | ORAL_TABLET | Freq: Two times a day (BID) | ORAL | 0 refills | Status: AC
  Filled 2020-06-23: qty 14, 7d supply, fill #0

## 2020-06-23 MED ORDER — CYCLOBENZAPRINE HCL 10 MG PO TABS
5.0000 mg | ORAL_TABLET | Freq: Three times a day (TID) | ORAL | 0 refills | Status: DC | PRN
  Filled 2020-06-23: qty 21, 7d supply, fill #0

## 2020-06-23 MED ORDER — METHYLPREDNISOLONE ACETATE 80 MG/ML IJ SUSP
80.0000 mg | Freq: Once | INTRAMUSCULAR | Status: AC
  Administered 2020-06-23: 80 mg via INTRAMUSCULAR

## 2020-06-23 MED ORDER — MELOXICAM 7.5 MG PO TABS
7.5000 mg | ORAL_TABLET | Freq: Every day | ORAL | 0 refills | Status: DC
  Filled 2020-06-23: qty 30, 30d supply, fill #0
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
## 2020-06-23 NOTE — Patient Instructions (Addendum)
Keep the diet clean and stay active.  Heat (pad or rice pillow in microwave) over affected area, 10-15 minutes twice daily.   Ice/cold pack over area for 10-15 min twice daily.  When you do wash it, use only soap and water. Do not vigorously scrub.Keep the area clean and dry.   Things to look out for: increasing pain not relieved by ibuprofen/acetaminophen, fevers, spreading redness, drainage of pus, or foul odor.  Take Flexeril (cyclobenzaprine) 1-2 hours before planned bedtime. If it makes you drowsy, do not take during the day. You can try half a tab the following night.  Let us know if you need anything.  EXERCISES  RANGE OF MOTION (ROM) AND STRETCHING EXERCISES - Low Back Pain Most people with lower back pain will find that their symptoms get worse with excessive bending forward (flexion) or arching at the lower back (extension). The exercises that will help resolve your symptoms will focus on the opposite motion.  If you have pain, numbness or tingling which travels down into your buttocks, leg or foot, the goal of the therapy is for these symptoms to move closer to your back and eventually resolve. Sometimes, these leg symptoms will get better, but your lower back pain may worsen. This is often an indication of progress in your rehabilitation. Be very alert to any changes in your symptoms and the activities in which you participated in the 24 hours prior to the change. Sharing this information with your caregiver will allow him or her to most efficiently treat your condition. These exercises may help you when beginning to rehabilitate your injury. Your symptoms may resolve with or without further involvement from your physician, physical therapist or athletic trainer. While completing these exercises, remember:   Restoring tissue flexibility helps normal motion to return to the joints. This allows healthier, less painful movement and activity.  An effective stretch should be held for at  least 30 seconds.  A stretch should never be painful. You should only feel a gentle lengthening or release in the stretched tissue. FLEXION RANGE OF MOTION AND STRETCHING EXERCISES:  STRETCH - Flexion, Single Knee to Chest   Lie on a firm bed or floor with both legs extended in front of you.  Keeping one leg in contact with the floor, bring your opposite knee to your chest. Hold your leg in place by either grabbing behind your thigh or at your knee.  Pull until you feel a gentle stretch in your low back. Hold 30 seconds.  Slowly release your grasp and repeat the exercise with the opposite side. Repeat 2 times. Complete this exercise 3 times per week.   STRETCH - Flexion, Double Knee to Chest  Lie on a firm bed or floor with both legs extended in front of you.  Keeping one leg in contact with the floor, bring your opposite knee to your chest.  Tense your stomach muscles to support your back and then lift your other knee to your chest. Hold your legs in place by either grabbing behind your thighs or at your knees.  Pull both knees toward your chest until you feel a gentle stretch in your low back. Hold 30 seconds.  Tense your stomach muscles and slowly return one leg at a time to the floor. Repeat 2 times. Complete this exercise 3 times per week.   STRETCH - Low Trunk Rotation  Lie on a firm bed or floor. Keeping your legs in front of you, bend your knees so they  are both pointed toward the ceiling and your feet are flat on the floor.  Extend your arms out to the side. This will stabilize your upper body by keeping your shoulders in contact with the floor.  Gently and slowly drop both knees together to one side until you feel a gentle stretch in your low back. Hold for 30 seconds.  Tense your stomach muscles to support your lower back as you bring your knees back to the starting position. Repeat the exercise to the other side. Repeat 2 times. Complete this exercise at least 3  times per week.   EXTENSION RANGE OF MOTION AND FLEXIBILITY EXERCISES:  STRETCH - Extension, Prone on Elbows   Lie on your stomach on the floor, a bed will be too soft. Place your palms about shoulder width apart and at the height of your head.  Place your elbows under your shoulders. If this is too painful, stack pillows under your chest.  Allow your body to relax so that your hips drop lower and make contact more completely with the floor.  Hold this position for 30 seconds.  Slowly return to lying flat on the floor. Repeat 2 times. Complete this exercise 3 times per week.   RANGE OF MOTION - Extension, Prone Press Ups  Lie on your stomach on the floor, a bed will be too soft. Place your palms about shoulder width apart and at the height of your head.  Keeping your back as relaxed as possible, slowly straighten your elbows while keeping your hips on the floor. You may adjust the placement of your hands to maximize your comfort. As you gain motion, your hands will come more underneath your shoulders.  Hold this position 30 seconds.  Slowly return to lying flat on the floor. Repeat 2 times. Complete this exercise 3 times per week.   RANGE OF MOTION- Quadruped, Neutral Spine   Assume a hands and knees position on a firm surface. Keep your hands under your shoulders and your knees under your hips. You may place padding under your knees for comfort.  Drop your head and point your tailbone toward the ground below you. This will round out your lower back like an angry cat. Hold this position for 30 seconds.  Slowly lift your head and release your tail bone so that your back sags into a large arch, like an old horse.  Hold this position for 30 seconds.  Repeat this until you feel limber in your low back.  Now, find your "sweet spot." This will be the most comfortable position somewhere between the two previous positions. This is your neutral spine. Once you have found this position,  tense your stomach muscles to support your low back.  Hold this position for 30 seconds. Repeat 2 times. Complete this exercise 3 times per week.   STRENGTHENING EXERCISES - Low Back Sprain These exercises may help you when beginning to rehabilitate your injury. These exercises should be done near your "sweet spot." This is the neutral, low-back arch, somewhere between fully rounded and fully arched, that is your least painful position. When performed in this safe range of motion, these exercises can be used for people who have either a flexion or extension based injury. These exercises may resolve your symptoms with or without further involvement from your physician, physical therapist or athletic trainer. While completing these exercises, remember:   Muscles can gain both the endurance and the strength needed for everyday activities through controlled exercises.  Complete  these exercises as instructed by your physician, physical therapist or athletic trainer. Increase the resistance and repetitions only as guided.  You may experience muscle soreness or fatigue, but the pain or discomfort you are trying to eliminate should never worsen during these exercises. If this pain does worsen, stop and make certain you are following the directions exactly. If the pain is still present after adjustments, discontinue the exercise until you can discuss the trouble with your caregiver.  STRENGTHENING - Deep Abdominals, Pelvic Tilt   Lie on a firm bed or floor. Keeping your legs in front of you, bend your knees so they are both pointed toward the ceiling and your feet are flat on the floor.  Tense your lower abdominal muscles to press your low back into the floor. This motion will rotate your pelvis so that your tail bone is scooping upwards rather than pointing at your feet or into the floor. With a gentle tension and even breathing, hold this position for 3 seconds. Repeat 2 times. Complete this exercise 3  times per week.   STRENGTHENING - Abdominals, Crunches   Lie on a firm bed or floor. Keeping your legs in front of you, bend your knees so they are both pointed toward the ceiling and your feet are flat on the floor. Cross your arms over your chest.  Slightly tip your chin down without bending your neck.  Tense your abdominals and slowly lift your trunk high enough to just clear your shoulder blades. Lifting higher can put excessive stress on the lower back and does not further strengthen your abdominal muscles.  Control your return to the starting position. Repeat 2 times. Complete this exercise 3 times per week.   STRENGTHENING - Quadruped, Opposite UE/LE Lift   Assume a hands and knees position on a firm surface. Keep your hands under your shoulders and your knees under your hips. You may place padding under your knees for comfort.  Find your neutral spine and gently tense your abdominal muscles so that you can maintain this position. Your shoulders and hips should form a rectangle that is parallel with the floor and is not twisted.  Keeping your trunk steady, lift your right hand no higher than your shoulder and then your left leg no higher than your hip. Make sure you are not holding your breath. Hold this position for 30 seconds.  Continuing to keep your abdominal muscles tense and your back steady, slowly return to your starting position. Repeat with the opposite arm and leg. Repeat 2 times. Complete this exercise 3 times per week.   STRENGTHENING - Abdominals and Quadriceps, Straight Leg Raise   Lie on a firm bed or floor with both legs extended in front of you.  Keeping one leg in contact with the floor, bend the other knee so that your foot can rest flat on the floor.  Find your neutral spine, and tense your abdominal muscles to maintain your spinal position throughout the exercise.  Slowly lift your straight leg off the floor about 6 inches for a count of 3, making sure to  not hold your breath.  Still keeping your neutral spine, slowly lower your leg all the way to the floor. Repeat this exercise with each leg 2 times. Complete this exercise 3 times per week.  POSTURE AND BODY MECHANICS CONSIDERATIONS - Low Back Sprain Keeping correct posture when sitting, standing or completing your activities will reduce the stress put on different body tissues, allowing injured tissues a  chance to heal and limiting painful experiences. The following are general guidelines for improved posture.  While reading these guidelines, remember:  The exercises prescribed by your provider will help you have the flexibility and strength to maintain correct postures.  The correct posture provides the best environment for your joints to work. All of your joints have less wear and tear when properly supported by a spine with good posture. This means you will experience a healthier, less painful body.  Correct posture must be practiced with all of your activities, especially prolonged sitting and standing. Correct posture is as important when doing repetitive low-stress activities (typing) as it is when doing a single heavy-load activity (lifting).  RESTING POSITIONS Consider which positions are most painful for you when choosing a resting position. If you have pain with flexion-based activities (sitting, bending, stooping, squatting), choose a position that allows you to rest in a less flexed posture. You would want to avoid curling into a fetal position on your side. If your pain worsens with extension-based activities (prolonged standing, working overhead), avoid resting in an extended position such as sleeping on your stomach. Most people will find more comfort when they rest with their spine in a more neutral position, neither too rounded nor too arched. Lying on a non-sagging bed on your side with a pillow between your knees, or on your back with a pillow under your knees will often provide  some relief. Keep in mind, being in any one position for a prolonged period of time, no matter how correct your posture, can still lead to stiffness.  PROPER SITTING POSTURE In order to minimize stress and discomfort on your spine, you must sit with correct posture. Sitting with good posture should be effortless for a healthy body. Returning to good posture is a gradual process. Many people can work toward this most comfortably by using various supports until they have the flexibility and strength to maintain this posture on their own. When sitting with proper posture, your ears will fall over your shoulders and your shoulders will fall over your hips. You should use the back of the chair to support your upper back. Your lower back will be in a neutral position, just slightly arched. You may place a small pillow or folded towel at the base of your lower back for  support.  When working at a desk, create an environment that supports good, upright posture. Without extra support, muscles tire, which leads to excessive strain on joints and other tissues. Keep these recommendations in mind:  CHAIR:  A chair should be able to slide under your desk when your back makes contact with the back of the chair. This allows you to work closely.  The chair's height should allow your eyes to be level with the upper part of your monitor and your hands to be slightly lower than your elbows.  BODY POSITION  Your feet should make contact with the floor. If this is not possible, use a foot rest.  Keep your ears over your shoulders. This will reduce stress on your neck and low back.  INCORRECT SITTING POSTURES  If you are feeling tired and unable to assume a healthy sitting posture, do not slouch or slump. This puts excessive strain on your back tissues, causing more damage and pain. Healthier options include:  Using more support, like a lumbar pillow.  Switching tasks to something that requires you to be upright  or walking.  Talking a brief walk.  Lying down  to rest in a neutral-spine position.  PROLONGED STANDING WHILE SLIGHTLY LEANING FORWARD  When completing a task that requires you to lean forward while standing in one place for a long time, place either foot up on a stationary 2-4 inch high object to help maintain the best posture. When both feet are on the ground, the lower back tends to lose its slight inward curve. If this curve flattens (or becomes too large), then the back and your other joints will experience too much stress, tire more quickly, and can cause pain.  CORRECT STANDING POSTURES Proper standing posture should be assumed with all daily activities, even if they only take a few moments, like when brushing your teeth. As in sitting, your ears should fall over your shoulders and your shoulders should fall over your hips. You should keep a slight tension in your abdominal muscles to brace your spine. Your tailbone should point down to the ground, not behind your body, resulting in an over-extended swayback posture.   INCORRECT STANDING POSTURES  Common incorrect standing postures include a forward head, locked knees and/or an excessive swayback. WALKING Walk with an upright posture. Your ears, shoulders and hips should all line-up.  PROLONGED ACTIVITY IN A FLEXED POSITION When completing a task that requires you to bend forward at your waist or lean over a low surface, try to find a way to stabilize 3 out of 4 of your limbs. You can place a hand or elbow on your thigh or rest a knee on the surface you are reaching across. This will provide you more stability, so that your muscles do not tire as quickly. By keeping your knees relaxed, or slightly bent, you will also reduce stress across your lower back. CORRECT LIFTING TECHNIQUES  DO :  Assume a wide stance. This will provide you more stability and the opportunity to get as close as possible to the object which you are  lifting.  Tense your abdominals to brace your spine. Bend at the knees and hips. Keeping your back locked in a neutral-spine position, lift using your leg muscles. Lift with your legs, keeping your back straight.  Test the weight of unknown objects before attempting to lift them.  Try to keep your elbows locked down at your sides in order get the best strength from your shoulders when carrying an object.     Always ask for help when lifting heavy or awkward objects. INCORRECT LIFTING TECHNIQUES DO NOT:   Lock your knees when lifting, even if it is a small object.  Bend and twist. Pivot at your feet or move your feet when needing to change directions.  Assume that you can safely pick up even a paperclip without proper posture.

## 2020-07-16 ENCOUNTER — Other Ambulatory Visit (HOSPITAL_BASED_OUTPATIENT_CLINIC_OR_DEPARTMENT_OTHER): Payer: Self-pay

## 2020-07-16 ENCOUNTER — Other Ambulatory Visit: Payer: Self-pay

## 2020-07-16 ENCOUNTER — Encounter: Payer: Self-pay | Admitting: Family Medicine

## 2020-07-16 ENCOUNTER — Ambulatory Visit (INDEPENDENT_AMBULATORY_CARE_PROVIDER_SITE_OTHER): Payer: 59 | Admitting: Family Medicine

## 2020-07-16 ENCOUNTER — Telehealth: Payer: Self-pay | Admitting: Family Medicine

## 2020-07-16 VITALS — BP 152/98 | HR 102 | Temp 97.9°F | Resp 18 | Ht 74.0 in | Wt 306.0 lb

## 2020-07-16 DIAGNOSIS — Z125 Encounter for screening for malignant neoplasm of prostate: Secondary | ICD-10-CM | POA: Diagnosis not present

## 2020-07-16 DIAGNOSIS — R7303 Prediabetes: Secondary | ICD-10-CM | POA: Diagnosis not present

## 2020-07-16 DIAGNOSIS — L280 Lichen simplex chronicus: Secondary | ICD-10-CM | POA: Diagnosis not present

## 2020-07-16 DIAGNOSIS — Z0000 Encounter for general adult medical examination without abnormal findings: Secondary | ICD-10-CM | POA: Diagnosis not present

## 2020-07-16 DIAGNOSIS — I10 Essential (primary) hypertension: Secondary | ICD-10-CM

## 2020-07-16 LAB — COMPREHENSIVE METABOLIC PANEL
ALT: 16 U/L (ref 0–53)
AST: 15 U/L (ref 0–37)
Albumin: 3.9 g/dL (ref 3.5–5.2)
Alkaline Phosphatase: 108 U/L (ref 39–117)
BUN: 14 mg/dL (ref 6–23)
CO2: 30 mEq/L (ref 19–32)
Calcium: 9.2 mg/dL (ref 8.4–10.5)
Chloride: 100 mEq/L (ref 96–112)
Creatinine, Ser: 0.92 mg/dL (ref 0.40–1.50)
GFR: 90.24 mL/min (ref >60.00)
Glucose, Bld: 95 mg/dL (ref 70–99)
Potassium: 4 mEq/L (ref 3.5–5.1)
Sodium: 138 mEq/L (ref 135–145)
Total Bilirubin: 0.6 mg/dL (ref 0.2–1.2)
Total Protein: 7.5 g/dL (ref 6.0–8.3)

## 2020-07-16 LAB — CBC
HCT: 42.7 % (ref 39.0–52.0)
Hemoglobin: 14 g/dL (ref 13.0–17.0)
MCHC: 32.8 g/dL (ref 30.0–36.0)
MCV: 90.4 fl (ref 78.0–100.0)
Platelets: 274 10*3/uL (ref 150.0–400.0)
RBC: 4.73 Mil/uL (ref 4.22–5.81)
RDW: 16.4 % — ABNORMAL HIGH (ref 11.5–15.5)
WBC: 7.3 10*3/uL (ref 4.0–10.5)

## 2020-07-16 LAB — PSA: PSA: 0.01 ng/mL — ABNORMAL LOW (ref 0.10–4.00)

## 2020-07-16 LAB — LIPID PANEL
Cholesterol: 200 mg/dL (ref 0–200)
HDL: 92.8 mg/dL (ref >39.00)
LDL Cholesterol: 98 mg/dL (ref 0–99)
NonHDL: 106.9
Total CHOL/HDL Ratio: 2
Triglycerides: 45 mg/dL (ref 0.0–149.0)
VLDL: 9 mg/dL (ref 0.0–40.0)

## 2020-07-16 LAB — HEMOGLOBIN A1C: Hgb A1c MFr Bld: 6.3 % (ref 4.6–6.5)

## 2020-07-16 MED ORDER — AMLODIPINE BESYLATE 5 MG PO TABS: 5.0000 mg | ORAL_TABLET | Freq: Every day | ORAL | 3 refills | Status: DC

## 2020-07-16 MED ORDER — AMLODIPINE BESYLATE 5 MG PO TABS
5.0000 mg | ORAL_TABLET | Freq: Every day | ORAL | 3 refills | Status: DC
  Filled 2020-07-16: qty 30, 30d supply, fill #0

## 2020-07-16 MED ORDER — TRIAMCINOLONE ACETONIDE 0.5 % EX CREA
1.0000 | TOPICAL_CREAM | Freq: Two times a day (BID) | CUTANEOUS | 0 refills | Status: DC
Start: 2020-07-16 — End: 2020-07-16

## 2020-07-16 MED ORDER — TRIAMCINOLONE ACETONIDE 0.5 % EX CREA
1.0000 "application " | TOPICAL_CREAM | Freq: Two times a day (BID) | CUTANEOUS | 0 refills | Status: DC
  Filled 2020-07-16: qty 15, 14d supply, fill #0

## 2020-07-16 NOTE — Telephone Encounter (Signed)
Sent letter to patient in mail as requested.

## 2020-07-16 NOTE — Patient Instructions (Addendum)
Give [place] 2-3 business days to get the results of your labs back.   Keep the diet clean and stay active.  The new Shingrix vaccine (for shingles) is a 2 shot series. It can make people feel low energy, achy and almost like they have the flu for 48 hours after injection. Please plan accordingly when deciding on when to get this shot. Call our office for a nurse visit appointment to get this. The second shot of the series is less severe regarding the side effects, but it still lasts 48 hours.   Try not to itch.   Ice/cold pack over area for 10-15 min twice daily.  Let us know if you need anything.

## 2020-07-16 NOTE — Progress Notes (Signed)
Chief Complaint  Patient presents with  . Annual Exam    physical    Well Male Peter Camacho is here for a complete physical.   His last physical was >1 year ago.  Current diet: in general, a "healthy" diet.  Current exercise: active at work, walking Weight trend: intentionally losing Fatigue out of ordinary? No. Seat belt? Yes.    Health maintenance Shingrix- No Colonoscopy- Yes Tetanus- Yes HIV- Yes Hep C- Yes   Hypertension Patient presents for hypertension follow up. He does not routinely monitor home blood pressures. He is compliant with medications- Zestoretic 20-25 mg/d. Marland Kitchen Patient has these side effects of medication: none Diet/exercise as above   Skin Thinks he was exposed to chemicals at work. Itchy area on RLE. It is dark and won't go away. Has not tried anything at home. No pain, fevers, drainage.    Past Medical History:  Diagnosis Date  . Allergy   . Asthma   . At risk for sleep apnea    STOP-BANG= 5    SENT TO PCP 03-17-2014  . GI bleed   . Hard of hearing   . History of seizure    HX menigitis x2  in middle and high school - residual seizure's on medications--  last seizure age 33  . Hypertension   . Obesity   . Personal history of meningitis    X2  MIDDLE AND HIGH SCHOOL--  residual seizure's on medication -- last seizure age 29  . Prostate cancer ([place]) MONITORED BY DR OTTELIN   stage T1c, Gleason 3+4, PSA 12.79, vol 38.5cc      Past Surgical History:  Procedure Laterality Date  . ESOPHAGOGASTRODUODENOSCOPY (EGD) WITH PROPOFOL N/A 08/10/2019   Procedure: ESOPHAGOGASTRODUODENOSCOPY (EGD) WITH PROPOFOL;  Surgeon: Ladene Artist, MD;  Location: [facility];  Service: Endoscopy;  Laterality: N/A;  . HEMOSTASIS CONTROL  08/10/2019   Procedure: HEMOSTASIS CONTROL;  Surgeon: Ladene Artist, MD;  Location: [facility];  Service: Endoscopy;;  . HOT HEMOSTASIS N/A 08/10/2019   Procedure: HOT HEMOSTASIS (ARGON PLASMA COAGULATION/BICAP);  Surgeon:  Ladene Artist, MD;  Location: [facility];  Service: Endoscopy;  Laterality: N/A;  . PROSTATE BIOPSY    . RADIOACTIVE SEED IMPLANT N/A 03/20/2014   Procedure: RADIOACTIVE SEED IMPLANT;  Surgeon: Claybon Jabs, MD;  Location: [facility];  Service: Urology;  Laterality: N/A;  . SCLEROTHERAPY  08/10/2019   Procedure: SCLEROTHERAPY;  Surgeon: Ladene Artist, MD;  Location: [facility];  Service: Endoscopy;;    Medications  Current Outpatient Medications on File Prior to Visit  Medication Sig Dispense Refill  . acetaminophen (TYLENOL) 500 MG tablet Take 500 mg by mouth every 6 (six) hours as needed for mild pain, moderate pain, fever or headache.    . albuterol (VENTOLIN HFA) 108 (90 Base) MCG/ACT inhaler INHALE 2 PUFFS INTO THE LUNGS EVERY 6 HOURS AS NEEDED FOR WHEEZING OR SHORTNESS OF BREATH 8.5 each 1  . Cholecalciferol (VITAMIN D3 PO) Take 1 tablet by mouth daily.    Marland Kitchen lisinopril-hydrochlorothiazide (ZESTORETIC) 20-25 MG tablet TAKE 1 TABLET BY MOUTH EVERY DAY 90 tablet 1  . meloxicam (MOBIC) 7.5 MG tablet Take 1 tablet (7.5 mg total) by mouth daily. 30 tablet 0  . Multiple Vitamin (MULTIVITAMIN WITH MINERALS) TABS tablet Take 1 tablet by mouth daily.    . Psyllium (METAMUCIL PO) Take by mouth.    . Sod Picosulfate-Mag Ox-Cit Acd (CLENPIQ) 10-3.5-12 MG-GM -GM/160ML SOLN Take 1 kit by  mouth as directed. 320 mL 0  . SSD 1 % cream APPLY 1 APPLICATION TOPICALLY DAILY. 50 g 0  . pantoprazole (PROTONIX) 40 MG tablet Take 1 tablet (40 mg total) by mouth daily. 30 tablet 2    Allergies Allergies  Allergen Reactions  . Fish-Derived Products Rash    Family History Family History  Problem Relation Age of Onset  . CAD Mother 75  . Diabetes Mother   . CAD Father 66  . Cancer Neg Hx     Review of Systems: Constitutional:  no fevers Eye:  no recent significant change in vision Ear/Nose/Mouth/Throat:  Ears:  no new hearing loss Nose/Mouth/Throat:  no complaints of nasal  congestion, no sore throat Cardiovascular:  no chest pain Respiratory:  no shortness of breath Gastrointestinal:  no change in bowel habits GU:  Male: negative for dysuria, frequency Musculoskeletal/Extremities:  No new joint pain Integumentary (Skin/Breast): _+itchy patch on RLE Neurologic:  no headaches Endocrine: No unexpected weight changes Hematologic/Lymphatic:  no abnormal bleeding  Exam BP (!) 152/98 (BP Location: Left Arm, Patient Position: Sitting, Cuff Size: Normal)   Pulse (!) 102   Temp 97.9 F (36.6 C)   Resp 18   Ht '6\' 2"'  (1.88 m)   Wt (!) 306 lb (138.8 kg)   SpO2 97%   BMI 39.29 kg/m  General:  well developed, well nourished, in no apparent distress Skin: +Hyperpigmented patch on lateral RLE. No ttp, scaling, fluctuance, warmth, induration, erythema. Otherwise no significant moles, warts, or growths Head:  no masses, lesions, or tenderness Eyes:  pupils equal and round, sclera anicteric without injection Ears:  canals without lesions, TMs shiny without retraction, no obvious effusion, no erythema Nose:  nares patent, septum midline, mucosa normal Throat/Pharynx:  lips and gingiva without lesion; tongue and uvula midline; non-inflamed pharynx; no exudates or postnasal drainage Neck: neck supple without adenopathy, thyromegaly, or masses Cardiac: RRR, no bruits, no LE edema Lungs:  clear to auscultation, breath sounds equal bilaterally, no respiratory distress Abdomen: BS+, soft, non-tender, non-distended, no masses or organomegaly noted Rectal: Deferred Musculoskeletal:  symmetrical muscle groups noted without atrophy or deformity Neuro:  gait normal; deep tendon reflexes normal and symmetric Psych: well oriented with normal range of affect and appropriate judgment/insight  Assessment and Plan  Well adult exam - Plan: Lipid panel  Essential hypertension - Plan: CBC, Comprehensive metabolic panel, amLODipine (NORVASC) 5 MG tablet  Prediabetes - Plan:  Hemoglobin N8M  LSC (lichen simplex chronicus) - Plan: triamcinolone cream (KENALOG) 0.5 %, DISCONTINUED: triamcinolone cream (KENALOG) 0.5 %  Screening PSA (prostate specific antigen) - Plan: PSA   Well 60 y.o. male. Counseled on diet and exercise. Counseled on risks and benefits of prostate cancer screening with PSA. The patient agrees to undergo testing. Immunizations, labs, and further orders as above. HTN: Chronic, uncontrolled. Add Norvasc 5 mg/d. Cont Zestoretic 20-25 mg/d. Monitor BP at home. F/u in 1 mo. LSC: Higher potency steroid, avoid itching. Biopsy vs derm referral if no better.  The patient voiced understanding and agreement to the plan.  Orr, DO 07/16/20 8:57 AM

## 2020-07-16 NOTE — Telephone Encounter (Signed)
Patient needs a doctors note for today. Patient stated he was given another patient note. Patient would like note to be mail

## 2020-08-15 ENCOUNTER — Other Ambulatory Visit: Payer: Self-pay | Admitting: Family Medicine

## 2020-08-15 DIAGNOSIS — J302 Other seasonal allergic rhinitis: Secondary | ICD-10-CM

## 2020-08-17 ENCOUNTER — Ambulatory Visit (INDEPENDENT_AMBULATORY_CARE_PROVIDER_SITE_OTHER): Payer: 59 | Admitting: Family Medicine

## 2020-08-17 ENCOUNTER — Other Ambulatory Visit: Payer: Self-pay | Admitting: Family Medicine

## 2020-08-17 ENCOUNTER — Encounter: Payer: Self-pay | Admitting: Family Medicine

## 2020-08-17 ENCOUNTER — Other Ambulatory Visit: Payer: Self-pay

## 2020-08-17 ENCOUNTER — Other Ambulatory Visit (HOSPITAL_BASED_OUTPATIENT_CLINIC_OR_DEPARTMENT_OTHER): Payer: Self-pay

## 2020-08-17 VITALS — BP 120/80 | HR 84 | Temp 98.2°F | Ht 74.0 in | Wt 305.2 lb

## 2020-08-17 DIAGNOSIS — I10 Essential (primary) hypertension: Secondary | ICD-10-CM | POA: Diagnosis not present

## 2020-08-17 DIAGNOSIS — M545 Low back pain, unspecified: Secondary | ICD-10-CM

## 2020-08-17 DIAGNOSIS — L280 Lichen simplex chronicus: Secondary | ICD-10-CM | POA: Diagnosis not present

## 2020-08-17 MED ORDER — MELOXICAM 7.5 MG PO TABS
7.5000 mg | ORAL_TABLET | Freq: Every day | ORAL | 0 refills | Status: DC
  Filled 2020-08-17: qty 30, 30d supply, fill #0

## 2020-08-17 MED ORDER — AMLODIPINE BESYLATE 5 MG PO TABS
5.0000 mg | ORAL_TABLET | Freq: Every day | ORAL | 2 refills | Status: DC
  Filled 2020-08-17 (×2): qty 30, 30d supply, fill #0

## 2020-08-17 MED ORDER — TRIAMCINOLONE ACETONIDE 0.5 % EX CREA
1.0000 "application " | TOPICAL_CREAM | Freq: Two times a day (BID) | CUTANEOUS | 0 refills | Status: DC
  Filled 2020-08-17: qty 15, 15d supply, fill #0

## 2020-08-17 NOTE — Progress Notes (Signed)
Chief Complaint  Patient presents with   Follow-up    Subjective Peter Camacho is a 59 y.o. male who presents for hypertension follow up. He does not routinely monitor home blood pressures. He is compliant with medications- Zestoretic 20-25 mg/d, Norvasc 5 mg/d started 1 mo ago. Patient has these side effects of medication: none He is sometimes adhering to a healthy diet overall. Current exercise: walking No CP or SOB.   LSC Hx of [place] on anterior right leg. Itchy. No pain or drainage, no new topicals. He used Kenalog cream in past that helped, but he continues to itch. It is dark, no redness and it is not spreading out.    Past Medical History:  Diagnosis Date   Allergy    Asthma    At risk for sleep apnea    STOP-BANG= 5    SENT TO PCP 03-17-2014   GI bleed    Hard of hearing    History of seizure    HX menigitis x2  in middle and high school - residual seizure's on medications--  last seizure age 15   Hypertension    Obesity    Personal history of meningitis    X2  MIDDLE AND HIGH SCHOOL--  residual seizure's on medication -- last seizure age 38   Prostate cancer ([place]) MONITORED BY DR OTTELIN   stage T1c, Gleason 3+4, PSA 12.79, vol 38.5cc    Exam BP 120/80   Pulse 100   Temp 98.2 F (36.8 C) (Oral)   Ht 6\' 2"  (1.88 m)   Wt (!) 305 lb 4 oz (138.5 kg)   SpO2 94%   BMI 39.19 kg/m  General:  well developed, well nourished, in no apparent distress Ears: HOH Heart: RRR, no bruits, no LE edema Lungs: clear to auscultation, no accessory muscle use Skin: Hyperpigmented patch on anterior RLE without erythema, excessive warmth, drainage, fluctuance, ttp.  Psych: well oriented with normal range of affect and appropriate judgment/insight  Essential hypertension  Morbid obesity ([place]), Chronic  Chronic, stable. Cont Norvasc 5 mg/d. Zestoretic 20-25 mg/d. Counseled on diet and exercise. Chronic, unstable. Cont Kenalog 0.5% bid prn. Needs to stop the itching cycle. I want  to see him in 1 mo if no better and will biopsy vs refer to derm.  F/u in 6 mo for med ck otherwise. The patient voiced understanding and agreement to the plan.  [place], DO 08/17/20  8:08 AM

## 2020-08-17 NOTE — Patient Instructions (Addendum)
Keep the diet clean and stay active.  Aim to do some physical exertion for 150 minutes per week. This is typically divided into 5 days per week, 30 minutes per day. The activity should be enough to get your heart rate up. Anything is better than nothing if you have time constraints.  Consider getting a memory foam mattress pad as this may be a cheaper option.   Try not to itch.  Avoid scented products.   Cold can be helpful.   If the skin is not better in the next month, I want to see you.   Let us know if you need anything.

## 2020-10-02 ENCOUNTER — Other Ambulatory Visit: Payer: Self-pay

## 2020-10-02 ENCOUNTER — Encounter (HOSPITAL_BASED_OUTPATIENT_CLINIC_OR_DEPARTMENT_OTHER): Payer: Self-pay

## 2020-10-02 ENCOUNTER — Emergency Department (HOSPITAL_BASED_OUTPATIENT_CLINIC_OR_DEPARTMENT_OTHER)
Admission: EM | Admit: 2020-10-02 | Discharge: 2020-10-02 | Disposition: A | Discharge status: Home / Self Care | Payer: 59 | Attending: Emergency Medicine | Admitting: Emergency Medicine

## 2020-10-02 DIAGNOSIS — J45909 Unspecified asthma, uncomplicated: Secondary | ICD-10-CM | POA: Diagnosis not present

## 2020-10-02 DIAGNOSIS — L089 Local infection of the skin and subcutaneous tissue, unspecified: Secondary | ICD-10-CM | POA: Insufficient documentation

## 2020-10-02 DIAGNOSIS — I10 Essential (primary) hypertension: Secondary | ICD-10-CM | POA: Insufficient documentation

## 2020-10-02 DIAGNOSIS — Z8546 Personal history of malignant neoplasm of prostate: Secondary | ICD-10-CM | POA: Insufficient documentation

## 2020-10-02 DIAGNOSIS — Z79899 Other long term (current) drug therapy: Secondary | ICD-10-CM | POA: Diagnosis not present

## 2020-10-02 DIAGNOSIS — Z87891 Personal history of nicotine dependence: Secondary | ICD-10-CM | POA: Insufficient documentation

## 2020-10-02 DIAGNOSIS — R2231 Localized swelling, mass and lump, right upper limb: Secondary | ICD-10-CM | POA: Diagnosis present

## 2020-10-02 MED ORDER — AMOXICILLIN-POT CLAVULANATE 875-125 MG PO TABS
1.0000 | ORAL_TABLET | Freq: Once | ORAL | Status: AC
  Administered 2020-10-02: 1 via ORAL
  Filled 2020-10-02: qty 1

## 2020-10-02 MED ORDER — AMOXICILLIN-POT CLAVULANATE 875-125 MG PO TABS: 1.0000 | ORAL_TABLET | Freq: Two times a day (BID) | ORAL | 0 refills | Status: DC

## 2020-10-02 NOTE — ED Triage Notes (Signed)
PT c/o right 3rd finger swelling. States swelling yesterday. Denies known injury, states may have scratched it at work, and also picked a callous off of hand.

## 2020-10-02 NOTE — ED Provider Notes (Signed)
[facility] EMERGENCY DEPARTMENT Provider Note  CSN: [id number] Arrival date & time: 10/02/20 1235    History Chief Complaint  Patient presents with   Hand Pain    Peter Camacho is a 60 y.o. male reports swelling of R third finger since yesterday, thinks he may have scratched it at work. No fever. Some pain with movement.    Past Medical History:  Diagnosis Date   Allergy    Asthma    At risk for sleep apnea    STOP-BANG= 5    SENT TO PCP 03-17-2014   GI bleed    Hard of hearing    History of seizure    HX menigitis x2  in middle and high school - residual seizure's on medications--  last seizure age 46   Hypertension    Obesity    Personal history of meningitis    X2  MIDDLE AND HIGH SCHOOL--  residual seizure's on medication -- last seizure age 58   Prostate cancer ([place]) MONITORED BY DR OTTELIN   stage T1c, Gleason 3+4, PSA 12.79, vol 38.5cc    Past Surgical History:  Procedure Laterality Date   ESOPHAGOGASTRODUODENOSCOPY (EGD) WITH PROPOFOL N/A 08/10/2019   Procedure: ESOPHAGOGASTRODUODENOSCOPY (EGD) WITH PROPOFOL;  Surgeon: Ladene Artist, MD;  Location: [facility];  Service: Endoscopy;  Laterality: N/A;   HEMOSTASIS CONTROL  08/10/2019   Procedure: HEMOSTASIS CONTROL;  Surgeon: Ladene Artist, MD;  Location: [facility];  Service: Endoscopy;;   HOT HEMOSTASIS N/A 08/10/2019   Procedure: HOT HEMOSTASIS (ARGON PLASMA COAGULATION/BICAP);  Surgeon: Ladene Artist, MD;  Location: [facility];  Service: Endoscopy;  Laterality: N/A;   PROSTATE BIOPSY     RADIOACTIVE SEED IMPLANT N/A 03/20/2014   Procedure: RADIOACTIVE SEED IMPLANT;  Surgeon: Claybon Jabs, MD;  Location: [facility];  Service: Urology;  Laterality: N/A;   SCLEROTHERAPY  08/10/2019   Procedure: SCLEROTHERAPY;  Surgeon: Ladene Artist, MD;  Location: [facility];  Service: Endoscopy;;    Family History  Problem Relation Age of Onset   CAD Mother 35   Diabetes Mother     CAD Father 58   Cancer Neg Hx     Social History   Tobacco Use   Smoking status: Former    Packs/day: 0.30    Years: 40.00    Pack years: 12.00    Types: Cigarettes   Smokeless tobacco: Never   Tobacco comments:    quit 11/11/16  Vaping Use   Vaping Use: Never used  Substance Use Topics   Alcohol use: No   Drug use: No     Home Medications Prior to Admission medications   Medication Sig Start Date End Date Taking? Authorizing Provider  amoxicillin-clavulanate (AUGMENTIN) 875-125 MG tablet Take 1 tablet by mouth every 12 (twelve) hours. 10/02/20  Yes Truddie Hidden, MD  acetaminophen (TYLENOL) 500 MG tablet Take 500 mg by mouth every 6 (six) hours as needed for mild pain, moderate pain, fever or headache.    [provider]  albuterol (VENTOLIN HFA) 108 (90 Base) MCG/ACT inhaler INHALE 2 PUFFS INTO THE LUNGS EVERY 6 HOURS AS NEEDED FOR WHEEZING OR SHORTNESS OF BREATH 08/16/20   Wendling, [name], DO  amLODipine (NORVASC) 5 MG tablet Take 1 tablet (5 mg total) by mouth daily. 08/17/20   [name], DO  Cholecalciferol (VITAMIN D3 PO) Take 1 tablet by mouth daily.    [provider]  lisinopril-hydrochlorothiazide (ZESTORETIC) 20-25 MG  tablet TAKE 1 TABLET BY MOUTH EVERY DAY 02/09/20   Wendling, [name], DO  meloxicam (MOBIC) 7.5 MG tablet Take 1 tablet (7.5 mg total) by mouth daily. 08/17/20   [name], DO  Multiple Vitamin (MULTIVITAMIN WITH MINERALS) TABS tablet Take 1 tablet by mouth daily.    [provider]  Psyllium (METAMUCIL PO) Take by mouth.    [provider]  Sod Picosulfate-Mag Ox-Cit Acd (CLENPIQ) 10-3.5-12 MG-GM -GM/160ML SOLN Take 1 kit by mouth as directed. 10/15/19   Cirigliano, Vito V, DO  SSD 1 % cream APPLY 1 APPLICATION TOPICALLY DAILY. 08/15/19   [name], DO  triamcinolone cream (KENALOG) 0.5 % Apply 1 application on to the skin 2 (two) times daily. 08/17/20   [name], DO     Allergies    Fish-derived products   Review of Systems   Review of Systems A comprehensive review of systems was completed and negative except as noted in HPI.    Physical Exam BP (!) 144/113 (BP Location: Left Arm)   Pulse (!) 106   Temp 98.1 F (36.7 C) (Oral)   Resp 18   Ht _0  (1.88 m)   Wt (!) 139 kg   SpO2 96%   BMI 39.34 kg/m   Physical Exam Vitals and nursing note reviewed.  HENT:     Head: Normocephalic.     Nose: Nose normal.  Eyes:     Extraocular Movements: Extraocular movements intact.  Pulmonary:     Effort: Pulmonary effort is normal.  Musculoskeletal:        General: Normal range of motion.     Cervical back: Neck supple.     Comments: Mild swelling over palmar R 3rd proximal phalanx, he is able to flex and extend without difficulty, not held in flexion, no proximal streaking  Skin:    Findings: No rash (on exposed skin).  Neurological:     Mental Status: He is alert and oriented to person, place, and time.  Psychiatric:        Mood and Affect: Mood normal.      ED Results / Procedures / Treatments   Labs (all labs ordered are listed, but only abnormal results are displayed) Labs Reviewed - No data to display  EKG None   Radiology No results found.  Procedures Procedures  Medications Ordered in the ED Medications  amoxicillin-clavulanate (AUGMENTIN) 875-125 MG per tablet 1 tablet (has no administration in time range)     MDM Rules/Calculators/A&P MDM Patient with swelling of R third finger, will treat with Abx for possible infection. Does not appear to be a significant flexor tenosynovitis by exam however patient was cautioned that if pain and/or swelling worsens he should go to [facility] where a Hand Surgeon would be able to see him.   ED Course  I have reviewed the triage vital signs and the nursing notes.  Pertinent labs & imaging results that were available during my care of the patient were reviewed by  me and considered in my medical decision making (see chart for details).     Final Clinical Impression(s) / ED Diagnoses Final diagnoses:  Finger infection    Rx / DC Orders ED Discharge Orders          Ordered    amoxicillin-clavulanate (AUGMENTIN) 875-125 MG tablet  Every 12 hours        10/02/20 1341             Karle Starch,  Mercie Eon, MD 10/02/20 1341

## 2020-10-12 ENCOUNTER — Other Ambulatory Visit: Payer: Self-pay | Admitting: Family Medicine

## 2020-10-12 DIAGNOSIS — J302 Other seasonal allergic rhinitis: Secondary | ICD-10-CM

## 2020-10-16 ENCOUNTER — Other Ambulatory Visit: Payer: Self-pay

## 2020-10-16 ENCOUNTER — Observation Stay (HOSPITAL_BASED_OUTPATIENT_CLINIC_OR_DEPARTMENT_OTHER)
Admission: EM | Admit: 2020-10-16 | Discharge: 2020-10-17 | Disposition: A | Discharge status: Home / Self Care | Payer: 59 | Attending: Internal Medicine | Admitting: Internal Medicine

## 2020-10-16 ENCOUNTER — Encounter (HOSPITAL_BASED_OUTPATIENT_CLINIC_OR_DEPARTMENT_OTHER): Payer: Self-pay | Admitting: Emergency Medicine

## 2020-10-16 ENCOUNTER — Emergency Department (HOSPITAL_BASED_OUTPATIENT_CLINIC_OR_DEPARTMENT_OTHER): Payer: 59

## 2020-10-16 DIAGNOSIS — Z20822 Contact with and (suspected) exposure to covid-19: Secondary | ICD-10-CM | POA: Insufficient documentation

## 2020-10-16 DIAGNOSIS — Z79899 Other long term (current) drug therapy: Secondary | ICD-10-CM | POA: Insufficient documentation

## 2020-10-16 DIAGNOSIS — J45909 Unspecified asthma, uncomplicated: Secondary | ICD-10-CM | POA: Diagnosis not present

## 2020-10-16 DIAGNOSIS — T7840XA Allergy, unspecified, initial encounter: Principal | ICD-10-CM | POA: Insufficient documentation

## 2020-10-16 DIAGNOSIS — F17200 Nicotine dependence, unspecified, uncomplicated: Secondary | ICD-10-CM | POA: Diagnosis present

## 2020-10-16 DIAGNOSIS — I10 Essential (primary) hypertension: Secondary | ICD-10-CM | POA: Diagnosis not present

## 2020-10-16 DIAGNOSIS — Z8546 Personal history of malignant neoplasm of prostate: Secondary | ICD-10-CM | POA: Insufficient documentation

## 2020-10-16 DIAGNOSIS — T783XXA Angioneurotic edema, initial encounter: Secondary | ICD-10-CM

## 2020-10-16 DIAGNOSIS — T782XXA Anaphylactic shock, unspecified, initial encounter: Secondary | ICD-10-CM | POA: Diagnosis present

## 2020-10-16 DIAGNOSIS — E876 Hypokalemia: Secondary | ICD-10-CM

## 2020-10-16 DIAGNOSIS — Z87891 Personal history of nicotine dependence: Secondary | ICD-10-CM | POA: Insufficient documentation

## 2020-10-16 DIAGNOSIS — T464X5A Adverse effect of angiotensin-converting-enzyme inhibitors, initial encounter: Secondary | ICD-10-CM | POA: Diagnosis present

## 2020-10-16 LAB — BASIC METABOLIC PANEL
Anion gap: 10 (ref 5–15)
BUN: 25 mg/dL — ABNORMAL HIGH (ref 8–23)
CO2: 25 mmol/L (ref 22–32)
Calcium: 8.9 mg/dL (ref 8.9–10.3)
Chloride: 99 mmol/L (ref 98–111)
Creatinine, Ser: 1.17 mg/dL (ref 0.61–1.24)
GFR, Estimated: >60 mL/min (ref >60)
Glucose, Bld: 113 mg/dL — ABNORMAL HIGH (ref 70–99)
Potassium: 3.4 mmol/L — ABNORMAL LOW (ref 3.5–5.1)
Sodium: 134 mmol/L — ABNORMAL LOW (ref 135–145)

## 2020-10-16 LAB — CBC WITH DIFFERENTIAL/PLATELET
Abs Immature Granulocytes: 0.04 10*3/uL (ref 0.00–0.07)
Basophils Absolute: 0 10*3/uL (ref 0.0–0.1)
Basophils Relative: 0 %
Eosinophils Absolute: 0 10*3/uL (ref 0.0–0.5)
Eosinophils Relative: 0 %
HCT: 43.1 % (ref 39.0–52.0)
Hemoglobin: 14.9 g/dL (ref 13.0–17.0)
Immature Granulocytes: 0 %
Lymphocytes Relative: 13 %
Lymphs Abs: 1.9 10*3/uL (ref 0.7–4.0)
MCH: 30.5 pg (ref 26.0–34.0)
MCHC: 34.6 g/dL (ref 30.0–36.0)
MCV: 88.1 fL (ref 80.0–100.0)
Monocytes Absolute: 1.2 10*3/uL — ABNORMAL HIGH (ref 0.1–1.0)
Monocytes Relative: 8 %
Neutro Abs: 11.6 10*3/uL — ABNORMAL HIGH (ref 1.7–7.7)
Neutrophils Relative %: 79 %
Platelets: 336 10*3/uL (ref 150–400)
RBC: 4.89 MIL/uL (ref 4.22–5.81)
RDW: 15.5 % (ref 11.5–15.5)
WBC: 14.8 10*3/uL — ABNORMAL HIGH (ref 4.0–10.5)
nRBC: 0 % (ref 0.0–0.2)

## 2020-10-16 LAB — RESP PANEL BY RT-PCR (FLU A&B, COVID) ARPGX2
Influenza A by PCR: NEGATIVE
Influenza B by PCR: NEGATIVE
SARS Coronavirus 2 by RT PCR: NEGATIVE

## 2020-10-16 MED ORDER — ALBUTEROL SULFATE (2.5 MG/3ML) 0.083% IN NEBU: 2.5000 mg | INHALATION_SOLUTION | RESPIRATORY_TRACT | Status: DC | PRN

## 2020-10-16 MED ORDER — SODIUM CHLORIDE 0.9 % IV SOLN
75.0000 mL/h | INTRAVENOUS | Status: AC
  Administered 2020-10-17: 75 mL/h via INTRAVENOUS

## 2020-10-16 MED ORDER — FAMOTIDINE IN NACL 20-0.9 MG/50ML-% IV SOLN
20.0000 mg | Freq: Once | INTRAVENOUS | Status: AC
  Administered 2020-10-16: 20 mg via INTRAVENOUS
  Filled 2020-10-16: qty 50

## 2020-10-16 MED ORDER — FAMOTIDINE IN NACL 20-0.9 MG/50ML-% IV SOLN
20.0000 mg | Freq: Two times a day (BID) | INTRAVENOUS | Status: DC
  Administered 2020-10-17: 20 mg via INTRAVENOUS
  Filled 2020-10-16: qty 50

## 2020-10-16 MED ORDER — SODIUM CHLORIDE 0.9 % IV SOLN
INTRAVENOUS | Status: DC
Start: 2020-10-16 — End: 2020-10-17

## 2020-10-16 MED ORDER — ONDANSETRON HCL 4 MG/2ML IJ SOLN
4.0000 mg | Freq: Once | INTRAMUSCULAR | Status: AC
  Administered 2020-10-16: 4 mg via INTRAVENOUS
  Filled 2020-10-16: qty 2

## 2020-10-16 MED ORDER — ACETAMINOPHEN 325 MG PO TABS: 650.0000 mg | ORAL_TABLET | Freq: Four times a day (QID) | ORAL | Status: DC | PRN

## 2020-10-16 MED ORDER — SODIUM CHLORIDE 0.9 % IV BOLUS
1000.0000 mL | Freq: Once | INTRAVENOUS | Status: AC
  Administered 2020-10-16: 1000 mL via INTRAVENOUS

## 2020-10-16 MED ORDER — ACETAMINOPHEN 650 MG RE SUPP: 650.0000 mg | Freq: Four times a day (QID) | RECTAL | Status: DC | PRN

## 2020-10-16 MED ORDER — EPINEPHRINE 0.3 MG/0.3ML IJ SOAJ
0.3000 mg | Freq: Once | INTRAMUSCULAR | Status: AC
  Administered 2020-10-16: 0.3 mg via INTRAMUSCULAR
  Filled 2020-10-16: qty 0.3

## 2020-10-16 MED ORDER — METHYLPREDNISOLONE SODIUM SUCC 125 MG IJ SOLR
60.0000 mg | Freq: Two times a day (BID) | INTRAMUSCULAR | Status: DC
  Administered 2020-10-17: 60 mg via INTRAVENOUS
  Filled 2020-10-16: qty 2

## 2020-10-16 MED ORDER — METHYLPREDNISOLONE SODIUM SUCC 125 MG IJ SOLR
125.0000 mg | Freq: Once | INTRAMUSCULAR | Status: AC
  Administered 2020-10-16: 125 mg via INTRAVENOUS
  Filled 2020-10-16: qty 2

## 2020-10-16 MED ORDER — DIPHENHYDRAMINE HCL 50 MG/ML IJ SOLN
25.0000 mg | Freq: Once | INTRAMUSCULAR | Status: AC
  Administered 2020-10-16: 25 mg via INTRAVENOUS
  Filled 2020-10-16: qty 1

## 2020-10-16 MED ORDER — DIPHENHYDRAMINE HCL 50 MG/ML IJ SOLN
25.0000 mg | Freq: Four times a day (QID) | INTRAMUSCULAR | Status: DC | PRN
  Administered 2020-10-17: 25 mg via INTRAVENOUS
  Filled 2020-10-16: qty 1

## 2020-10-16 MED ORDER — ONDANSETRON HCL 4 MG/2ML IJ SOLN: 4.0000 mg | Freq: Four times a day (QID) | INTRAMUSCULAR | Status: DC | PRN

## 2020-10-16 NOTE — ED Triage Notes (Signed)
Patient complaining of itching to both arms. Lower lip swollen. Denies difficulty breathing or swallowing. Unknown source.

## 2020-10-16 NOTE — ED Notes (Signed)
Report given to [facility] 

## 2020-10-16 NOTE — ED Notes (Signed)
Right ear deafness

## 2020-10-16 NOTE — ED Notes (Signed)
Leaving with [facility] at this time. 

## 2020-10-16 NOTE — ED Notes (Signed)
Report given to [facility] at [facility].  Still waiting for transportation.  Family updated on plan.

## 2020-10-16 NOTE — ED Notes (Signed)
Pt in NAD, respirations even and unlabored.

## 2020-10-16 NOTE — ED Provider Notes (Signed)
[facility] Provider Note   CSN: [id number] Arrival date & time: 10/16/20  1558     History Chief Complaint  Patient presents with   Allergic Reaction    Peter Camacho is a 60 y.o. male with past medical history significant for allergies, asthma, hard of hearing, hypertension, prostate cancer presents to emergency department today with chief complaint of allergic reaction.  Patient states he was seen in an outside emergency department yesterday for the same.  He was discharged home and feeling okay.  When he woke up this morning he noticed he again had hives on his bilateral arms and abdomen.  He was sitting outside and noticed that he had swelling of his lower lip.  He denies any new medications or lotions. He does have an allergy to fish and shellfish however denies eating any new foods or any fish or shellfish.  He denies any difficulty breathing, nausea, emesis, abdominal pain, chest pain.  No medications taken prior to arrival.  Patient does have Zestoretic listed as a home medication along with amlodipine for blood pressure control.  Patient has been on these medications for "a while".   Per ED documentation from note yesterday: Peter Camacho is a 60 y.o. male with PMHx of HTN who presents to the ED via EMS with complaints of an allergic reaction that started this morning. According to the patient, he woke up this morning with hives on his arms and abomen. EMS stated that he was also diaphoretic and vomited once this morning. On his way to the hospital he was hypotensive and was administered 4 doses of 0.3 IM of epinephrine, 50 benadryl and 4 zofran.    Past Medical History:  Diagnosis Date   Allergy    Asthma    At risk for sleep apnea    STOP-BANG= 5    SENT TO PCP 03-17-2014   GI bleed    Hard of hearing    History of seizure    HX menigitis x2  in middle and high school - residual seizure's on medications--  last seizure age 23    Hypertension    Obesity    Personal history of meningitis    X2  MIDDLE AND HIGH SCHOOL--  residual seizure's on medication -- last seizure age 14   Prostate cancer ([place]) MONITORED BY DR OTTELIN   stage T1c, Gleason 3+4, PSA 12.79, vol 38.5cc    Patient Active Problem List   Diagnosis Date Noted   Anaphylaxis 81/02/7508   LSC (lichen simplex chronicus) 07/16/2020   Anemia due to blood loss    Duodenal ulcer with hemorrhage    Piriformis syndrome of right side 01/15/2019   Sciatica of right side 09/30/2018   Morbid obesity ([place]) 12/24/2017   Plantar fasciitis 08/09/2017   Seasonal allergies 08/09/2017   Lobar pneumonia ([place]) 11/30/2016   Sepsis due to pneumonia ([place]) 11/29/2016   Essential hypertension 11/29/2016   Tobacco dependence 11/29/2016    Past Surgical History:  Procedure Laterality Date   ESOPHAGOGASTRODUODENOSCOPY (EGD) WITH PROPOFOL N/A 08/10/2019   Procedure: ESOPHAGOGASTRODUODENOSCOPY (EGD) WITH PROPOFOL;  Surgeon: Ladene Artist, MD;  Location: [facility];  Service: Endoscopy;  Laterality: N/A;   HEMOSTASIS CONTROL  08/10/2019   Procedure: HEMOSTASIS CONTROL;  Surgeon: Ladene Artist, MD;  Location: [facility];  Service: Endoscopy;;   HOT HEMOSTASIS N/A 08/10/2019   Procedure: HOT HEMOSTASIS (ARGON PLASMA COAGULATION/BICAP);  Surgeon: Ladene Artist, MD;  Location: [facility];  Service:  Endoscopy;  Laterality: N/A;   PROSTATE BIOPSY     RADIOACTIVE SEED IMPLANT N/A 03/20/2014   Procedure: RADIOACTIVE SEED IMPLANT;  Surgeon: Claybon Jabs, MD;  Location[facility];  Service: Urology;  Laterality: N/A;   SCLEROTHERAPY  08/10/2019   Procedure: SCLEROTHERAPY;  Surgeon: Ladene Artist, MD;  [facility]: [facility];  Service: Endoscopy;;       Family History  Problem Relation Age of Onset   CAD Mother 55   Diabetes Mother    CAD Father 12   Cancer Neg Hx     Social History   Tobacco Use   Smoking status: Former    Packs/day: 0.30     Years: 40.00    Pack years: 12.00    Types: Cigarettes   Smokeless tobacco: Never   Tobacco comments:    quit 11/11/16  Vaping Use   Vaping Use: Never used  Substance Use Topics   Alcohol use: No   Drug use: No    Home Medications Prior to Admission medications   Medication Sig Start Date End Date Taking? Authorizing Provider  acetaminophen (TYLENOL) 500 MG tablet Take 500 mg by mouth every 6 (six) hours as needed for mild pain, moderate pain, fever or headache.    [provider]  albuterol (VENTOLIN HFA) 108 (90 Base) MCG/ACT inhaler INHALE 2 PUFFS INTO THE LUNGS EVERY 6 HOURS AS NEEDED FOR WHEEZING OR SHORTNESS OF BREATH 10/12/20   Wendling, [name], DO  amLODipine (NORVASC) 5 MG tablet Take 1 tablet (5 mg total) by mouth daily. 08/17/20   [name], DO  amoxicillin-clavulanate (AUGMENTIN) 875-125 MG tablet Take 1 tablet by mouth every 12 (twelve) hours. 10/02/20   Truddie Hidden, MD  Cholecalciferol (VITAMIN D3 PO) Take 1 tablet by mouth daily.    [provider]  meloxicam (MOBIC) 7.5 MG tablet Take 1 tablet (7.5 mg total) by mouth daily. 08/17/20   [name], DO  Multiple Vitamin (MULTIVITAMIN WITH MINERALS) TABS tablet Take 1 tablet by mouth daily.    [provider]  Psyllium (METAMUCIL PO) Take by mouth.    [provider]  Sod Picosulfate-Mag Ox-Cit Acd (CLENPIQ) 10-3.5-12 MG-GM -GM/160ML SOLN Take 1 kit by mouth as directed. 10/15/19   Cirigliano, Vito V, DO  SSD 1 % cream APPLY 1 APPLICATION TOPICALLY DAILY. 08/15/19   [name], DO  triamcinolone cream (KENALOG) 0.5 % Apply 1 application on to the skin 2 (two) times daily. 08/17/20   [name], DO    Allergies    Fish-derived products  Review of Systems   Review of Systems All other systems are reviewed and are negative for acute change except as noted in the HPI.  Physical Exam Updated Vital Signs BP (!) 142/83 (BP  Location: Left Arm)   Pulse (!) 117   Temp 98.3 F (36.8 C) (Oral)   Resp 18   Ht '6\' 2"'  (1.88 m)   Wt (!) 139 kg   SpO2 92%   BMI 39.34 kg/m   Physical Exam Vitals and nursing note reviewed.  Constitutional:      General: He is in acute distress.     Appearance: He is obese. He is ill-appearing.  HENT:     Head: Normocephalic and atraumatic.     Right Ear: Tympanic membrane and external ear normal.     Left Ear: Tympanic membrane and external ear normal.     Nose: Nose normal.  Mouth/Throat:     Mouth: Mucous membranes are moist. Angioedema present.     Pharynx: Oropharynx is clear.     Comments: Submandibular region is soft. Eyes:     General: No scleral icterus.       Right eye: No discharge.        Left eye: No discharge.     Extraocular Movements: Extraocular movements intact.     Conjunctiva/sclera: Conjunctivae normal.     Pupils: Pupils are equal, round, and reactive to light.  Neck:     Vascular: No JVD.  Cardiovascular:     Rate and Rhythm: Regular rhythm. Tachycardia present.     Pulses: Normal pulses.          Radial pulses are 2+ on the right side and 2+ on the left side.     Heart sounds: Normal heart sounds.  Pulmonary:     Comments: Lungs clear to auscultation in all fields. Symmetric chest rise. No wheezing, rales, or rhonchi. Oxygen saturation 92% on room air. Abdominal:     Comments: Abdomen is soft, non-distended, and non-tender in all quadrants. No rigidity, no guarding. No peritoneal signs.  Musculoskeletal:        General: Normal range of motion.     Cervical back: Normal range of motion.  Skin:    General: Skin is warm and dry.     Capillary Refill: Capillary refill takes less than 2 seconds.     Findings: Rash present.     Comments: Hives on bilateral arms and abdomen  Neurological:     Mental Status: He is oriented to person, place, and time.     GCS: GCS eye subscore is 4. GCS verbal subscore is 5. GCS motor subscore is 6.      Comments: Fluent speech, no facial droop.  Psychiatric:        Behavior: Behavior normal.    ED Results / Procedures / Treatments   Labs (all labs ordered are listed, but only abnormal results are displayed) Labs Reviewed  BASIC METABOLIC PANEL - Abnormal; Notable for the following components:      Result Value   Sodium 134 (*)    Potassium 3.4 (*)    Glucose, Bld 113 (*)    BUN 25 (*)    All other components within normal limits  CBC WITH DIFFERENTIAL/PLATELET - Abnormal; Notable for the following components:   WBC 14.8 (*)    Neutro Abs 11.6 (*)    Monocytes Absolute 1.2 (*)    All other components within normal limits  RESP PANEL BY RT-PCR (FLU A&B, COVID) [id number]    EKG None  Radiology DG Chest Port 1 View  Result Date: 10/16/2020 CLINICAL DATA:  Itching to both arms. Swollen lip. History of asthma. EXAM: PORTABLE CHEST 1 VIEW COMPARISON:  10/15/2020 FINDINGS: Cardiac silhouette is normal in size. No mediastinal or hilar masses. No evidence of adenopathy. Clear lungs.  No convincing pleural effusion and no pneumothorax. Skeletal structures are grossly intact. IMPRESSION: No active disease. Electronically Signed   By: Lajean Manes M.D.   On: 10/16/2020 17:32    Procedures .Critical Care  Date/Time: 10/16/2020 6:58 PM Performed by: Barrie Folk, PA-C Authorized by: Barrie Folk, PA-C   Critical care provider statement:    Critical care time (minutes):  40   Critical care was necessary to treat or prevent imminent or life-threatening deterioration of the following conditions: anyphlaxis vs angioedema.   Critical care was time spent personally  by me on the following activities:  Evaluation of patient's response to treatment, examination of patient, ordering and performing treatments and interventions, ordering and review of laboratory studies, ordering and review of radiographic studies, pulse oximetry, re-evaluation of patient's condition, obtaining  history from patient or surrogate, review of old charts and development of treatment plan with patient or surrogate   Care discussed with: admitting provider     Medications Ordered in ED Medications  sodium chloride 0.9 % bolus 1,000 mL (0 mLs Intravenous Stopped 10/16/20 1855)    And  0.9 %  sodium chloride infusion ( Intravenous New Bag/Given 10/16/20 1855)  EPINEPHrine (EPI-PEN) injection 0.3 mg (0.3 mg Intramuscular Given 10/16/20 1713)  methylPREDNISolone sodium succinate (SOLU-MEDROL) 125 mg/2 mL injection 125 mg (125 mg Intravenous Given 10/16/20 1720)  famotidine (PEPCID) IVPB 20 mg premix (0 mg Intravenous Stopped 10/16/20 1802)  ondansetron (ZOFRAN) injection 4 mg (4 mg Intravenous Given 10/16/20 1717)  diphenhydrAMINE (BENADRYL) injection 25 mg (25 mg Intravenous Given 10/16/20 1718)    ED Course  I have reviewed the triage vital signs and the nursing notes.  Pertinent labs & imaging results that were available during my care of the patient were reviewed by me and considered in my medical decision making (see chart for details).    MDM Rules/Calculators/A&P                           History provided by patient with additional history obtained from chart review.    Patient presenting with allergic reaction.  Triaged as a level 4 and had a 1 hour wait in the lobby.  Upon my evaluation of the patient once he was placed in the room, his exam is consistent with angioedema vs anaphylaxis.  He has hives on bilateral arms and abdomen.  His airway is still intact and he is able to communicate clearly however has significant lower lip swelling.  Tongue looks swollen to me however patient states it feels normal.  He denies any difficulty breathing or swallowing.  Patient was noted to be tachycardic in triage.  He has an oxygen saturation of 92% on room air during exam.  Reviewed patient's home meds and he does have Zestoretic listed as one of his medications which include lisinopril. This is not  a new medication. I have discontinued it from his medication list and advised him to stop taking it. Patient knows to follow up with pcp to discuss hypertension management.  EpiPen ordered as well as Solu-Medrol, Pepcid and Benadryl with Zofran and IV fluids.  We will check basic labs.  Patient will need hospital admission given this is his second anaphylactic reaction in 2 days. This case was discussed with ED supervising physician Dr. Roslynn Amble who has seen the patient and agrees with plan to admit.  Reassessed patient and he is resting comfortably.  Tachycardia has improved.  He still has signs of angioedema, swelling slightly improved. He is agreeable with plan for admission.  Airway remains patent. COVID test is negative.  CBC with leukocytosis of 14.8, could be related to steroids he received yesterday.  BMP overall unremarkable.  No severe electrolyte derangement.  Spoke with Dr. Rogers Blocker with hospitalist service who agrees to assume care of patient and bring into the hospital for further evaluation and management.     Portions of this note were generated with [employer]obby[employer]. Dictation errors may occur despite best attempts at proofreading.  Final Clinical Impression(s) /  ED Diagnoses Final diagnoses:  Angioedema, initial encounter    Rx / DC Orders ED Discharge Orders     None        Lewanda Rife 10/16/20 1859    Lucrezia Starch, MD 10/16/20 2039

## 2020-10-16 NOTE — Progress Notes (Signed)
Patient arrived to the unit. Paged Admitting

## 2020-10-16 NOTE — Progress Notes (Signed)
Received a phone call from Facility: [facility]  Requesting MD: Dr. Bertram Millard PA  Patient with h/o HTN, allergies and asthma  presenting with allergic reaction and angioedema.  This is his second anaphylactic reaction in 2 days. Today, he was tachycardic and had angioedema. He is on ACE-I. Given epi x1 .'3mg'$ , solu medrol, pepcid, benadryl and IVF. Continues to have significant angioedema and since 2nd time in 2 days will place in observation. Would stop ACE-I as well.  Vitals stable, no stridor, no need for oxygen.   Plan of care: place in observation at [facility]. Monitor overnight, stop ACE-I.   The patient will be accepted for admission to telemetry at [facility] when bed is available.   Nursing staff, Please call the [place] number at the top of Amion at the time of the patient's arrival so that the patient can be paged to the admitting physician.   Casimer Bilis, M.D. [facility]

## 2020-10-16 NOTE — H&P (Signed)
Peter Camacho UQJ:[id number] DOB: 03-14-1960 DOA: 10/16/2020     PCP: [name], DO   Outpatient Specialists:     Patient arrived to ER on 10/16/20 at 1558 Referred by Attending Orma Flaming, MD   Patient coming from: home Lives alone,     Chief Complaint:   Chief Complaint  Patient presents with   Allergic Reaction    HPI: [name] is a 60 y.o. male with medical history significant of    HTN, allergies and asthma    Presented with   allergic reaction and angioedema. Had second episode in the past 2 days  Reports hives initially that now resolved  Noted to be tachycardic   Given epi x1 .18m, solu medrol, pepcid, benadryl and IVF  Angioedema persistent ACE was stopped [facility] Patient has allergies to shellfish and fish but have not been exposed to anyone Patient did not endorse any difficulty breathing nausea abdominal pain chest pain. He was seen for angioedema in the emergency department yesterday and was able to be discharged to home this morning he woke up but he still had swollen lips and felt diaphoretic at which point EMS was called.  Briefly was hypotensive and received 4 doses of 0.3 IM of epinephrine 50 Benadryl and 4 of Zofran   Reports last dose of lisinopril/hCTZ was this AM Actually pt list states Hyzaar so he was possibly taking Losartan?hCTZ   Reports lips swelling is improving No wheezing No SOB  Occasional tobacco No EtOH Has  been vaccinated against COVID    Initial COVID TEST  NEGATIVE   Lab Results  Component Value Date   SC[place]08/20/2022   [place] 08/09/2019    Regarding pertinent Chronic problems:       HTN on NOrvasc, lisinopril/HCTZ    obesity-   BMI Readings from Last 1 Encounters:  10/16/20 39.34 kg/m      Asthma -well   controlled on home inhalers/ nebs     While in ER:   EpiPen ordered as well as Solu-Medrol, Pepcid and Benadryl with  Zofran and IV fluids. Tachycardia has improved while awaiting transfe    ED Triage Vitals  Enc Vitals Group     BP 10/16/20 1608 (!) 142/83     Pulse Rate 10/16/20 1608 (!) 117     Resp 10/16/20 1608 18     Temp 10/16/20 1608 98.3 F (36.8 C)     Temp Source 10/16/20 1608 Oral     SpO2 10/16/20 1608 92 %     Weight 10/16/20 1604 (!) 306 lb 7 oz (139 kg)     Height 10/16/20 1604 _0  (1.88 m)     Head Circumference --      Peak Flow --      Pain Score 10/16/20 1604 0     Pain Loc --      Pain Edu? --      Excl. in [place] --   TMAX(24)@     _________________________________________ Significant initial  Findings: Abnormal Labs Reviewed  BASIC METABOLIC PANEL - Abnormal; Notable for the following components:      Result Value   Sodium 134 (*)    Potassium 3.4 (*)    Glucose, Bld 113 (*)    BUN 25 (*)    All other components within normal limits  CBC WITH DIFFERENTIAL/PLATELET - Abnormal; Notable for the following components:   WBC 14.8 (*)  Neutro Abs 11.6 (*)    Monocytes Absolute 1.2 (*)    All other components within normal limits   ____________________________________________ Ordered    CXR -  NON acute    _________________________ Troponin 6 ECG: Ordered  __  The recent clinical data is shown below. Vitals:   10/16/20 1815 10/16/20 1950 10/16/20 2145 10/16/20 2320  BP: 134/89  (!) 141/81 (!) 151/91  Pulse: 96  94 81  Resp: 18  (!) 23 20  Temp:  98 F (36.7 C)  97.7 F (36.5 C)  TempSrc:  Oral  Oral  SpO2: 100%  98% 94%  Weight:      Height:        WBC     Component Value Date/Time   WBC 14.8 (H) 10/16/2020 1723   LYMPHSABS 1.9 10/16/2020 1723   MONOABS 1.2 (H) 10/16/2020 1723   EOSABS 0.0 10/16/2020 1723   BASOSABS 0.0 10/16/2020 1723       UA  ordered   Urine analysis:    Component Value Date/Time   COLORURINE AMBER (A) 11/29/2016 1215   APPEARANCEUR CLEAR 11/29/2016 1215   LABSPEC 1.020 11/29/2016 1215   PHURINE 6.0 11/29/2016 1215    GLUCOSEU NEGATIVE 11/29/2016 1215   HGBUR SMALL (A) 11/29/2016 1215   BILIRUBINUR SMALL (A) 11/29/2016 1215   KETONESUR 15 (A) 11/29/2016 1215   PROTEINUR 100 (A) 11/29/2016 1215   UROBILINOGEN 0.2 11/17/2012 1356   NITRITE NEGATIVE 11/29/2016 1215   LEUKOCYTESUR NEGATIVE 11/29/2016 1215    Results for orders placed or performed during the hospital encounter of 10/16/20  Resp Panel by RT-PCR (Flu A&B, Covid) Nasopharyngeal Swab     Status: None   Collection Time: 10/16/20  5:23 PM   Specimen: Nasopharyngeal Swab; Nasopharyngeal(NP) swabs in vial transport medium  Result Value Ref Range Status   SARS Coronavirus 2 by RT PCR NEGATIVE NEGATIVE Final         Influenza A by PCR NEGATIVE NEGATIVE Final   Influenza B by PCR NEGATIVE NEGATIVE Final           _______________________________________________ Hospitalist was called for admission for angioedema  The following Work up has been ordered so far:  Orders Placed This Encounter  Procedures   Critical Care   Resp Panel by RT-PCR (Flu A&B, Covid) Nasopharyngeal Swab   DG Chest Port 1 View   Basic metabolic panel   CBC with Differential   Cardiac monitoring   Initiate Carrier Fluid Protocol   Initiate Carrier Fluid Protocol   Cardiac monitoring   Consult to hospitalist   Airborne and Contact precautions   Pulse oximetry, continuous   Insert peripheral IV   Insert peripheral IV   Place in observation (patient's expected length of stay will be less than 2 midnights)      Following Medications were ordered in ER: Medications  sodium chloride 0.9 % bolus 1,000 mL (0 mLs Intravenous Stopped 10/16/20 1855)    And  0.9 %  sodium chloride infusion ( Intravenous New Bag/Given 10/16/20 1855)  EPINEPHrine (EPI-PEN) injection 0.3 mg (0.3 mg Intramuscular Given 10/16/20 1713)  methylPREDNISolone sodium succinate (SOLU-MEDROL) 125 mg/2 mL injection 125 mg (125 mg Intravenous Given 10/16/20 1720)  famotidine (PEPCID) IVPB 20 mg premix  (0 mg Intravenous Stopped 10/16/20 1802)  ondansetron (ZOFRAN) injection 4 mg (4 mg Intravenous Given 10/16/20 1717)  diphenhydrAMINE (BENADRYL) injection 25 mg (25 mg Intravenous Given 10/16/20 1718)        Consult Orders  (From admission,  onward)           Start     Ordered   10/16/20 1830  Consult to hospitalist  [place] for Consult talked to Tiana at 6:30  Once       Provider:  (Not yet assigned)  Question Answer Comment  Place call to: [facility]   Reason for Consult Admit      10/16/20 1829             OTHER Significant initial  Findings:  labs showing:    Recent Labs  Lab 10/16/20 1723  NA 134*  K 3.4*  CO2 25  GLUCOSE 113*  BUN 25*  CREATININE 1.17  CALCIUM 8.9    Cr    stable,    Lab Results  Component Value Date   CREATININE 1.17 10/16/2020   CREATININE 0.92 07/16/2020   CREATININE 1.12 08/12/2019    No results for input(s): AST, ALT, ALKPHOS, BILITOT, PROT, ALBUMIN in the last 168 hours. Lab Results  Component Value Date   CALCIUM 8.9 10/16/2020          Plt: Lab Results  Component Value Date   PLT 336 10/16/2020        Recent Labs  Lab 10/16/20 1723  WBC 14.8*  NEUTROABS 11.6*  HGB 14.9  HCT 43.1  MCV 88.1  PLT 336    HG/HCT  stable,       Component Value Date/Time   HGB 14.9 10/16/2020 1723   HCT 43.1 10/16/2020 1723   MCV 88.1 10/16/2020 1723     DM  labs:  HbA1C: Recent Labs    07/16/20 0916  HGBA1C 6.3       CBG (last 3)  No results for input(s): GLUCAP in the last 72 hours.    Cultures:    Component Value Date/Time   SDES EXPECTORATED SPUTUM 11/30/2016 1118   SDES EXPECTORATED SPUTUM 11/30/2016 1118   SPECREQUEST Normal 11/30/2016 1118   SPECREQUEST Normal Reflexed from [id number] 11/30/2016 1118   CULT  11/30/2016 1118    Consistent with normal respiratory flora. Performed at [facility], [place] [address] [place], [place] [address]    REPTSTATUS 11/30/2016 FINAL 11/30/2016  1118   REPTSTATUS 12/03/2016 FINAL 11/30/2016 1118     Radiological Exams on Admission: DG Chest Port 1 View  Result Date: 10/16/2020 CLINICAL DATA:  Itching to both arms. Swollen lip. History of asthma. EXAM: PORTABLE CHEST 1 VIEW COMPARISON:  10/15/2020 FINDINGS: Cardiac silhouette is normal in size. No mediastinal or hilar masses. No evidence of adenopathy. Clear lungs.  No convincing pleural effusion and no pneumothorax. Skeletal structures are grossly intact. IMPRESSION: No active disease. Electronically Signed   By: Lajean Manes M.D.   On: 10/16/2020 17:32   _______________________________________________________________________________________________________ Latest  Blood pressure (!) 151/91, pulse 81, temperature 97.7 F (36.5 C), temperature source Oral, resp. rate 20, height _0  (1.88 m), weight (!) 139 kg, SpO2 94 %.   Review of Systems:    Pertinent positives include: lip swelling, nausea  Constitutional:  No weight loss, night sweats, Fevers, chills, fatigue, weight loss  HEENT:  No headaches, Difficulty swallowing,Tooth/dental problems,Sore throat,  No sneezing, itching, ear ache, nasal congestion, post nasal drip,  Cardio-vascular:  No chest pain, Orthopnea, PND, anasarca, dizziness, palpitations.no Bilateral lower extremity swelling  GI:  No heartburn, indigestion, abdominal pain, nausea, vomiting, diarrhea, change in bowel habits, loss of appetite, melena, blood in stool, hematemesis Resp:  no shortness of breath  at rest. No dyspnea on exertion, No excess mucus, no productive cough, No non-productive cough, No coughing up of blood.No change in color of mucus.No wheezing. Skin:  no rash or lesions. No jaundice GU:  no dysuria, change in color of urine, no urgency or frequency. No straining to urinate.  No flank pain.  Musculoskeletal:  No joint pain or no joint swelling. No decreased range of motion. No back pain.  Psych:  No change in mood or affect. No  depression or anxiety. No memory loss.  Neuro: no localizing neurological complaints, no tingling, no weakness, no double vision, no gait abnormality, no slurred speech, no confusion  All systems reviewed and apart from [place] all are negative _______________________________________________________________________________________________ Past Medical History:   Past Medical History:  Diagnosis Date   Allergy    Asthma    At risk for sleep apnea    STOP-BANG= 5    SENT TO PCP 03-17-2014   GI bleed    Hard of hearing    History of seizure    HX menigitis x2  in middle and high school - residual seizure's on medications--  last seizure age 2   Hypertension    Obesity    Personal history of meningitis    X2  MIDDLE AND HIGH SCHOOL--  residual seizure's on medication -- last seizure age 38   Prostate cancer ([place]) MONITORED BY DR OTTELIN   stage T1c, Gleason 3+4, PSA 12.79, vol 38.5cc      Past Surgical History:  Procedure Laterality Date   ESOPHAGOGASTRODUODENOSCOPY (EGD) WITH PROPOFOL N/A 08/10/2019   Procedure: ESOPHAGOGASTRODUODENOSCOPY (EGD) WITH PROPOFOL;  Surgeon: Ladene Artist, MD;  Location: [facility];  Service: Endoscopy;  Laterality: N/A;   HEMOSTASIS CONTROL  08/10/2019   Procedure: HEMOSTASIS CONTROL;  Surgeon: Ladene Artist, MD;  Location: [facility];  Service: Endoscopy;;   HOT HEMOSTASIS N/A 08/10/2019   Procedure: HOT HEMOSTASIS (ARGON PLASMA COAGULATION/BICAP);  Surgeon: Ladene Artist, MD;  Location: [facility];  Service: Endoscopy;  Laterality: N/A;   PROSTATE BIOPSY     RADIOACTIVE SEED IMPLANT N/A 03/20/2014   Procedure: RADIOACTIVE SEED IMPLANT;  Surgeon: Claybon Jabs, MD;  Location: [facility];  Service: Urology;  Laterality: N/A;   SCLEROTHERAPY  08/10/2019   Procedure: SCLEROTHERAPY;  Surgeon: Ladene Artist, MD;  Location: [facility];  Service: Endoscopy;;    Social History:  Ambulatory  independently     reports that he  has quit smoking. His smoking use included cigarettes. He has a 12.00 pack-year smoking history. He has never used smokeless tobacco. He reports that he does not drink alcohol and does not use drugs.     Family History:   Family History  Problem Relation Age of Onset   CAD Mother 8   Diabetes Mother    CAD Father 70   Cancer Neg Hx    ______________________________________________________________________________________________ Allergies: Allergies  Allergen Reactions   Fish-Derived Products Rash     Prior to Admission medications   Medication Sig Start Date End Date Taking? Authorizing Provider  acetaminophen (TYLENOL) 500 MG tablet Take 500 mg by mouth every 6 (six) hours as needed for mild pain, moderate pain, fever or headache.    [provider]  albuterol (VENTOLIN HFA) 108 (90 Base) MCG/ACT inhaler INHALE 2 PUFFS INTO THE LUNGS EVERY 6 HOURS AS NEEDED FOR WHEEZING OR SHORTNESS OF BREATH 10/12/20   Wendling, [name], DO  amLODipine (NORVASC) 5 MG tablet Take 1 tablet (  5 mg total) by mouth daily. 08/17/20   [name], DO  amoxicillin-clavulanate (AUGMENTIN) 875-125 MG tablet Take 1 tablet by mouth every 12 (twelve) hours. 10/02/20   Truddie Hidden, MD  Cholecalciferol (VITAMIN D3 PO) Take 1 tablet by mouth daily.    [provider]  meloxicam (MOBIC) 7.5 MG tablet Take 1 tablet (7.5 mg total) by mouth daily. 08/17/20   [name], DO  Multiple Vitamin (MULTIVITAMIN WITH MINERALS) TABS tablet Take 1 tablet by mouth daily.    [provider]  Psyllium (METAMUCIL PO) Take by mouth.    [provider]  Sod Picosulfate-Mag Ox-Cit Acd (CLENPIQ) 10-3.5-12 MG-GM -GM/160ML SOLN Take 1 kit by mouth as directed. 10/15/19   Cirigliano, Vito V, DO  SSD 1 % cream APPLY 1 APPLICATION TOPICALLY DAILY. 08/15/19   [name], DO  triamcinolone cream (KENALOG) 0.5 % Apply 1 application on to the skin 2 (two) times  daily. 08/17/20   [name], DO    ___________________________________________________________________________________________________ Physical Exam: Vitals with BMI 10/16/2020 10/16/2020 10/16/2020  Height - - -  Weight - - -  BMI - - -  Systolic [phone]  Diastolic 91 81 89  Pulse 81 94 96    1. General:  in No  Acute distress   Chronically ill -appearing 2. Psychological: Alert and  Oriented 3. Head/ENT:   Dry Mucous Membranes                          Head Non traumatic, neck supple                          Poor Dentition 4. SKIN:  decreased Skin turgor,  Skin clean Dry and intact no rash  Yesterday     Vs now   5. Heart: Regular rate and rhythm no  Murmur, no Rub or gallop 6. Lungs:  Clear to auscultation bilaterally, no wheezes or crackles   7. Abdomen: Soft,  non-tender, Non distended   obese  bowel sounds present 8. Lower extremities: no clubbing, cyanosis, no  edema 9. Neurologically Grossly intact, moving all 4 extremities equally   10. MSK: Normal range of motion    Chart has been reviewed  ______________________________________________________________________________________________  Assessment/Plan 60 y.o. male with medical history significant of    HTN, allergies and asthma     Admitted for angioedema  Present on Admission:    ACE inhibitor-aggravated angioedema/ Anaphylaxis -continue close monitoring EpiPen at bedside Solu-Medrol scheduled, Pepcid scheduled, as needed Benadryl patient currently stating that he is feeling better.  No evidence of wheezing or respiratory complaints.  Continue to monitor closely Pt has hx of Fish allergies  reports he did eat cooked salmon this AM and  And he has done well with that in the past    Hypokalemia - unclear if was re pleated will check mag level replace as needed   -   Tobacco dependence -  - Spoke about importance of quitting spent 5 minutes discussing options for treatment, prior attempts at  quitting, and dangers of smoking  -At this point patient is     interested in quitting  - declines  nicotine patch   - nursing tobacco cessation protocol   Essential hypertension -given hypotension hold Norvasc. Given ACE inhibitor induced angioedema will stop lisinopril/hydrochlorothiazide Add ACE inhibitor as Allergy   Morbid obesity ([place]) - chronic stable, follow  up with nutrition as outpatient  Patient is hard of hearing review Hearing aids in place  Other plan as per orders.  DVT prophylaxis:  SCD       Code Status:    Code Status: Prior FULL CODE  as per patient   I had personally discussed CODE STATUS with patient     Family Communication:   Family not at  Bedside    Disposition Plan:       To home once workup is complete and patient is stable   Following barriers for discharge:                            Electrolytes corrected                                                  Consults called: none  Admission status:  ED Disposition     ED Disposition  Admit   Condition  --   [place]: [place] [[id number]]  Level of Care: Telemetry [5]  Admit to tele based on following criteria: Other see comments  Comments: anaphylaxis  Interfacility transfer: Yes  May place patient in observation at [facility] or [place] if equivalent level of care is available:: Yes  Covid Evaluation: Confirmed COVID Negative  Diagnosis: Anaphylaxis [160109]  Admitting Physician: Orma Flaming [[id number]]  Attending Physician: Orma Flaming [[id number]]           Obs     Level of care     tele  For  indefinitely please discontinue once patient no longer qualifies COVID-19 Labs   Lab Results  Component Value Date   [place] 10/16/2020     Precautions: admitted as  Covid Negative     PPE: Used by the provider:   N95  eye Goggles,  Gloves    Elyanah Farino 10/17/2020, 1:27 AM    [facility]     after 2 AM  please page floor coverage PA If 7AM-7PM, please contact the day team taking care of the patient using [email or web address]   Patient was evaluated in the context of the global COVID-19 pandemic, which necessitated consideration that the patient might be at risk for infection with the SARS-CoV-2 virus that causes COVID-19. Institutional protocols and algorithms that pertain to the evaluation of patients at risk for COVID-19 are in a state of rapid change based on information released by regulatory bodies including the CDC and federal and state organizations. These policies and algorithms were followed during the patient's care.

## 2020-10-17 DIAGNOSIS — E876 Hypokalemia: Secondary | ICD-10-CM | POA: Diagnosis not present

## 2020-10-17 DIAGNOSIS — J45909 Unspecified asthma, uncomplicated: Secondary | ICD-10-CM | POA: Diagnosis not present

## 2020-10-17 DIAGNOSIS — T782XXA Anaphylactic shock, unspecified, initial encounter: Secondary | ICD-10-CM | POA: Diagnosis not present

## 2020-10-17 DIAGNOSIS — Z20822 Contact with and (suspected) exposure to covid-19: Secondary | ICD-10-CM | POA: Diagnosis not present

## 2020-10-17 DIAGNOSIS — T464X5A Adverse effect of angiotensin-converting-enzyme inhibitors, initial encounter: Secondary | ICD-10-CM | POA: Diagnosis not present

## 2020-10-17 DIAGNOSIS — T783XXA Angioneurotic edema, initial encounter: Secondary | ICD-10-CM | POA: Diagnosis not present

## 2020-10-17 DIAGNOSIS — I10 Essential (primary) hypertension: Secondary | ICD-10-CM | POA: Diagnosis not present

## 2020-10-17 DIAGNOSIS — T7840XA Allergy, unspecified, initial encounter: Secondary | ICD-10-CM | POA: Diagnosis not present

## 2020-10-17 LAB — CBC WITH DIFFERENTIAL/PLATELET
Abs Immature Granulocytes: 0.03 10*3/uL (ref 0.00–0.07)
Basophils Absolute: 0 10*3/uL (ref 0.0–0.1)
Basophils Relative: 0 %
Eosinophils Absolute: 0 10*3/uL (ref 0.0–0.5)
Eosinophils Relative: 0 %
HCT: 41.3 % (ref 39.0–52.0)
Hemoglobin: 13.8 g/dL (ref 13.0–17.0)
Immature Granulocytes: 0 %
Lymphocytes Relative: 4 %
Lymphs Abs: 0.5 10*3/uL — ABNORMAL LOW (ref 0.7–4.0)
MCH: 30.4 pg (ref 26.0–34.0)
MCHC: 33.4 g/dL (ref 30.0–36.0)
MCV: 91 fL (ref 80.0–100.0)
Monocytes Absolute: 0.1 10*3/uL (ref 0.1–1.0)
Monocytes Relative: 1 %
Neutro Abs: 11.2 10*3/uL — ABNORMAL HIGH (ref 1.7–7.7)
Neutrophils Relative %: 95 %
Platelets: 291 10*3/uL (ref 150–400)
RBC: 4.54 MIL/uL (ref 4.22–5.81)
RDW: 15.9 % — ABNORMAL HIGH (ref 11.5–15.5)
WBC: 11.9 10*3/uL — ABNORMAL HIGH (ref 4.0–10.5)
nRBC: 0 % (ref 0.0–0.2)

## 2020-10-17 LAB — PHOSPHORUS: Phosphorus: 3.1 mg/dL (ref 2.5–4.6)

## 2020-10-17 LAB — COMPREHENSIVE METABOLIC PANEL
ALT: 18 U/L (ref 0–44)
AST: 20 U/L (ref 15–41)
Albumin: 3.4 g/dL — ABNORMAL LOW (ref 3.5–5.0)
Alkaline Phosphatase: 84 U/L (ref 38–126)
Anion gap: 7 (ref 5–15)
BUN: 21 mg/dL (ref 8–23)
CO2: 27 mmol/L (ref 22–32)
Calcium: 8.9 mg/dL (ref 8.9–10.3)
Chloride: 105 mmol/L (ref 98–111)
Creatinine, Ser: 0.98 mg/dL (ref 0.61–1.24)
GFR, Estimated: >60 mL/min (ref >60)
Glucose, Bld: 147 mg/dL — ABNORMAL HIGH (ref 70–99)
Potassium: 4.1 mmol/L (ref 3.5–5.1)
Sodium: 139 mmol/L (ref 135–145)
Total Bilirubin: 0.3 mg/dL (ref 0.3–1.2)
Total Protein: 7.1 g/dL (ref 6.5–8.1)

## 2020-10-17 LAB — BASIC METABOLIC PANEL
Anion gap: 9 (ref 5–15)
BUN: 22 mg/dL (ref 8–23)
CO2: 26 mmol/L (ref 22–32)
Calcium: 8.7 mg/dL — ABNORMAL LOW (ref 8.9–10.3)
Chloride: 102 mmol/L (ref 98–111)
Creatinine, Ser: 0.9 mg/dL (ref 0.61–1.24)
GFR, Estimated: >60 mL/min (ref >60)
Glucose, Bld: 142 mg/dL — ABNORMAL HIGH (ref 70–99)
Potassium: 4.1 mmol/L (ref 3.5–5.1)
Sodium: 137 mmol/L (ref 135–145)

## 2020-10-17 LAB — HIV ANTIBODY (ROUTINE TESTING W REFLEX): HIV Screen 4th Generation wRfx: NONREACTIVE

## 2020-10-17 LAB — TSH: TSH: 0.223 u[IU]/mL — ABNORMAL LOW (ref 0.350–4.500)

## 2020-10-17 LAB — MAGNESIUM
Magnesium: 1.9 mg/dL (ref 1.7–2.4)
Magnesium: 2.2 mg/dL (ref 1.7–2.4)

## 2020-10-17 MED ORDER — EPINEPHRINE 0.3 MG/0.3ML IJ SOAJ
0.3000 mg | INTRAMUSCULAR | Status: DC | PRN
  Filled 2020-10-17: qty 0.6

## 2020-10-17 MED ORDER — AMLODIPINE BESYLATE 5 MG PO TABS
5.0000 mg | ORAL_TABLET | Freq: Every day | ORAL | 2 refills | Status: DC
Start: 2020-10-17 — End: 2020-10-19

## 2020-10-17 MED ORDER — DIPHENHYDRAMINE HCL 50 MG PO TABS: 25.0000 mg | ORAL_TABLET | Freq: Four times a day (QID) | ORAL | 0 refills | Status: DC | PRN

## 2020-10-17 MED ORDER — FAMOTIDINE 20 MG PO TABS: 20.0000 mg | ORAL_TABLET | Freq: Two times a day (BID) | ORAL | 0 refills | Status: DC

## 2020-10-17 MED ORDER — HYDROCHLOROTHIAZIDE 25 MG PO TABS: 25.0000 mg | ORAL_TABLET | Freq: Every day | ORAL | 2 refills | Status: DC

## 2020-10-17 MED ORDER — METHYLPREDNISOLONE 4 MG PO TBPK: ORAL_TABLET | ORAL | 0 refills | Status: DC

## 2020-10-17 MED ORDER — AMLODIPINE BESYLATE 10 MG PO TABS
10.0000 mg | ORAL_TABLET | Freq: Every day | ORAL | Status: DC
  Administered 2020-10-17: 10 mg via ORAL
  Filled 2020-10-17: qty 1

## 2020-10-17 MED ORDER — EPINEPHRINE 0.3 MG/0.3ML IJ SOAJ: 0.3000 mg | INTRAMUSCULAR | 0 refills | Status: AC | PRN

## 2020-10-17 NOTE — Discharge Summary (Signed)
Physician Discharge Summary  Peter Camacho [id number] DOB: [birth year] DOA: 10/16/2020  PCP: [name], DO  Admit date: 10/16/2020 Discharge date: 10/17/2020  Admitted From: Home Disposition: Home  Recommendations for Outpatient Follow-up:  Follow up with PCP in 1-2 weeks Continue Medrol Dosepak as directed Benadryl as needed for itching/swelling Famotidine 20 mg p.o. twice daily x7 days Discontinued lisinopril due to angioedema, now listed as allergy Prescription for EpiPen given Increase amlodipine to 10 mg p.o. daily Follow-up blood pressure is now off lisinopril   Home Health: No Equipment/Devices: None  Discharge Condition: Stable CODE STATUS: Full code Diet recommendation: Heart healthy diet  History of present illness:  Peter Camacho is a 60 year old male with past medical history significant for essential hypertension, tobacco dependency, hard of hearing, and morbid obesity who initially presented to [facility] with the swelling of his lips.  Apparently this is his second anaphylactic reaction in 2 days.  He is currently on an ACE inhibitor with lisinopril.  He was given epinephrine x1 in the ED, started on Solu-Medrol, Pepcid, Benadryl and given IV fluids.  Given his significant angioedema, patient was transferred to [facility] for further observation and management for ACE inhibitor induced angioedema.   Hospital course:  ACE inhibitor induced angioedema Patient presenting to [facility] for itching to bilateral upper extremities, swelling to his lips.  Patient denied any difficulty breathing or swallowing.  Patient has prescribed an ACE inhibitor outpatient which was subsequently discontinued now.  Given epinephrine injection, IV Solu-Medrol, Pepcid, Benadryl and IV fluids in the ED.  Given his significant edema, EDP requested transfer to [facility] for further observation.  Patient continued on IV Solu-Medrol, IV  Pepcid Benadryl as needed during hospitalization with improvement of his oral swelling.  Patient never had any airway compromise and tolerating diet without issue.  Patient's edema much improved and ready for discharge home.  Will discharge home on Medrol Dosepak, p.o. Pepcid, Benadryl as needed.  Lisinopril has been discontinued.  Outpatient follow-up with PCP.  Epinephrine pen prescription given.  Essential hypertension Lisinopril discontinued as above.  Amlodipine increased to 10 mg p.o. daily.  New prescription for [place] placed.  Outpatient follow-up with PCP for further monitoring/adjustments.  Tobacco use disorder Counseled on need for complete cessation.  Outpatient follow-up with PCP.  Morbid obesity Body mass index is 39.63 kg/m.  Discussed with patient needs for aggressive lifestyle changes/weight loss as this complicates all facets of care.  Outpatient follow-up with PCP.  May benefit from bariatric evaluation outpatient.  Discharge Diagnoses:  Principal Problem:   ACE inhibitor-aggravated angioedema Active Problems:   Essential hypertension   Tobacco dependence   Morbid obesity ([place])   Anaphylaxis    Discharge Instructions  Discharge Instructions     Call MD for:  difficulty breathing, headache or visual disturbances   Complete by: As directed    Call MD for:  extreme fatigue   Complete by: As directed    Call MD for:  persistant dizziness or light-headedness   Complete by: As directed    Call MD for:  persistant nausea and vomiting   Complete by: As directed    Call MD for:  severe uncontrolled pain   Complete by: As directed    Call MD for:  temperature >100.4   Complete by: As directed    Diet - low sodium heart healthy   Complete by: As directed    Increase activity slowly  Complete by: As directed       Allergies as of 10/17/2020       Reactions   Ace Inhibitors Anaphylaxis   Fish-derived Products Rash        Medication List     STOP taking  these medications    amoxicillin-clavulanate 875-125 MG tablet Commonly known as: AUGMENTIN   lisinopril-hydrochlorothiazide 20-25 MG tablet Commonly known as: ZESTORETIC   meloxicam 7.5 MG tablet Commonly known as: MOBIC   SSD 1 % cream Generic drug: silver sulfADIAZINE   triamcinolone cream 0.5 % Commonly known as: KENALOG       TAKE these medications    acetaminophen 500 MG tablet Commonly known as: TYLENOL Take 500 mg by mouth every 6 (six) hours as needed for mild pain, moderate pain, fever or headache.   albuterol 108 (90 Base) MCG/ACT inhaler Commonly known as: VENTOLIN HFA INHALE 2 PUFFS INTO THE LUNGS EVERY 6 HOURS AS NEEDED FOR WHEEZING OR SHORTNESS OF BREATH   amLODipine 5 MG tablet Commonly known as: NORVASC Take 1 tablet (5 mg total) by mouth daily.   diphenhydrAMINE 50 MG tablet Commonly known as: BENADRYL Take 0.5 tablets (25 mg total) by mouth every 6 (six) hours as needed for itching.   EPINEPHrine 0.3 mg/0.3 mL Soaj injection Commonly known as: EPI-PEN Inject 0.3 mg into the muscle as needed for anaphylaxis.   famotidine 20 MG tablet Commonly known as: PEPCID Take 1 tablet (20 mg total) by mouth 2 (two) times daily for 7 days.   hydrochlorothiazide 25 MG tablet Commonly known as: HYDRODIURIL Take 1 tablet (25 mg total) by mouth daily.   methylPREDNISolone 4 MG Tbpk tablet Commonly known as: MEDROL DOSEPAK Use as directed   multivitamin with minerals Tabs tablet Take 1 tablet by mouth daily.        Follow-up Information     [name], DO. Schedule an appointment as soon as possible for a visit in 1 week(s).   Specialty: Family Medicine Contact information: [address] [name] [address] [phone]                Allergies  Allergen Reactions   Ace Inhibitors Anaphylaxis   Fish-Derived Products Rash    Consultations: None   Procedures/Studies: DG Chest Port 1 View  Result  Date: 10/16/2020 CLINICAL DATA:  Itching to both arms. Swollen lip. History of asthma. EXAM: PORTABLE CHEST 1 VIEW COMPARISON:  10/15/2020 FINDINGS: Cardiac silhouette is normal in size. No mediastinal or hilar masses. No evidence of adenopathy. Clear lungs.  No convincing pleural effusion and no pneumothorax. Skeletal structures are grossly intact. IMPRESSION: No active disease. Electronically Signed   By: Lajean Manes M.D.   On: 10/16/2020 17:32     Subjective: Patient seen examined bedside, resting comfortably.  Continues with some mild itching of his bilateral upper extremities, lips are much less swollen.  Tolerating diet.  Ready for discharge home.  Updated patient's sister is present at bedside.  No other complaints or concerns at this time.  Denies headache, no fever/chills/night sweats, no nausea/vomiting/diarrhea, no dysphagia, no chest pain, no shortness of breath, no abdominal pain, no weakness, no fatigue, no paresthesias.  No acute events overnight per nurse staff.  Discharge Exam: Vitals:   10/17/20 1000 10/17/20 1146  BP: (!) 158/93 (!) 157/88  Pulse: 80 94  Resp:    Temp: 98.1 F (36.7 C)   SpO2: 97%    Vitals:   10/17/20 [id number]  10/17/20 0906 10/17/20 1000 10/17/20 1146  BP: (!) 171/91 (!) 185/113 (!) 158/93 (!) 157/88  Pulse: 79 72 80 94  Resp: 20     Temp: 97.8 F (36.6 C)  98.1 F (36.7 C)   TempSrc: Oral  Oral   SpO2: 99%  97%   Weight:      Height:        General: Pt is alert, awake, not in acute distress, obese HEENT: Mild periorbital edema, no erythema, posterior pharynx with no swelling/erythema, no exudates, uvula midline Cardiovascular: RRR, S1/S2 +, no rubs, no gallops Respiratory: CTA bilaterally, no wheezing, no rhonchi, on room air Abdominal: Soft, NT, ND, bowel sounds + Extremities: no edema, no cyanosis, mild excoriation noted bilateral forearms    The results of significant diagnostics from this hospitalization (including imaging, microbiology,  ancillary and laboratory) are listed below for reference.     Microbiology: Recent Results (from the past 240 hour(s))  Resp Panel by RT-PCR (Flu A&B, Covid) Nasopharyngeal Swab     Status: None   Collection Time: 10/16/20  5:23 PM   Specimen: Nasopharyngeal Swab; Nasopharyngeal(NP) swabs in vial transport medium  Result Value Ref Range Status   SARS Coronavirus 2 by RT PCR NEGATIVE NEGATIVE Final    Comment: (NOTE) SARS-CoV-2 target nucleic acids are NOT DETECTED.  The SARS-CoV-2 RNA is generally detectable in upper respiratory specimens during the acute phase of infection. The lowest concentration of SARS-CoV-2 viral copies this assay can detect is 138 copies/mL. A negative result does not preclude SARS-Cov-2 infection and should not be used as the sole basis for treatment or other patient management decisions. A negative result may occur with  improper specimen collection/handling, submission of specimen other than nasopharyngeal swab, presence of viral mutation(s) within the areas targeted by this assay, and inadequate number of viral copies(<138 copies/mL). A negative result must be combined with clinical observations, patient history, and epidemiological information. The expected result is Negative.  Fact Sheet for Patients:  EntrepreneurPulse.com.au  Fact Sheet for Healthcare Providers:  IncredibleEmployment.be  This test is no t yet approved or cleared by the [place] FDA and  has been authorized for detection and/or diagnosis of SARS-CoV-2 by FDA under an Emergency Use Authorization (EUA). This EUA will remain  in effect (meaning this test can be used) for the duration of the COVID-19 declaration under Section 564(b)(1) of the Act, 21 U.S.C.section 360bbb-3(b)(1), unless the authorization is terminated  or revoked sooner.       Influenza A by PCR NEGATIVE NEGATIVE Final   Influenza B by PCR NEGATIVE NEGATIVE Final     Comment: (NOTE) The Xpert Xpress SARS-CoV-2/FLU/RSV plus assay is intended as an aid in the diagnosis of influenza from Nasopharyngeal swab specimens and should not be used as a sole basis for treatment. Nasal washings and aspirates are unacceptable for Xpert Xpress SARS-CoV-2/FLU/RSV testing.  Fact Sheet for Patients: EntrepreneurPulse.com.au  Fact Sheet for Healthcare Providers: IncredibleEmployment.be  This test is not yet approved or cleared by the [place] FDA and has been authorized for detection and/or diagnosis of SARS-CoV-2 by FDA under an Emergency Use Authorization (EUA). This EUA will remain in effect (meaning this test can be used) for the duration of the COVID-19 declaration under Section 564(b)(1) of the Act, 21 U.S.C. section 360bbb-3(b)(1), unless the authorization is terminated or revoked.  Performed at [facility], [address] [place], [name] [address]      Labs: BNP (last 3 results) No results  for input(s): BNP in the last 8760 hours. Basic Metabolic Panel: Recent Labs  Lab 10/16/20 1723 10/17/20 0031 10/17/20 0414  NA 134* 139 137  K 3.4* 4.1 4.1  CL 99 105 102  CO2 '25 27 26  '$ GLUCOSE 113* 147* 142*  BUN 25* 21 22  CREATININE 1.17 0.98 0.90  CALCIUM 8.9 8.9 8.7*  MG  --  1.9 2.2  PHOS  --  3.1  --    Liver Function Tests: Recent Labs  Lab 10/17/20 0031  AST 20  ALT 18  ALKPHOS 84  BILITOT 0.3  PROT 7.1  ALBUMIN 3.4*   No results for input(s): LIPASE, AMYLASE in the last 168 hours. No results for input(s): AMMONIA in the last 168 hours. CBC: Recent Labs  Lab 10/16/20 1723 10/17/20 0031  WBC 14.8* 11.9*  NEUTROABS 11.6* 11.2*  HGB 14.9 13.8  HCT 43.1 41.3  MCV 88.1 91.0  PLT 336 291   Cardiac Enzymes: No results for input(s): CKTOTAL, CKMB, CKMBINDEX, TROPONINI in the last 168 hours. BNP: Invalid input(s): POCBNP CBG: No results for input(s): GLUCAP in the last 168  hours. D-Dimer No results for input(s): DDIMER in the last 72 hours. Hgb A1c No results for input(s): HGBA1C in the last 72 hours. Lipid Profile No results for input(s): CHOL, HDL, LDLCALC, TRIG, CHOLHDL, LDLDIRECT in the last 72 hours. Thyroid function studies Recent Labs    10/17/20 0031  TSH 0.223*   Anemia work up No results for input(s): VITAMINB12, FOLATE, FERRITIN, TIBC, IRON, RETICCTPCT in the last 72 hours. Urinalysis    Component Value Date/Time   COLORURINE AMBER (A) 11/29/2016 1215   APPEARANCEUR CLEAR 11/29/2016 1215   LABSPEC 1.020 11/29/2016 1215   PHURINE 6.0 11/29/2016 1215   GLUCOSEU NEGATIVE 11/29/2016 1215   HGBUR SMALL (A) 11/29/2016 1215   BILIRUBINUR SMALL (A) 11/29/2016 1215   KETONESUR 15 (A) 11/29/2016 1215   PROTEINUR 100 (A) 11/29/2016 1215   UROBILINOGEN 0.2 11/17/2012 1356   NITRITE NEGATIVE 11/29/2016 1215   LEUKOCYTESUR NEGATIVE 11/29/2016 1215   Sepsis Labs Invalid input(s): PROCALCITONIN,  WBC,  LACTICIDVEN Microbiology Recent Results (from the past 240 hour(s))  Resp Panel by RT-PCR (Flu A&B, Covid) Nasopharyngeal Swab     Status: None   Collection Time: 10/16/20  5:23 PM   Specimen: Nasopharyngeal Swab; Nasopharyngeal(NP) swabs in vial transport medium  Result Value Ref Range Status   SARS Coronavirus 2 by RT PCR NEGATIVE NEGATIVE Final    Comment: (NOTE) SARS-CoV-2 target nucleic acids are NOT DETECTED.  The SARS-CoV-2 RNA is generally detectable in upper respiratory specimens during the acute phase of infection. The lowest concentration of SARS-CoV-2 viral copies this assay can detect is 138 copies/mL. A negative result does not preclude SARS-Cov-2 infection and should not be used as the sole basis for treatment or other patient management decisions. A negative result may occur with  improper specimen collection/handling, submission of specimen other than nasopharyngeal swab, presence of viral mutation(s) within the areas  targeted by this assay, and inadequate number of viral copies(<138 copies/mL). A negative result must be combined with clinical observations, patient history, and epidemiological information. The expected result is Negative.  Fact Sheet for Patients:  EntrepreneurPulse.com.au  Fact Sheet for Healthcare Providers:  IncredibleEmployment.be  This test is no t yet approved or cleared by the [place] FDA and  has been authorized for detection and/or diagnosis of SARS-CoV-2 by FDA under an Emergency Use Authorization (EUA). This EUA will remain  in  effect (meaning this test can be used) for the duration of the COVID-19 declaration under Section 564(b)(1) of the Act, 21 U.S.C.section 360bbb-3(b)(1), unless the authorization is terminated  or revoked sooner.       Influenza A by PCR NEGATIVE NEGATIVE Final   Influenza B by PCR NEGATIVE NEGATIVE Final    Comment: (NOTE) The Xpert Xpress SARS-CoV-2/FLU/RSV plus assay is intended as an aid in the diagnosis of influenza from Nasopharyngeal swab specimens and should not be used as a sole basis for treatment. Nasal washings and aspirates are unacceptable for Xpert Xpress SARS-CoV-2/FLU/RSV testing.  Fact Sheet for Patients: EntrepreneurPulse.com.au  Fact Sheet for Healthcare Providers: IncredibleEmployment.be  This test is not yet approved or cleared by the [place] FDA and has been authorized for detection and/or diagnosis of SARS-CoV-2 by FDA under an Emergency Use Authorization (EUA). This EUA will remain in effect (meaning this test can be used) for the duration of the COVID-19 declaration under Section 564(b)(1) of the Act, 21 U.S.C. section 360bbb-3(b)(1), unless the authorization is terminated or revoked.  Performed at [facility], [address] [place], [place] [address]      Time coordinating discharge: Over 30  minutes  SIGNED:   Tawfiq Favila J [place] DO  [facility] 10/17/2020, 11:59 AM

## 2020-10-18 ENCOUNTER — Telehealth: Payer: Self-pay

## 2020-10-18 NOTE — Telephone Encounter (Signed)
Patient called after hours requesting hospital f/u appt.  Attempted to contact patient, LM requesting call back to schedule.

## 2020-10-18 NOTE — Telephone Encounter (Signed)
Transition Care Management Follow-up Telephone Call Date of discharge and from where: 10/17/2020-[facility] How have you been since you were released from the hospital? Feeling a little better. Still having some itching off & on but Benadryl helps. Any questions or concerns? No  Items Reviewed: Did the pt receive and understand the discharge instructions provided? Yes  Medications obtained and verified? Yes  Other? Yes  Any new allergies since your discharge? No  Dietary orders reviewed? Yes Do you have support at home? Yes   Home Care and Equipment/Supplies: Were home health services ordered? no If so, what is the name of the agency? N/a  Has the agency set up a time to come to the patient's home? not applicable Were any new equipment or medical supplies ordered?  No What is the name of the medical supply agency? N/a Were you able to get the supplies/equipment? not applicable Do you have any questions related to the use of the equipment or supplies? N/a  Functional Questionnaire: (I = Independent and D = Dependent) ADLs: I  Bathing/Dressing- I  Meal Prep- I  Eating- I  Maintaining continence- I  Transferring/Ambulation- I  Managing Meds- I  Follow up appointments reviewed:  PCP Hospital f/u appt confirmed? Yes  Scheduled to see Peter Camacho on 10/19/2020 @ 10:40. [facility] f/u appt confirmed?  N/a   Are transportation arrangements needed? No  If their condition worsens, is the pt aware to call PCP or go to the Emergency Dept.? Yes Was the patient provided with contact information for the PCP's office or ED? Yes Was to pt encouraged to call back with questions or concerns? Yes

## 2020-10-18 NOTE — Telephone Encounter (Signed)
Patient called back, made an appointment with [facility] for 08/23 due to the patient's medication side effect

## 2020-10-19 ENCOUNTER — Ambulatory Visit: Payer: 59 | Admitting: Medical

## 2020-10-19 ENCOUNTER — Other Ambulatory Visit: Payer: Self-pay

## 2020-10-19 ENCOUNTER — Telehealth: Payer: Self-pay | Admitting: Medical

## 2020-10-19 ENCOUNTER — Other Ambulatory Visit (HOSPITAL_BASED_OUTPATIENT_CLINIC_OR_DEPARTMENT_OTHER): Payer: Self-pay

## 2020-10-19 VITALS — BP 115/68 | HR 100 | Resp 20 | Ht 74.0 in | Wt 313.0 lb

## 2020-10-19 DIAGNOSIS — I9589 Other hypotension: Secondary | ICD-10-CM | POA: Diagnosis not present

## 2020-10-19 DIAGNOSIS — T7840XD Allergy, unspecified, subsequent encounter: Secondary | ICD-10-CM | POA: Diagnosis not present

## 2020-10-19 DIAGNOSIS — T782XXD Anaphylactic shock, unspecified, subsequent encounter: Secondary | ICD-10-CM | POA: Diagnosis not present

## 2020-10-19 MED ORDER — AMLODIPINE BESYLATE 5 MG PO TABS
5.0000 mg | ORAL_TABLET | Freq: Every day | ORAL | 3 refills | Status: DC
  Filled 2020-10-19: qty 30, 30d supply, fill #0
  Filled 2020-10-19: qty 3, 3d supply, fill #0

## 2020-10-19 MED ORDER — HYDROXYZINE PAMOATE 25 MG PO CAPS
25.0000 mg | ORAL_CAPSULE | Freq: Three times a day (TID) | ORAL | 0 refills | Status: DC | PRN
  Filled 2020-10-19: qty 30, 10d supply, fill #0

## 2020-10-19 NOTE — Progress Notes (Signed)
Subjective:    Patient ID: Peter Camacho, male    DOB: 1961-01-30, 60 y.o.   MRN: [id number]  HPI  Pt in for follow up from hospital.  Admit date: 10/16/2020 Discharge date: 10/17/2020   Recommendations for Outpatient Follow-up:  Follow up with PCP in 1-2 weeks Continue Medrol Dosepak as directed Benadryl as needed for itching/swelling Famotidine 20 mg p.o. twice daily x7 days Discontinued lisinopril due to angioedema, now listed as allergy Prescription for EpiPen given Increase amlodipine to 10 mg p.o. daily Follow-up blood pressure is now off lisinopril   Discharge Condition: Stable CODE STATUS: Full code Diet recommendation: Heart healthy diet   History of present illness:   Peter Camacho is a 60 year old male with past medical history significant for essential hypertension, tobacco dependency, hard of hearing, and morbid obesity who initially presented to [facility] with the swelling of his lips.  Apparently this is his second anaphylactic reaction in 2 days.  He is currently on an ACE inhibitor with lisinopril.  He was given epinephrine x1 in the ED, started on Solu-Medrol, Pepcid, Benadryl and given IV fluids.  Given his significant angioedema, patient was transferred to [facility] for further observation and management for ACE inhibitor induced angioedema.   Hospital course:   ACE inhibitor induced angioedema Patient presenting to [facility] for itching to bilateral upper extremities, swelling to his lips.  Patient denied any difficulty breathing or swallowing.  Patient has prescribed an ACE inhibitor outpatient which was subsequently discontinued now.  Given epinephrine injection, IV Solu-Medrol, Pepcid, Benadryl and IV fluids in the ED.  Given his significant edema, EDP requested transfer to [facility] long hospital for further observation.  Patient continued on IV Solu-Medrol, IV Pepcid Benadryl as needed during hospitalization with  improvement of his oral swelling.  Patient never had any airway compromise and tolerating diet without issue.  Patient's edema much improved and ready for discharge home.  Will discharge home on Medrol Dosepak, p.o. Pepcid, Benadryl as needed.  Lisinopril has been discontinued.  Outpatient follow-up with PCP.  Epinephrine pen prescription given.   Essential hypertension Lisinopril discontinued as above.  Amlodipine increased to 10 mg p.o. daily.  New prescription for Colon placed.  Outpatient follow-up with PCP for further monitoring/adjustments.   Tobacco use disorder Counseled on need for complete cessation.  Outpatient follow-up with PCP.   Morbid obesity Body mass index is 39.63 kg/m.  Discussed with patient needs for aggressive lifestyle changes/weight loss as this complicates all facets of care.  Outpatient follow-up with PCP.  May benefit from bariatric evaluation outpatient.   Pt on discharge states did not get benadryl. He has forearm itching and hand itching. Same as on day of discharge. No wheezing, no sob or lipi swelling.  Hx of htn. His amlodipine rx was not filled either. Bp when I checked was 120/68. Second reading 115/68.   Pt thinks he may have had reaction to augmentin for finger infection. Hospital thought reaction to ace inh. Pt told to stop augmentin, zestoretic, meloxicam, SSD cream and stop triamcinolone.     Review of Systems  Constitutional:  Negative for chills, fatigue and fever.  Respiratory:  Negative for cough, chest tightness, shortness of breath and wheezing.   Cardiovascular:  Negative for chest pain and palpitations.  Gastrointestinal:  Negative for abdominal pain.  Genitourinary:  Negative for flank pain and frequency.  Skin:  Positive for rash.       Appear chronic  rash from former itching see exam.  Neurological:  Negative for dizziness, numbness and headaches.  Psychiatric/Behavioral:  Negative for confusion, dysphoric mood and sleep disturbance.  The patient is not nervous/anxious.     Past Medical History:  Diagnosis Date   Allergy    Asthma    At risk for sleep apnea    STOP-BANG= 5    SENT TO PCP 03-17-2014   GI bleed    Hard of hearing    History of seizure    HX menigitis x2  in middle and high school - residual seizure's on medications--  last seizure age 55   Hypertension    Obesity    Personal history of meningitis    X2  MIDDLE AND HIGH SCHOOL--  residual seizure's on medication -- last seizure age 32   Prostate cancer ([place]) MONITORED BY DR OTTELIN   stage T1c, Gleason 3+4, PSA 12.79, vol 38.5cc     Social History   Socioeconomic History   Marital status: Single    Spouse name: Not on file   Number of children: Not on file   Years of education: Not on file   Highest education level: Not on file  Occupational History   Occupation: metal heat treating  Tobacco Use   Smoking status: Former    Packs/day: 0.30    Years: 40.00    Pack years: 12.00    Types: Cigarettes   Smokeless tobacco: Never   Tobacco comments:    quit 11/11/16  Vaping Use   Vaping Use: Never used  Substance and Sexual Activity   Alcohol use: No   Drug use: No   Sexual activity: Not Currently    Birth control/protection: None  Other Topics Concern   Not on file  Social History Narrative   Not on file   Social Determinants of Health   Financial Resource Strain: Not on file  Food Insecurity: Not on file  Transportation Needs: Not on file  Physical Activity: Not on file  Stress: Not on file  Social Connections: Not on file  Intimate Partner Violence: Not on file    Past Surgical History:  Procedure Laterality Date   ESOPHAGOGASTRODUODENOSCOPY (EGD) WITH PROPOFOL N/A 08/10/2019   Procedure: ESOPHAGOGASTRODUODENOSCOPY (EGD) WITH PROPOFOL;  Surgeon: Ladene Artist, MD;  Location: [facility];  Service: Endoscopy;  Laterality: N/A;   HEMOSTASIS CONTROL  08/10/2019   Procedure: HEMOSTASIS CONTROL;  Surgeon: Ladene Artist,  MD;  Location: [facility];  Service: Endoscopy;;   HOT HEMOSTASIS N/A 08/10/2019   Procedure: HOT HEMOSTASIS (ARGON PLASMA COAGULATION/BICAP);  Surgeon: Ladene Artist, MD;  Location: [facility];  Service: Endoscopy;  Laterality: N/A;   PROSTATE BIOPSY     RADIOACTIVE SEED IMPLANT N/A 03/20/2014   Procedure: RADIOACTIVE SEED IMPLANT;  Surgeon: Claybon Jabs, MD;  Location: [facility];  Service: Urology;  Laterality: N/A;   SCLEROTHERAPY  08/10/2019   Procedure: SCLEROTHERAPY;  Surgeon: Ladene Artist, MD;  Location: [facility];  Service: Endoscopy;;    Family History  Problem Relation Age of Onset   CAD Mother 37   Diabetes Mother    CAD Father 35   Cancer Neg Hx     Allergies  Allergen Reactions   Ace Inhibitors Anaphylaxis   Fish-Derived Products Rash    Current Outpatient Medications on File Prior to Visit  Medication Sig Dispense Refill   acetaminophen (TYLENOL) 500 MG tablet Take 500 mg by mouth every 6 (six) hours as needed  for mild pain, moderate pain, fever or headache.     albuterol (VENTOLIN HFA) 108 (90 Base) MCG/ACT inhaler INHALE 2 PUFFS INTO THE LUNGS EVERY 6 HOURS AS NEEDED FOR WHEEZING OR SHORTNESS OF BREATH 8.5 each 1   EPINEPHrine 0.3 mg/0.3 mL IJ SOAJ injection Inject 0.3 mg into the muscle as needed for anaphylaxis. 1 each 0   famotidine (PEPCID) 20 MG tablet Take 1 tablet (20 mg total) by mouth 2 (two) times daily for 7 days. 14 tablet 0   hydrochlorothiazide (HYDRODIURIL) 25 MG tablet Take 1 tablet (25 mg total) by mouth daily. 30 tablet 2   methylPREDNISolone (MEDROL DOSEPAK) 4 MG TBPK tablet Use as directed 1 each 0   Multiple Vitamin (MULTIVITAMIN WITH MINERALS) TABS tablet Take 1 tablet by mouth daily.     No current facility-administered medications on file prior to visit.    BP 115/68   Pulse 100   Resp 20   Ht '6\' 2"'$  (1.88 m)   Wt (!) 313 lb (142 kg)   SpO2 98%   BMI 40.19 kg/m       Objective:   Physical  Exam  General- No acute distress. Pleasant patient. Neck- Full range of motion, no jvd Lungs- Clear, even and unlabored. Heart- regular rate and rhythm. Neurologic- CNII- XII grossly intact.  Heent- throat normal. No tongue swelling. No lip swelling.  Skin- forearm and hands. Some area of hyperpigmentation from former itching.        Assessment & Plan:   History of hypertension but today's blood pressure is on the lower side.  BP was checked twice.  Presently advising not to use amlodipine 5 mg daily.  Can continue HCTZ daily.  Check your blood pressure daily and if you have blood pressure readings over 140/90 then restart amlodipine.  Recent severe anaphylactic type reaction.  On review appears most likely cause was ACE inhibitor.  ACE inhibitor stopped.  Also stopped other medications advised by hospital.  Continue famotidine, Medrol and prescribing hydroxyzine in place of Benadryl.  Aware that pharmacy never filled the Benadryl prescription.  Rx advisement given regarding sedation side effects of hydroxyzine.  Referral to allergist placed.  Hopefully they will see you soon.  If you have any severe anaphylactic type reaction again then use EpiPen and return to emergency department.  Decided to go ahead and get CBC and CMP in light of your lower and blood pressure readings.  Evaluate if your dehydrated to some degree as well as check blood volume.  Follow-up in 1 week with PCP or myself if you are not scheduled to see allergist by then.  Time spent with patient today was  40 minutes which consisted of chart revdiew, discussing diagnosis, work up treatment and documentation.

## 2020-10-19 NOTE — Patient Instructions (Signed)
History of hypertension but today's blood pressure is on the lower side.  BP was checked twice.  Presently advising not to use amlodipine 5 mg daily.  Can continue HCTZ daily.  Check your blood pressure daily and if you have blood pressure readings over 140/90 then restart amlodipine.  Recent severe anaphylactic type reaction.  On review appears most likely cause was ACE inhibitor.  ACE inhibitor stopped.  Also stopped other medications advised by hospital.  Continue famotidine, Medrol and prescribing hydroxyzine in place of Benadryl.  Aware that pharmacy never filled the Benadryl prescription.  Rx advisement given regarding sedation side effects of hydroxyzine.  Referral to allergist placed.  Hopefully they will see you soon.  If you have any severe anaphylactic type reaction again then use EpiPen and return to emergency department.  Decided to go ahead and get CBC and CMP in light of your lower and blood pressure readings.  Evaluate if your dehydrated to some degree as well as check blood volume.  Follow-up in 1 week with PCP or myself if you are not scheduled to see allergist by then.

## 2020-10-19 NOTE — Telephone Encounter (Signed)
Can you get pt scheduled with allergist  within a week?

## 2020-10-20 ENCOUNTER — Telehealth: Payer: Self-pay

## 2020-10-20 ENCOUNTER — Other Ambulatory Visit (INDEPENDENT_AMBULATORY_CARE_PROVIDER_SITE_OTHER): Payer: 59

## 2020-10-20 ENCOUNTER — Encounter: Payer: Self-pay | Admitting: Family Medicine

## 2020-10-20 ENCOUNTER — Other Ambulatory Visit (HOSPITAL_BASED_OUTPATIENT_CLINIC_OR_DEPARTMENT_OTHER): Payer: Self-pay

## 2020-10-20 DIAGNOSIS — T7840XD Allergy, unspecified, subsequent encounter: Secondary | ICD-10-CM | POA: Diagnosis not present

## 2020-10-20 DIAGNOSIS — I9589 Other hypotension: Secondary | ICD-10-CM

## 2020-10-20 NOTE — Progress Notes (Signed)
Patient returning for repeat lab draw no charge and patient states he is taking steroids at this time last dose was this morning.

## 2020-10-20 NOTE — Addendum Note (Signed)
Addended by: Kelle Darting A on: 10/20/2020 12:15 PM   Modules accepted: Orders

## 2020-10-20 NOTE — Telephone Encounter (Signed)
Letter has been printed and given to pt.

## 2020-10-20 NOTE — Telephone Encounter (Signed)
Pt ask for a new work letter extending his absence (returning to work on 10/25/20). Previous letter was written by the Hospital.

## 2020-10-21 LAB — COMPREHENSIVE METABOLIC PANEL
ALT: 15 U/L (ref 0–53)
AST: 13 U/L (ref 0–37)
Albumin: 3.7 g/dL (ref 3.5–5.2)
Alkaline Phosphatase: 87 U/L (ref 39–117)
BUN: 23 mg/dL (ref 6–23)
CO2: 32 mEq/L (ref 19–32)
Calcium: 9.3 mg/dL (ref 8.4–10.5)
Chloride: 96 mEq/L (ref 96–112)
Creatinine, Ser: 1.06 mg/dL (ref 0.40–1.50)
GFR: 75.99 mL/min (ref >60.00)
Glucose, Bld: 87 mg/dL (ref 70–99)
Potassium: 3.8 mEq/L (ref 3.5–5.1)
Sodium: 137 mEq/L (ref 135–145)
Total Bilirubin: 0.3 mg/dL (ref 0.2–1.2)
Total Protein: 7 g/dL (ref 6.0–8.3)

## 2020-10-21 LAB — CBC WITH DIFFERENTIAL/PLATELET
Basophils Absolute: 0 10*3/uL (ref 0.0–0.1)
Basophils Relative: 0.3 % (ref 0.0–3.0)
Eosinophils Absolute: 0.1 10*3/uL (ref 0.0–0.7)
Eosinophils Relative: 1 % (ref 0.0–5.0)
HCT: 42.7 % (ref 39.0–52.0)
Hemoglobin: 14 g/dL (ref 13.0–17.0)
Lymphocytes Relative: 16.3 % (ref 12.0–46.0)
Lymphs Abs: 2.1 10*3/uL (ref 0.7–4.0)
MCHC: 32.8 g/dL (ref 30.0–36.0)
MCV: 91.1 fl (ref 78.0–100.0)
Monocytes Absolute: 0.8 10*3/uL (ref 0.1–1.0)
Monocytes Relative: 6.1 % (ref 3.0–12.0)
Neutro Abs: 9.7 10*3/uL — ABNORMAL HIGH (ref 1.4–7.7)
Neutrophils Relative %: 76.3 % (ref 43.0–77.0)
Platelets: 308 10*3/uL (ref 150.0–400.0)
RBC: 4.69 Mil/uL (ref 4.22–5.81)
RDW: 15.9 % — ABNORMAL HIGH (ref 11.5–15.5)
WBC: 12.7 10*3/uL — ABNORMAL HIGH (ref 4.0–10.5)

## 2020-11-15 ENCOUNTER — Other Ambulatory Visit: Payer: Self-pay | Admitting: Family Medicine

## 2020-11-15 DIAGNOSIS — I10 Essential (primary) hypertension: Secondary | ICD-10-CM

## 2020-11-29 NOTE — Progress Notes (Signed)
NEW PATIENT Date of Service/Encounter:  11/30/20 Referring provider: Elise Camacho Primary care provider: [name], DO  Subjective:  Peter Camacho is a 60 y.o. male with a PMHx of hpyertension and asthma presenting today for evaluation of allergic reaction. History obtained from: chart review and patient.   Per patient, a little over a month ago he woke up from sleep covered in hives with lip swelling.  He went to the emergency room that day and was treated with multiple medications, and his symptoms were starting to resolve.  However the next day he woke up again with hives.  Stated that he felt extremely dizzy but went to work.  His vision became blurry and he became sweating.  EMS was called and he was taking back to the emergency room.  He is not sure if he had any of his medications that morning.  He does think that he may be had salmon that morning for breakfast and he has a known seafood allergy to other fish and shellfish.  However he does tolerate salmon and tuna so these are the only fish he will occasionally eat.  His ACE inhibitor was stopped during his hospitalization as it was thought this was possibly the reason for his angioedema.  He has been taking ACE inhibitor for many years prior to these episodes. He is not sure what he had the night before his first reaction but believes possibly a hamburger.  He cannot remember many other details, but denies being on any new medications at the time of these events.  He is sure that he did not take any medications the morning of the first event.  He did finish an antibiotic at least 24 hours prior to the first episode. He does carry an epinephrine autoinjector at this time.  Chart review: Hospitalized on 10/16/20- Hives and angioedema with associated diaphoresis and vomiting with hypotension on 08/19 for which he was seen in ED and noted to be anxious with hives on exam, hypertensive, with wheezing requiring  prehospital epinephrine, steroids, Pepcid.  Sent home. Returning 08/20 with angioedema and was admitted overnight.  On ACE-I which was stopped during hospitalization. Given Epi, Solu-medrol, pepcid and benadryl Covid negative  Hx of shellfish and fish allergy.  Has avoided for years with the exception of tuna and salmon.  Believes he had hives and swelling and mouth itching in the past with other fish and shellfish he has been allergy tested years ago as he was a previous patient of this clinic.  Last visit over 30 years ago.  Also has a history of intermittent asthma.  Carries an albuterol inhaler which she reports using only about twice per week.  Never requires emergency department or urgent care visits for asthma. Also gets some nasal congestion mostly at night for which he uses Vicks VapoRub to help with nasal congestion.  Other allergy screening: Medication allergy: yes Hymenoptera allergy: no Urticaria: no Eczema:no   Past Medical History: Past Medical History:  Diagnosis Date   Allergy    Asthma    At risk for sleep apnea    STOP-BANG= 5    SENT TO PCP 03-17-2014   GI bleed    Hard of hearing    History of seizure    HX menigitis x2  in middle and high school - residual seizure's on medications--  last seizure age 58   Hypertension    Obesity    Personal history of meningitis    X2  MIDDLE AND HIGH SCHOOL--  residual seizure's on medication -- last seizure age 70   Prostate cancer (Peter Camacho) MONITORED BY DR OTTELIN   stage T1c, Gleason 3+4, PSA 12.79, vol 38.5cc   Medication List:  Current Outpatient Medications  Medication Sig Dispense Refill   acetaminophen (TYLENOL) 500 MG tablet Take 500 mg by mouth every 6 (six) hours as needed for mild pain, moderate pain, fever or headache.     albuterol (VENTOLIN HFA) 108 (90 Base) MCG/ACT inhaler INHALE 2 PUFFS INTO THE LUNGS EVERY 6 HOURS AS NEEDED FOR WHEEZING OR SHORTNESS OF BREATH 8.5 each 1   amLODipine (NORVASC) 5 MG tablet  Take 1 tablet (5 mg total) by mouth daily. 30 tablet 3   EPINEPHrine 0.3 mg/0.3 mL IJ SOAJ injection Inject 0.3 mg into the muscle as needed for anaphylaxis. 1 each 0   hydrochlorothiazide (HYDRODIURIL) 25 MG tablet Take 1 tablet (25 mg total) by mouth daily. 30 tablet 2   hydrOXYzine (VISTARIL) 25 MG capsule Take 1 capsule (25 mg total) by mouth every 8 (eight) hours as needed for itching. 30 capsule 0   methylPREDNISolone (MEDROL DOSEPAK) 4 MG TBPK tablet Use as directed 1 each 0   Multiple Vitamin (MULTIVITAMIN WITH MINERALS) TABS tablet Take 1 tablet by mouth daily.     famotidine (PEPCID) 20 MG tablet Take 1 tablet (20 mg total) by mouth 2 (two) times daily for 7 days. 14 tablet 0   No current facility-administered medications for this visit.   Known Allergies:  Allergies  Allergen Reactions   Ace Inhibitors Anaphylaxis   Fish-Derived Products Rash   Past Surgical History: Past Surgical History:  Procedure Laterality Date   ESOPHAGOGASTRODUODENOSCOPY (EGD) WITH PROPOFOL N/A 08/10/2019   Procedure: ESOPHAGOGASTRODUODENOSCOPY (EGD) WITH PROPOFOL;  Surgeon: Ladene Artist, MD;  Location: [facility];  Service: Endoscopy;  Laterality: N/A;   HEMOSTASIS CONTROL  08/10/2019   Procedure: HEMOSTASIS CONTROL;  Surgeon: Ladene Artist, MD;  Location: [facility];  Service: Endoscopy;;   HOT HEMOSTASIS N/A 08/10/2019   Procedure: HOT HEMOSTASIS (ARGON PLASMA COAGULATION/BICAP);  Surgeon: Ladene Artist, MD;  Location: [facility];  Service: Endoscopy;  Laterality: N/A;   PROSTATE BIOPSY     RADIOACTIVE SEED IMPLANT N/A 03/20/2014   Procedure: RADIOACTIVE SEED IMPLANT;  Surgeon: Claybon Jabs, MD;  Location: [facility];  Service: Urology;  Laterality: N/A;   SCLEROTHERAPY  08/10/2019   Procedure: SCLEROTHERAPY;  Surgeon: Ladene Artist, MD;  Location: [facility];  Service: Endoscopy;;   Family History: Family History  Problem Relation Age of Onset   CAD Mother 42    Diabetes Mother    CAD Father 25   Cancer Neg Hx    Social History: Peter Camacho lives in a home without water damage, area rugs, gas heating, central AC, no pets, no visible cockroaches, using dust mite protection for bed but not pillows, works in [employer] for 21 years.  Reports intermittent smoking on and off for years when he is stressed but unable to give exact pack per day count.  ROS:  All other systems negative except as noted per HPI.  Objective:  Blood pressure (!) 146/86, pulse 86, temperature 98.4 F (36.9 C), temperature source Temporal, resp. rate 18, height 6\' 3"  (1.905 m), weight (!) 322 lb 6.4 oz (146.2 kg), SpO2 97 %. Body mass index is 40.3 kg/m. Physical Exam:  General Appearance:  Alert, cooperative, no distress, appears stated age  Head:  Normocephalic, without  obvious abnormality, atraumatic  Eyes:  Conjunctiva clear, EOM's intact  Nose: Nares normal  Throat: Lips, tongue normal; teeth and gums normal, normal posterior oropharynx  Neck: Supple, symmetrical  Lungs:   Clear to auscultation bilaterally respirations unlabored, no coughing  Heart:  Regular rate and rhythm, no murmur, appears well perfused  Extremities: No edema  Skin: Skin color, texture, turgor normal, no rashes or lesions on visualized portions of skin  Neurologic: No gross deficits   Diagnostics: Spirometry:  Tracings reviewed. His effort: It was hard to get consistent efforts and there is a question as to whether this reflects a maximal maneuver. FVC: 2.86L FEV1: 1.92L, 55% predicted FEV1/FVC ratio: 88% Interpretation: Spirometry consistent with mixed obstructive and restrictive disease.  Please see scanned spirometry results for details.  Skin Testing: Environmental allergy panel and select foods. Positive test to: Elgie Congo, Trout, tuna, salmon, flounder, codfish, shrimp in addition to grasses, weeds, trees, molds, dust mites, cockroach, mouse, tobacco, mixed feathers, horse.  Negative test to: Cat fish, crab, lobster, oyster, scallops, pork, beef, lamb.  Results discussed with patient/family.  Airborne Adult Perc - 11/30/20 1133     Time Antigen Placed 1044    Allergen Manufacturer Greer    Location Back    Number of Test 59    Panel 1 Select    1. Control-Buffer 50% Glycerol Negative    2. Control-Histamine 1 mg/ml 3+    3. Albumin saline Negative    4. [place] 3+    5. [place] 3+    6. Johnson 3+    7. [place] Blue Negative    8. Meadow Fescue Negative    9. Perennial Rye Negative    10. Sweet Vernal Negative    11. Timothy 3+    12. Cocklebur 3+    13. Burweed Marshelder Negative    14. Ragweed, short Negative    15. Ragweed, Giant Negative    16. Plantain,  English 3+    17. Lamb's Quarters 3+    18. Sheep Sorrell 3+    19. Rough Pigweed 3+    20. Marsh Elder, Rough Negative    21. Mugwort, Common Negative    22. Ash mix 4+    23. Birch mix 3+    24. Beech American 4+    25. Box, Elder 3+    26. Cedar, red Negative    27. Cottonwood, Eastern 3+    28. Elm mix 3+    29. Hickory 3+    30. Maple mix 3+    31. Oak, [place] mix 4+    32. Pecan Pollen 4+    33. Pine mix 4+    34. Sycamore Eastern 3+    35. [place], Black Pollen 4+    36. Alternaria alternata Negative    37. Cladosporium Herbarum Negative    38. Aspergillus mix Negative    39. Penicillium mix Negative    40. Bipolaris sorokiniana (Helminthosporium) Negative    41. Drechslera spicifera (Curvularia) Negative    42. Mucor plumbeus Negative    43. Fusarium moniliforme Negative    44. Aureobasidium pullulans (pullulara) 3+    45. Rhizopus oryzae 3+    46. Botrytis cinera Negative    47. Epicoccum nigrum Negative    48. Phoma betae Negative    49. Candida Albicans Negative    50. Trichophyton mentagrophytes Negative    51. Mite, D Farinae  5,000 AU/ml 4+    52. Mite, D Pteronyssinus  5,000 AU/ml 3+  53. Cat Hair 10,000 BAU/ml Negative    54.  Dog Epithelia Negative     55. Mixed Feathers 3+    56. Horse Epithelia 3+    57. Cockroach, German 3+    58. Mouse 4+    59. Tobacco Leaf 3+             Food Adult Perc - 11/30/20 1100     Time Antigen Placed 1045    Allergen Manufacturer Greer    Location Back    Number of allergen test 15    18. Catfish Negative    19. Bass --   5*9   20. Trout --   4*7   21. Tuna --   3*6   22. Salmon --   3*6   23. Flounder --   4*7   24. Codfish --   10*27   25. Shrimp --   8*15   26. Crab Negative    27. Lobster Negative    28. Oyster Negative    29. Scallops Negative    37. Pork Negative    40. Beef Negative    41. Lamb Negative             Allergy testing results were read and interpreted by myself, documented by clinical staff.  Assessment:  Anaphylaxis, initial encounter - Plan: Alpha-Gal Panel, Tryptase, Allergy Test History concerning for anaphylaxis.  Etiology unclear at this time.  Suspect possible alpha gal based on history.  He had 2 days of back-to-back symptoms in between which he did eat salmon which was positive on today's exam.  It is possible that this was responsible for day 2 of symptoms but he does not think he had salmon on the first day.  Advised of avoidance of both salmon and tuna based on today's results as well as all other seafood.  Will screen for alpha gal allergy as well as mast cell disorders.  His symptoms were not consistent with bradykinin mediated angioedema which is the most common source of ACE inhibitor induced angioedema.  Therefore doubt any allergy to ACE inhibitor's and he may resume this medication if needed for blood pressure based on today's history.  Mild intermittent asthma without complication - Plan: Spirometry with Graph Lung function testing today did not look good and showed a mixed restrictive and obstructive pattern.  He does have a smoking history of intermittent smoking.  His effort was not great so we did not attempt a post bronchodilator spirometry.   He reports that his breathing is well, so we will not make changes to his medications at this date.  However at follow-up would like to get pre and post bronchodilator and if his FEV1 continues to be low may consider using intermittent ICS/LABA.  Chronic rhinitis Seasonal perennial allergic based on today's SPT results.  Management as below. Plan/Recommendations:   Patient Instructions  Anaphylaxis- unclear cause - allergy testing was positive to [employer], tuna, salmon, flounder, codfish, shrimp -Please practice strict avoidance of all seafood at this time -Labs today for histamine causes: tryptase, alpha gal panel (red meat allergy) - please avoid mammalian meats (pork, beef, venison, etc) until your alpha gal panel returns - Epipen to be injected in your outer thigh if concern for airway involvement (throat closing, difficulty breathing, etc) - if you feel airway involvement, please call 911 and seek emergency medical care - we will contact you with results - do not suspect your reaction was due to your blood pressure  medicine-okay to restart this if needed  Chronic Rhinitis - perennial and seasonal: - allergy testing today was positive to grasses, weeds, trees, molds, dust mites, horse, cockroach, mouse, tobacco leaf - allergen avoidance as below - Start Nasal Steroid Spray: Options include Flonase (fluticasone), Nasocort (triamcinolone), Nasonex (mometasome) 1- 2 sprays in each nostril daily (can buy over-the-counter if not covered by insurance)  Best results if used daily. - Consider antihistamine daily or daily as needed.  Your options include Zyrtec (Cetirizine) 10mg , Claritin (Loratadine) 10mg , Allegra (Fexofenadine) 180mg , or Xyzal (Levocetirinze) 5mg   Asthma: - lung testing today did not look great but in part was due to effort - let's repeat at next visit, please keep track of how often you feel short of breath - Use Albuterol (Proair/Ventolin) 2 puffs every 4-6 hours as  needed for chest tightness, wheezing, or coughing - Use Albuterol (Proair/Ventolin) 2 puffs 15 minutes prior to exercise if you have symptoms with activity - Use a spacer with all inhalers - please keep track of how often you are needing rescue inhaler Albuterol (Proair/Ventolin) as this will help guide future management - Asthma is not controlled if:  - Symptoms are occurring >2 times a week OR  - >2 times a month nighttime awakenings  - Please call the clinic to schedule a follow up if these symptoms arise   Follow-up in 2 months  This note in its entirety was forwarded to the Provider who requested this consultation.  Thank you for your kind referral. I appreciate the opportunity to take part in Taite's care. Please do not hesitate to contact me with questions.  Sincerely,  Sigurd Sos, MD [facility] [place] [facility]

## 2020-11-30 ENCOUNTER — Encounter: Payer: Self-pay | Admitting: Internal Medicine

## 2020-11-30 ENCOUNTER — Other Ambulatory Visit: Payer: Self-pay

## 2020-11-30 ENCOUNTER — Ambulatory Visit: Payer: 59 | Admitting: Internal Medicine

## 2020-11-30 VITALS — BP 146/86 | HR 86 | Temp 98.4°F | Resp 18 | Ht 75.0 in | Wt 322.4 lb

## 2020-11-30 DIAGNOSIS — T782XXA Anaphylactic shock, unspecified, initial encounter: Secondary | ICD-10-CM

## 2020-11-30 DIAGNOSIS — J3089 Other allergic rhinitis: Secondary | ICD-10-CM

## 2020-11-30 DIAGNOSIS — T7840XA Allergy, unspecified, initial encounter: Secondary | ICD-10-CM | POA: Diagnosis not present

## 2020-11-30 DIAGNOSIS — J302 Other seasonal allergic rhinitis: Secondary | ICD-10-CM | POA: Diagnosis not present

## 2020-11-30 DIAGNOSIS — J4520 Mild intermittent asthma, uncomplicated: Secondary | ICD-10-CM | POA: Insufficient documentation

## 2020-11-30 NOTE — Patient Instructions (Addendum)
Anaphylaxis- unclear cause - allergy testing was positive to [employer], tuna, salmon, flounder, codfish, shrimp -Please practice strict avoidance of all seafood at this time -Labs today for histamine causes: tryptase, alpha gal panel (red meat allergy) - please avoid mammalian meats (pork, beef, venison, etc) until your alpha gal panel returns - Epipen to be injected in your outer thigh if concern for airway involvement (throat closing, difficulty breathing, etc) - if you feel airway involvement, please call 911 and seek emergency medical care - we will contact you with results - do not suspect your reaction was due to your blood pressure medicine-okay to restart this if needed  Chronic Rhinitis - perennial and seasonal: - allergy testing today was positive to grasses, weeds, trees, molds, dust mites, horse, cockroach, mouse, tobacco leaf - allergen avoidance as below - Start Nasal Steroid Spray: Options include Flonase (fluticasone), Nasocort (triamcinolone), Nasonex (mometasome) 1- 2 sprays in each nostril daily (can buy over-the-counter if not covered by insurance)  Best results if used daily. - Consider antihistamine daily or daily as needed.  Your options include Zyrtec (Cetirizine) 10mg , Claritin (Loratadine) 10mg , Allegra (Fexofenadine) 180mg , or Xyzal (Levocetirinze) 5mg   Asthma: - lung testing today did not look great but in part was due to effort - let's repeat at next visit, please keep track of how often you feel short of breath - Use Albuterol (Proair/Ventolin) 2 puffs every 4-6 hours as needed for chest tightness, wheezing, or coughing - Use Albuterol (Proair/Ventolin) 2 puffs 15 minutes prior to exercise if you have symptoms with activity - Use a spacer with all inhalers - please keep track of how often you are needing rescue inhaler Albuterol (Proair/Ventolin) as this will help guide future management - Asthma is not controlled if:  - Symptoms are occurring >2 times a week  OR  - >2 times a month nighttime awakenings  - Please call the clinic to schedule a follow up if these symptoms arise  Reducing Pollen Exposure  The American Academy of Allergy, Asthma and Immunology suggests the following steps to reduce your exposure to pollen during allergy seasons.    Do not hang sheets or clothing out to dry; pollen may collect on these items. Do not mow lawns or spend time around freshly cut grass; mowing stirs up pollen. Keep windows closed at night.  Keep car windows closed while driving. Minimize morning activities outdoors, a time when pollen counts are usually at their highest. Stay indoors as much as possible when pollen counts or humidity is high and on windy days when pollen tends to remain in the air longer. Use air conditioning when possible.  Many air conditioners have filters that trap the pollen spores. Use a HEPA room air filter to remove pollen form the indoor air you breathe.  DUST MITE AVOIDANCE MEASURES:  There are three main measures that need and can be taken to avoid house dust mites:  Reduce accumulation of dust in general -reduce furniture, clothing, carpeting, books, stuffed animals, especially in bedroom  Separate yourself from the dust -use pillow and mattress encasements (can be found at stores such as [employer] or online) -avoid direct exposure to air condition flow -use a HEPA filter device, especially in the bedroom; you can also use a HEPA filter vacuum cleaner -wipe dust with a moist towel instead of a dry towel or broom when cleaning  Decrease mites and/or their secretions -wash clothing and linen and stuffed animals at highest temperature possible, at least  every 2 weeks -stuffed animals can also be placed in a bag and put in a freezer overnight  Despite the above measures, it is impossible to eliminate dust mites or their allergen completely from your home.  With the above measures the burden of mites in your home  can be diminished, with the goal of minimizing your allergic symptoms.  Success will be reached only when implementing and using all means together.  Control of Mold Allergen   Mold and fungi can grow on a variety of surfaces provided certain temperature and moisture conditions exist.  Outdoor molds grow on plants, decaying vegetation and soil.  The major outdoor mold, Alternaria and Cladosporium, are found in very high numbers during hot and dry conditions.  Generally, a late Summer - Fall peak is seen for common outdoor fungal spores.  Rain will temporarily lower outdoor mold spore count, but counts rise rapidly when the rainy period ends.  The most important indoor molds are Aspergillus and Penicillium.  Dark, humid and poorly ventilated basements are ideal sites for mold growth.  The next most common sites of mold growth are the bathroom and the kitchen.  Outdoor (Seasonal) Mold Control   Use air conditioning and keep windows closed Avoid exposure to decaying vegetation. Avoid leaf raking. Avoid grain handling. Consider wearing a face mask if working in moldy areas.   Indoor molds:   Maintain humidity below 50%. Clean washable surfaces with 5% bleach solution. Remove sources e.g. contaminated carpets.  Control of Cockroach Allergen  Cockroach allergen has been identified as an important cause of acute attacks of asthma, especially in urban settings.  There are fifty-five species of cockroach that exist in the [place], however only three, the [place], [place] species produce allergen that can affect patients with Asthma.  Allergens can be obtained from fecal particles, egg casings and secretions from cockroaches.    Remove food sources. Reduce access to water. Seal access and entry points. Spray runways with 0.5-1% Diazinon or Chlorpyrifos Blow boric acid power under stoves and refrigerator. Place bait stations (hydramethylnon) at feeding sites.

## 2020-12-01 ENCOUNTER — Other Ambulatory Visit: Payer: Self-pay | Admitting: Family Medicine

## 2020-12-01 DIAGNOSIS — J302 Other seasonal allergic rhinitis: Secondary | ICD-10-CM

## 2020-12-07 LAB — ALPHA-GAL PANEL
Allergen Lamb IgE: <0.1 kU/L
Beef IgE: <0.1 kU/L
IgE (Immunoglobulin E), Serum: 276 IU/mL (ref 6–495)
O215-IgE Alpha-Gal: <0.1 kU/L
Pork IgE: <0.1 kU/L

## 2020-12-07 LAB — TRYPTASE: Tryptase: 7.9 ug/L (ref 2.2–13.2)

## 2020-12-08 NOTE — Progress Notes (Signed)
Please let Peter Camacho know that the labs treated for mammalian meat allergy ER negative so he can continue eating meat.  His other labs are reassuring.

## 2020-12-19 IMAGING — CR DG HIP (WITH OR WITHOUT PELVIS) 2-3V*R*
3 series · 3 of 3 positions shown · non-contrast
Comparison: None.

CLINICAL DATA: Right groin pain. Right hip pain for 2 months. No
known injury.

EXAM:
DG HIP (WITH OR WITHOUT PELVIS) 2-3V RIGHT

[t pelvis a.p.]
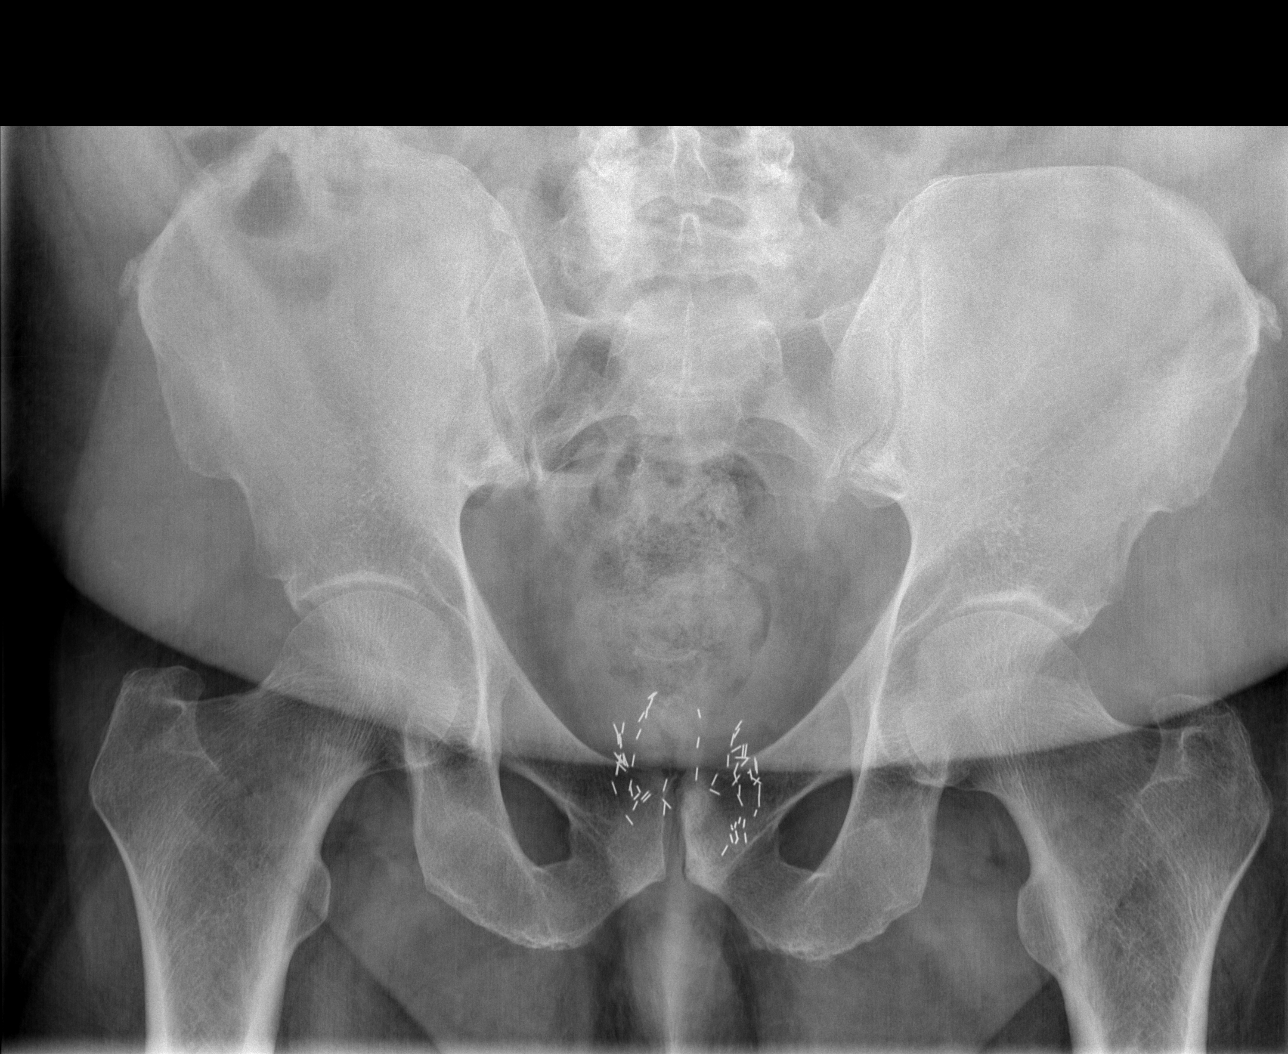

[t hip ap right]
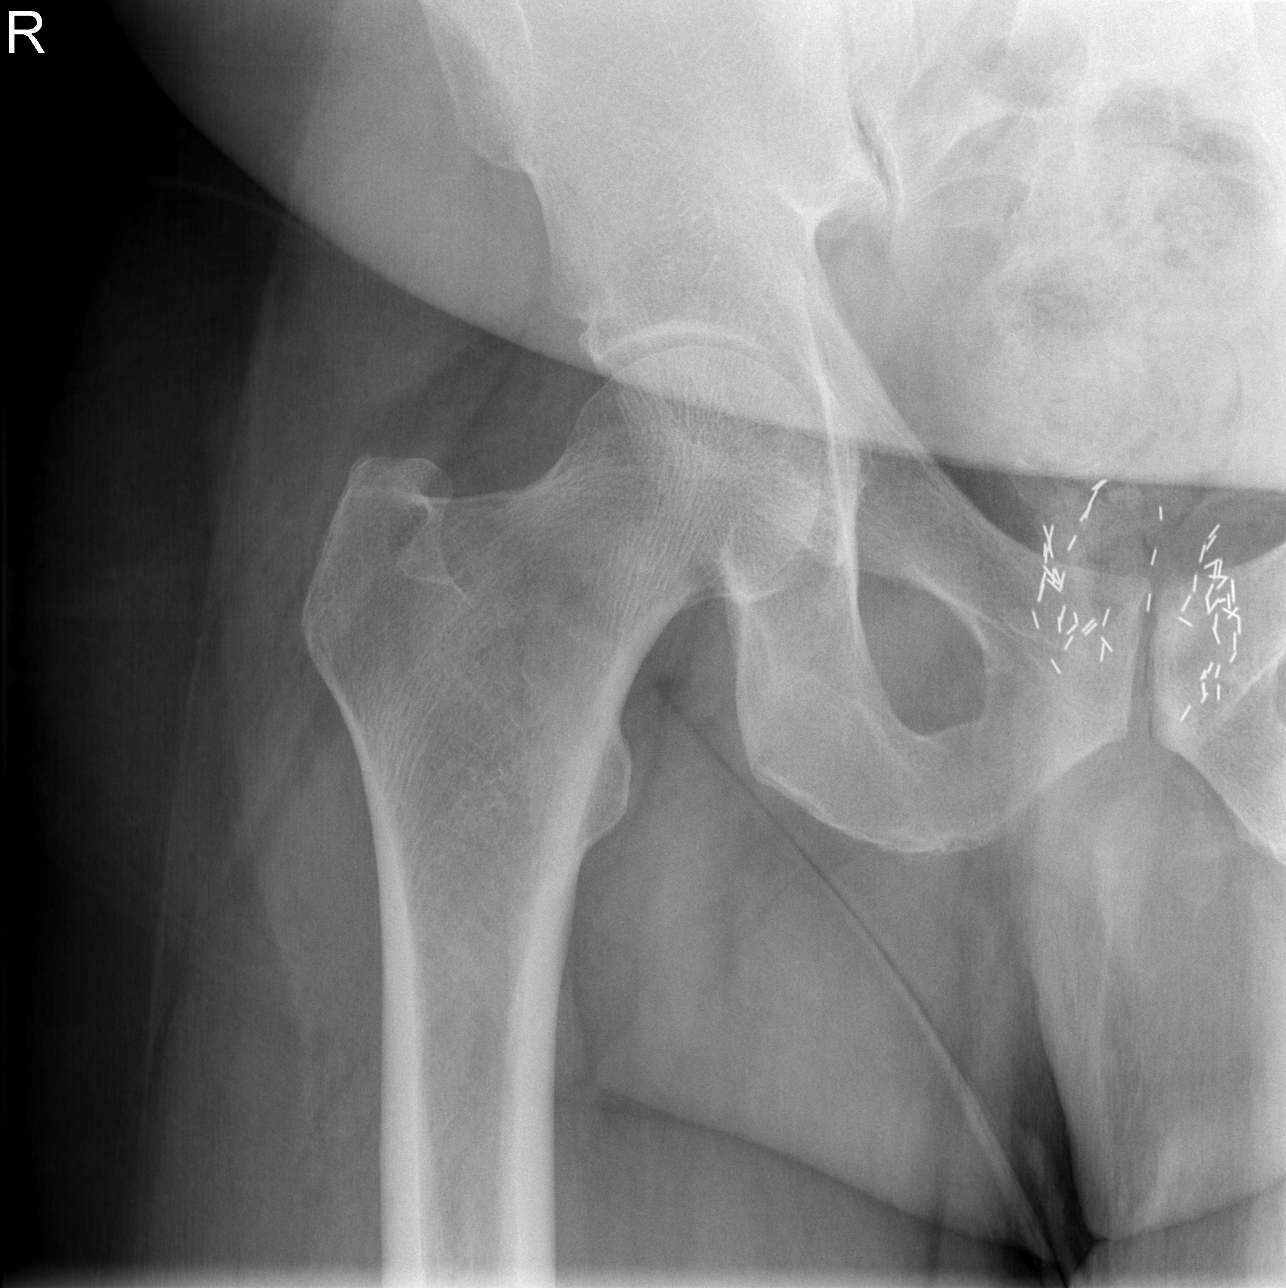

[t hip frog leg right]
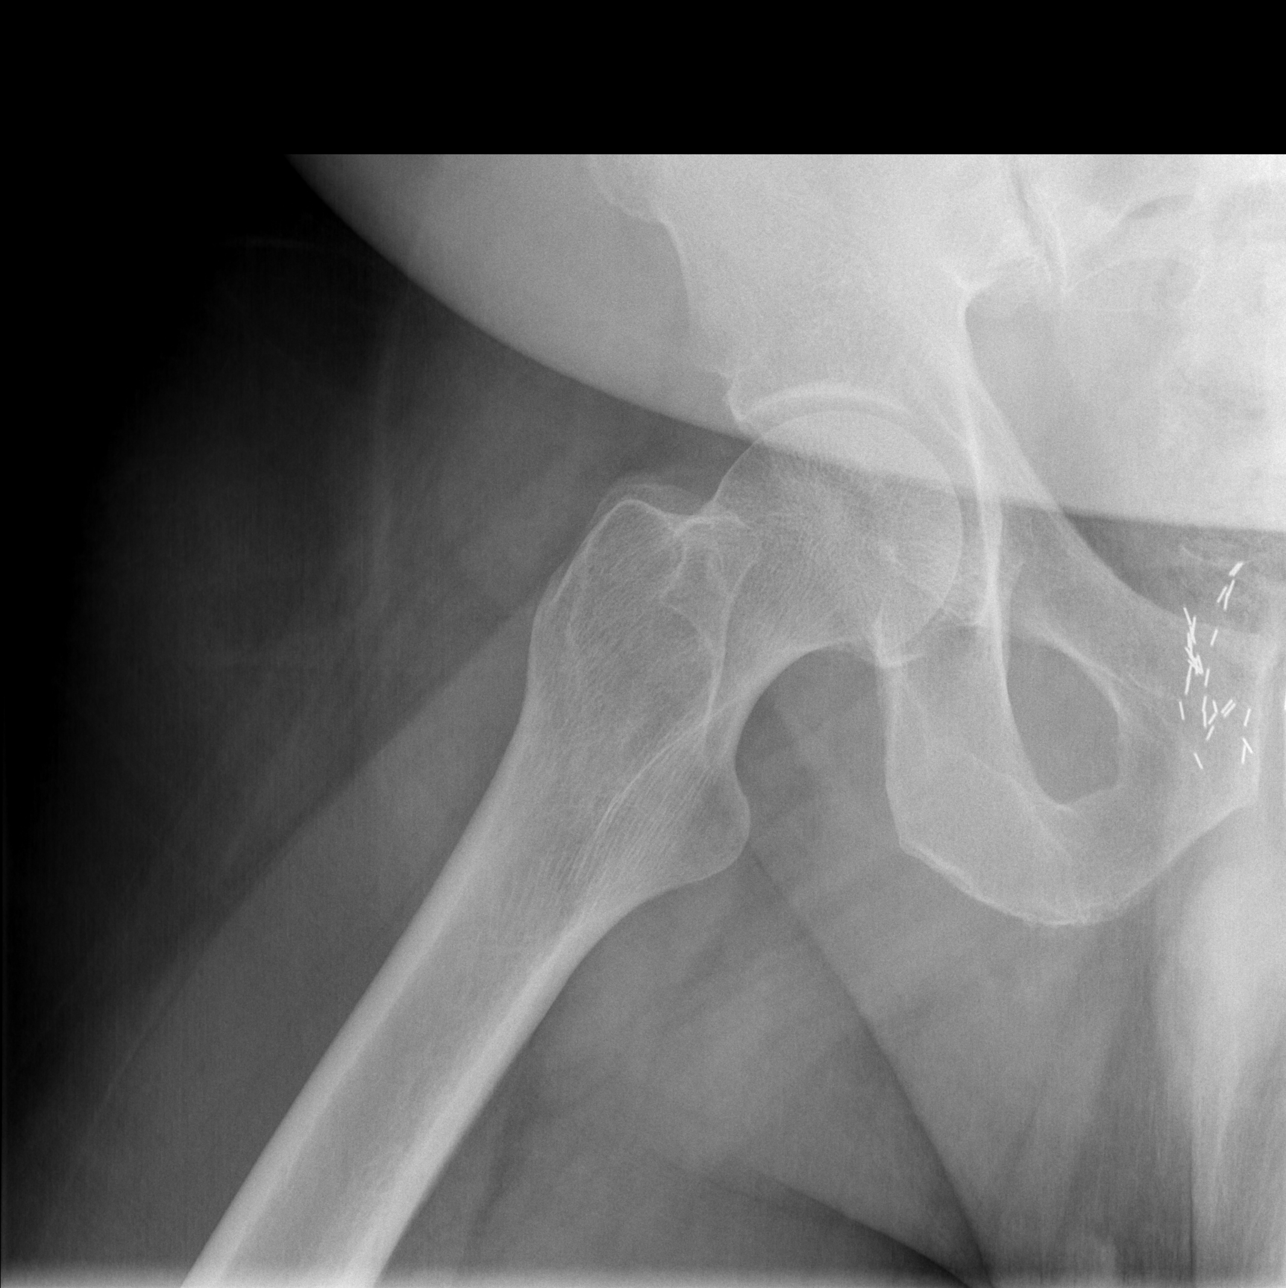

[3 of 3 positions shown; findings below may reference images not displayed]

FINDINGS: There is no evidence of hip fracture or dislocation. There is no
evidence of arthropathy or other focal bone abnormality.
IMPRESSION: Negative.

## 2020-12-30 ENCOUNTER — Telehealth: Payer: Self-pay | Admitting: Internal Medicine

## 2020-12-30 NOTE — Telephone Encounter (Signed)
Dr. Simona Huh did advise this inhaler was the Air Duo Digihaler there was one available  and was set aside at the front desk Patient notified

## 2020-12-30 NOTE — Telephone Encounter (Signed)
Patient didn't know the name of the inhaler you wanted him to have a sample of. Please advise which inhaler he was talking about and I will see if a sample is available. Thanks

## 2020-12-30 NOTE — Telephone Encounter (Signed)
Patient states on his last visit with Dr. Simona Huh she stated she would give him a sample inhaler but at that time there was none in stock she would give him one when they came in patient is asking if he can now get an inhaler please advise

## 2021-01-26 ENCOUNTER — Other Ambulatory Visit (HOSPITAL_BASED_OUTPATIENT_CLINIC_OR_DEPARTMENT_OTHER): Payer: Self-pay

## 2021-01-26 ENCOUNTER — Other Ambulatory Visit: Payer: Self-pay | Admitting: Family Medicine

## 2021-01-26 ENCOUNTER — Telehealth: Payer: Self-pay | Admitting: Family Medicine

## 2021-01-26 DIAGNOSIS — I10 Essential (primary) hypertension: Secondary | ICD-10-CM

## 2021-01-26 MED ORDER — HYDROCHLOROTHIAZIDE 25 MG PO TABS
25.0000 mg | ORAL_TABLET | Freq: Every day | ORAL | 2 refills | Status: DC
  Filled 2021-01-26: qty 30, 30d supply, fill #0

## 2021-01-26 NOTE — Telephone Encounter (Signed)
Pt. Called in and stated he needs a refill on his blood pressure medication.  [id number] - [place], [place] [address]  [address] [place] [address]  Phone:  [phone]  Fax:  [phone]

## 2021-01-26 NOTE — Telephone Encounter (Signed)
Medication:  hydrochlorothiazide (hydrodiuril) 25 MG tablet     Has the patient contacted their pharmacy? No. (If no, request that the patient contact the pharmacy for the refill.) (If yes, when and what did the pharmacy advise?)     Preferred Pharmacy (with phone number or street name):  [facility] [address] [place]  [address] [phone]     Agent: Please be advised that RX refills may take up to 3 business days. We ask that you follow-up with your pharmacy.

## 2021-02-16 ENCOUNTER — Other Ambulatory Visit: Payer: Self-pay | Admitting: Family Medicine

## 2021-02-16 DIAGNOSIS — I10 Essential (primary) hypertension: Secondary | ICD-10-CM

## 2021-05-16 ENCOUNTER — Other Ambulatory Visit: Payer: Self-pay | Admitting: Family Medicine

## 2021-05-16 DIAGNOSIS — I10 Essential (primary) hypertension: Secondary | ICD-10-CM

## 2021-05-26 ENCOUNTER — Other Ambulatory Visit: Payer: Self-pay | Admitting: Family Medicine

## 2021-06-16 ENCOUNTER — Other Ambulatory Visit: Payer: Self-pay | Admitting: Family Medicine

## 2021-06-16 DIAGNOSIS — I10 Essential (primary) hypertension: Secondary | ICD-10-CM

## 2021-07-01 ENCOUNTER — Encounter: Payer: Self-pay | Admitting: Family Medicine

## 2021-07-01 ENCOUNTER — Other Ambulatory Visit (HOSPITAL_BASED_OUTPATIENT_CLINIC_OR_DEPARTMENT_OTHER): Payer: Self-pay

## 2021-07-01 ENCOUNTER — Ambulatory Visit: Payer: BLUE CROSS/BLUE SHIELD | Admitting: Family Medicine

## 2021-07-01 VITALS — BP 120/81 | HR 84 | Temp 98.2°F | Ht 74.5 in | Wt 310.1 lb

## 2021-07-01 DIAGNOSIS — G8929 Other chronic pain: Secondary | ICD-10-CM | POA: Diagnosis not present

## 2021-07-01 DIAGNOSIS — M5441 Lumbago with sciatica, right side: Secondary | ICD-10-CM | POA: Diagnosis not present

## 2021-07-01 DIAGNOSIS — Z6835 Body mass index (BMI) 35.0-35.9, adult: Secondary | ICD-10-CM

## 2021-07-01 DIAGNOSIS — J302 Other seasonal allergic rhinitis: Secondary | ICD-10-CM | POA: Diagnosis not present

## 2021-07-01 MED ORDER — METHOCARBAMOL 750 MG PO TABS
750.0000 mg | ORAL_TABLET | Freq: Three times a day (TID) | ORAL | 1 refills | Status: DC | PRN
  Filled 2021-07-01: qty 60, 20d supply, fill #0

## 2021-07-01 MED ORDER — ALBUTEROL SULFATE HFA 108 (90 BASE) MCG/ACT IN AERS
2.0000 | INHALATION_SPRAY | Freq: Four times a day (QID) | RESPIRATORY_TRACT | 5 refills | Status: DC | PRN
  Filled 2021-07-01: qty 6.7, 25d supply, fill #0

## 2021-07-01 MED ORDER — METHYLPREDNISOLONE ACETATE 80 MG/ML IJ SUSP
80.0000 mg | Freq: Once | INTRAMUSCULAR | Status: AC
  Administered 2021-07-01: 80 mg via INTRAMUSCULAR

## 2021-07-01 NOTE — Progress Notes (Signed)
Musculoskeletal Exam ? ?Patient: Peter Camacho DOB: September 22, 1960 ? ?DOS: 07/01/2021 ? ?SUBJECTIVE: ? ?Chief Complaint:  ? ?Chief Complaint  ?Patient presents with  ? Back Pain  ?  Needs a note for work  ? ? ?Peter Camacho is a 61 y.o.  male for evaluation and treatment of back pain.  ? ?Onset:  several  years  ago. No inj or change in activity.  ?Location: lower ?Character:  sharp and stabbing  ?Progression of issue:  much worse over past 2 d, no inj for this either ?Associated symptoms: worse when extending back, radiates down RLE ?No bruising, redness, swelling.  ?Denies bowel/bladder incontinence or weakness ?Treatment: to date has been acetaminophen; failed PT and chiropractor.   ?Neurovascular symptoms: no ? ?Past Medical History:  ?Diagnosis Date  ? Allergy   ? Asthma   ? At risk for sleep apnea   ? STOP-BANG= 5    SENT TO PCP 03-17-2014  ? GI bleed   ? Hard of hearing   ? History of seizure   ? HX menigitis x2  in middle and high school - residual seizure's on medications--  last seizure age 22  ? Hypertension   ? Obesity   ? Personal history of meningitis   ? X2  MIDDLE AND HIGH SCHOOL--  residual seizure's on medication -- last seizure age 38  ? Prostate cancer ([place]) MONITORED BY DR Karsten Ro  ? stage T1c, Gleason 3+4, PSA 12.79, vol 38.5cc  ? ? ?Objective: ? ?VITAL SIGNS: BP 120/81   Pulse 84   Temp 98.2 ?F (36.8 ?C) (Oral)   Ht 6' 2.5" (1.892 m)   Wt (!) 310 lb 2 oz (140.7 kg)   SpO2 98%   BMI 39.29 kg/m?  ?Constitutional: Well formed, well developed. No acute distress. ?HENT: Normocephalic, atraumatic.  ?Thorax & Lungs:  No accessory muscle use ?Musculoskeletal: low back.   ?Tenderness to palpation: no ?Deformity: no ?Ecchymosis: no ?Straight leg test: negative for ?Poor hamstring flexibility b/l. ?Neurologic: Normal sensory function. No focal deficits noted. DTR's equal and symmetric in LE's. No clonus. 5/5 strength in LE's b/l.  ?Psychiatric: Normal mood. Age appropriate judgment and insight. Alert &  oriented x 3.   ? ?Assessment: ? ?Chronic right-sided low back pain with right-sided sciatica - Plan: methocarbamol (ROBAXIN-750) 750 MG tablet, MR Lumbar Spine Wo Contrast, Ambulatory referral to Physical Medicine Rehab ? ?Seasonal allergies - Plan: albuterol (VENTOLIN HFA) 108 (90 Base) MCG/ACT inhaler ? ?Plan: ?1. Exacerbation of chronic issue.  Will refer to physical medicine and rehabilitation for discussion of possible injections.  Check lumbar spine MRI.  Trial methocarbamol as needed.  Stretches/exercises, heat, ice, Tylenol.  He is wary of using NSAIDs due to history of a gastric ulcer. ?2.  Allergy induced asthma, continue albuterol as needed. ?F/u in 1 month to recheck #1 and have a physical. ?The patient voiced understanding and agreement to the plan. ? ? ?[place], DO ?07/01/21  ?9:40 AM ? ?

## 2021-07-01 NOTE — Patient Instructions (Addendum)
Heat (pad or rice pillow in microwave) over affected area, 10-15 minutes twice daily.  ? ?Ice/cold pack over area for 10-15 min twice daily. ? ?OK to take Tylenol 1000 mg (2 extra strength tabs) or 975 mg (3 regular strength tabs) every 6 hours as needed. ? ?If you do not hear anything about your referral in the next 1-2 weeks, call our office and ask for an update. ? ?Keep doing the stretches. ? ?Let us know if you need anything. ?

## 2021-07-06 ENCOUNTER — Telehealth (HOSPITAL_BASED_OUTPATIENT_CLINIC_OR_DEPARTMENT_OTHER): Payer: Self-pay

## 2021-07-07 ENCOUNTER — Telehealth (HOSPITAL_BASED_OUTPATIENT_CLINIC_OR_DEPARTMENT_OTHER): Payer: Self-pay

## 2021-07-12 ENCOUNTER — Telehealth (HOSPITAL_BASED_OUTPATIENT_CLINIC_OR_DEPARTMENT_OTHER): Payer: Self-pay

## 2021-07-14 ENCOUNTER — Other Ambulatory Visit: Payer: Self-pay | Admitting: Family Medicine

## 2021-07-14 DIAGNOSIS — J302 Other seasonal allergic rhinitis: Secondary | ICD-10-CM

## 2021-10-04 ENCOUNTER — Other Ambulatory Visit: Payer: Self-pay | Admitting: Family Medicine

## 2021-10-04 DIAGNOSIS — J302 Other seasonal allergic rhinitis: Secondary | ICD-10-CM

## 2021-10-28 ENCOUNTER — Other Ambulatory Visit: Payer: Self-pay | Admitting: Family Medicine

## 2021-10-28 DIAGNOSIS — I10 Essential (primary) hypertension: Secondary | ICD-10-CM

## 2021-11-08 ENCOUNTER — Encounter: Payer: Self-pay | Admitting: Family Medicine

## 2021-11-08 ENCOUNTER — Other Ambulatory Visit (HOSPITAL_BASED_OUTPATIENT_CLINIC_OR_DEPARTMENT_OTHER): Payer: Self-pay

## 2021-11-08 ENCOUNTER — Ambulatory Visit (INDEPENDENT_AMBULATORY_CARE_PROVIDER_SITE_OTHER): Payer: BLUE CROSS/BLUE SHIELD | Admitting: Family Medicine

## 2021-11-08 VITALS — BP 130/80 | HR 104 | Temp 98.5°F | Ht 74.0 in | Wt 299.0 lb

## 2021-11-08 DIAGNOSIS — K529 Noninfective gastroenteritis and colitis, unspecified: Secondary | ICD-10-CM | POA: Diagnosis not present

## 2021-11-08 MED ORDER — ONDANSETRON 4 MG PO TBDP
4.0000 mg | ORAL_TABLET | Freq: Three times a day (TID) | ORAL | 0 refills | Status: DC | PRN
  Filled 2021-11-08: qty 20, 7d supply, fill #0

## 2021-11-08 NOTE — Patient Instructions (Addendum)
Push fluids.  As long as you are vomiting or having diarrhea, please drink fluids with electrolytes like Gatorade, Powerade, or Pedialyte (if you can stomach the taste). Drink these along with water mainly.   Please let me know if this takes a turn for the worse.   Let us know if you need anything.

## 2021-11-08 NOTE — Progress Notes (Signed)
Chief Complaint  Patient presents with   Nausea    Diarrhea after eating at [employer].       Subjective Peter Camacho is a 61 y.o. male who presents with nausea and diarrhea  Symptoms began 4 d.  Started after he ate [employer], noticed half the patty wasn't cooked after eating some.  Patient has abdominal pain, subj fever, and diarrhea Patient denies vomiting, myalgias, and URI symptoms Treatment to date: no Sick contacts: none known  Past Medical History:  Diagnosis Date   Allergy    Asthma    At risk for sleep apnea    STOP-BANG= 5    SENT TO PCP 03-17-2014   GI bleed    Hard of hearing    History of seizure    HX menigitis x2  in middle and high school - residual seizure's on medications--  last seizure age 52   Hypertension    Obesity    Personal history of meningitis    X2  MIDDLE AND HIGH SCHOOL--  residual seizure's on medication -- last seizure age 43   Prostate cancer ([place]) MONITORED BY DR OTTELIN   stage T1c, Gleason 3+4, PSA 12.79, vol 38.5cc    Exam BP 130/80   Pulse (!) 104   Temp 98.5 F (36.9 C) (Oral)   Ht '6\' 2"'$  (1.88 m)   Wt 299 lb (135.6 kg)   SpO2 96%   BMI 38.39 kg/m  General:  well developed, well hydrated, in no apparent distress Skin:  warm, no pallor or diaphoresis, no rashes Throat/Pharynx:  lips and gingiva without lesion; tongue and uvula midline; non-inflamed pharynx; no exudates or postnasal drainage Lungs:  clear to auscultation, breath sounds equal bilaterally, no respiratory distress, no wheezes Cardio:  RRR Abdomen:  abdomen soft, nontender; bowel sounds normal; no masses or organomegaly Psych: Appropriate judgement/insight  Assessment and Plan  Gastroenteritis - Plan: ondansetron (ZOFRAN-ODT) 4 MG disintegrating tablet  Zofran prn. Push fluids. Avoid aggravating foods, discussed advancing diet. Seems things are improving.  F/u if symptoms fail to improve, sooner if worsening. The patient voiced understanding and  agreement to the plan.  [place], DO 11/08/21  2:29 PM

## 2021-12-08 ENCOUNTER — Other Ambulatory Visit: Payer: Self-pay | Admitting: Family Medicine

## 2022-01-16 ENCOUNTER — Other Ambulatory Visit: Payer: Self-pay | Admitting: Family Medicine

## 2022-01-16 DIAGNOSIS — J302 Other seasonal allergic rhinitis: Secondary | ICD-10-CM

## 2022-04-29 ENCOUNTER — Other Ambulatory Visit: Payer: Self-pay | Admitting: Family Medicine

## 2022-04-29 DIAGNOSIS — I10 Essential (primary) hypertension: Secondary | ICD-10-CM

## 2022-05-06 ENCOUNTER — Other Ambulatory Visit: Payer: Self-pay | Admitting: Family Medicine

## 2022-05-06 DIAGNOSIS — J302 Other seasonal allergic rhinitis: Secondary | ICD-10-CM

## 2022-05-09 ENCOUNTER — Other Ambulatory Visit (HOSPITAL_BASED_OUTPATIENT_CLINIC_OR_DEPARTMENT_OTHER): Payer: Self-pay

## 2022-05-09 ENCOUNTER — Encounter: Payer: Self-pay | Admitting: Family Medicine

## 2022-05-09 ENCOUNTER — Ambulatory Visit (INDEPENDENT_AMBULATORY_CARE_PROVIDER_SITE_OTHER): Payer: 59 | Admitting: Family

## 2022-05-09 VITALS — BP 148/84 | HR 106 | Temp 97.6°F | Resp 18 | Wt 313.0 lb

## 2022-05-09 DIAGNOSIS — J4521 Mild intermittent asthma with (acute) exacerbation: Secondary | ICD-10-CM | POA: Diagnosis not present

## 2022-05-09 DIAGNOSIS — I10 Essential (primary) hypertension: Secondary | ICD-10-CM | POA: Diagnosis not present

## 2022-05-09 DIAGNOSIS — F17200 Nicotine dependence, unspecified, uncomplicated: Secondary | ICD-10-CM

## 2022-05-09 MED ORDER — METHYLPREDNISOLONE SODIUM SUCC 125 MG IJ SOLR
125.0000 mg | Freq: Once | INTRAMUSCULAR | Status: AC
  Administered 2022-05-09: 125 mg via INTRAMUSCULAR

## 2022-05-09 MED ORDER — PREDNISONE 10 MG PO TABS
ORAL_TABLET | ORAL | 0 refills | Status: DC
  Filled 2022-05-09: qty 20, 8d supply, fill #0

## 2022-05-09 NOTE — Progress Notes (Signed)
Subjective:   By signing my name below, I, Madelin Rear, attest that this documentation has been prepared under the direction and in the presence of Debbrah Alar, NP. 05/09/2022.   Patient ID: Peter Camacho, male    DOB: 19-Oct-1960, 62 y.o.   MRN: [id number]  Chief Complaint  Patient presents with   Wheezing    Patient complains of wheezing since sunday    HPI Patient is in today for an office visit.  Allergies:  He endorses known allergies to dust. He recently was spring cleaning and after picking up the carpet inhaled a significant amount of dust.  Shortness of breath:  Since his spring cleaning, he complains of wheezing and shortness of breath. However, he does note some improvement in his symptoms since their initial onset. He has an albuterol inhaler on hand.  He does not typically need a nebulizer machine at home.  Blood pressure:  He confirms that his blood pressure has occasionally been as high as it is today (157/97), but this is not frequent. On recheck his BP is 148/84. Of note, he did forget to take his antihypertensives this morning. BP Readings from Last 3 Encounters:  05/09/22 (!) 148/84  11/08/21 130/80  07/01/21 120/81   Partial Deafness:  He states that he is only able to hear with his left ear.  Past Medical History:  Diagnosis Date   Allergy    Asthma    At risk for sleep apnea    STOP-BANG= 5    SENT TO PCP 03-17-2014   GI bleed    Hard of hearing    History of seizure    HX menigitis x2  in middle and high school - residual seizure's on medications--  last seizure age 79   Hypertension    Obesity    Personal history of meningitis    X2  MIDDLE AND HIGH SCHOOL--  residual seizure's on medication -- last seizure age 30   Prostate cancer ([place]) MONITORED BY DR OTTELIN   stage T1c, Gleason 3+4, PSA 12.79, vol 38.5cc    Past Surgical History:  Procedure Laterality Date   ESOPHAGOGASTRODUODENOSCOPY (EGD) WITH PROPOFOL N/A 08/10/2019   Procedure:  ESOPHAGOGASTRODUODENOSCOPY (EGD) WITH PROPOFOL;  Surgeon: Ladene Artist, MD;  Location: [facility];  Service: Endoscopy;  Laterality: N/A;   HEMOSTASIS CONTROL  08/10/2019   Procedure: HEMOSTASIS CONTROL;  Surgeon: Ladene Artist, MD;  Location: [facility];  Service: Endoscopy;;   HOT HEMOSTASIS N/A 08/10/2019   Procedure: HOT HEMOSTASIS (ARGON PLASMA COAGULATION/BICAP);  Surgeon: Ladene Artist, MD;  Location: [facility];  Service: Endoscopy;  Laterality: N/A;   PROSTATE BIOPSY     RADIOACTIVE SEED IMPLANT N/A 03/20/2014   Procedure: RADIOACTIVE SEED IMPLANT;  Surgeon: Claybon Jabs, MD;  [facility];  Service: Urology;  Laterality: N/A;   SCLEROTHERAPY  08/10/2019   Procedure: SCLEROTHERAPY;  Surgeon: Ladene Artist, MD;  Location: [facility];  Service: Endoscopy;;    Family History  Problem Relation Age of Onset   CAD Mother 61   Diabetes Mother    CAD Father 38   Cancer Neg Hx     Social History   Socioeconomic History   Marital status: Single    Spouse name: Not on file   Number of children: Not on file   Years of education: Not on file   Highest education level: Not on file  Occupational History   Occupation: metal heat treating  Tobacco Use  Smoking status: Some Days    Packs/day: 0.30    Years: 40.00    Total pack years: 12.00    Types: Cigarettes   Smokeless tobacco: Never   Tobacco comments:    quit 11/11/16  Vaping Use   Vaping Use: Never used  Substance and Sexual Activity   Alcohol use: No   Drug use: No   Sexual activity: Not Currently    Birth control/protection: None  Other Topics Concern   Not on file  Social History Narrative   Not on file   Social Determinants of Health   Financial Resource Strain: Not on file  Food Insecurity: Not on file  Transportation Needs: Not on file  Physical Activity: Not on file  Stress: Not on file  Social Connections: Not on file  Intimate Partner Violence: Not on file     Outpatient Medications Prior to Visit  Medication Sig Dispense Refill   acetaminophen (TYLENOL) 500 MG tablet Take 500 mg by mouth every 6 (six) hours as needed for mild pain, moderate pain, fever or headache.     albuterol (VENTOLIN HFA) 108 (90 Base) MCG/ACT inhaler INHALE 2 PUFFS INTO THE LUNGS EVERY 6 HOURS AS NEEDED FOR WHEEZING OR SHORTNESS OF BREATH 8.5 each 2   amLODipine (NORVASC) 5 MG tablet TAKE 1 TABLET (5 MG TOTAL) BY MOUTH DAILY. 90 tablet 0   hydrochlorothiazide (HYDRODIURIL) 25 MG tablet TAKE 1 TABLET (25 MG TOTAL) BY MOUTH DAILY. 90 tablet 0   methocarbamol (ROBAXIN-750) 750 MG tablet Take 1 tablet (750 mg total) by mouth every 8 (eight) hours as needed for muscle spasms. 60 tablet 1   Multiple Vitamin (MULTIVITAMIN WITH MINERALS) TABS tablet Take 1 tablet by mouth daily.     ondansetron (ZOFRAN-ODT) 4 MG disintegrating tablet Take 1 tablet (4 mg total) by mouth every 8 (eight) hours as needed for nausea or vomiting. 20 tablet 0   No facility-administered medications prior to visit.    Allergies  Allergen Reactions   Ace Inhibitors Anaphylaxis   Fish-Derived Products Rash    Review of Systems  HENT:  Positive for hearing loss (Right ear).   Respiratory:  Positive for shortness of breath and wheezing.   Endo/Heme/Allergies:  Positive for environmental allergies.       Objective:    Physical Exam Constitutional:      General: He is not in acute distress.    Appearance: Normal appearance. He is not ill-appearing.  HENT:     Head: Normocephalic and atraumatic.     Right Ear: Tympanic membrane, ear canal and external ear normal.     Left Ear: Tympanic membrane, ear canal and external ear normal.  Eyes:     Extraocular Movements: Extraocular movements intact.     Pupils: Pupils are equal, round, and reactive to light.  Cardiovascular:     Rate and Rhythm: Normal rate and regular rhythm.     Heart sounds: Normal heart sounds. No murmur heard.    No gallop.   Pulmonary:     Effort: Pulmonary effort is normal. No respiratory distress.     Breath sounds: Examination of the right-lower field reveals decreased breath sounds. Examination of the left-lower field reveals decreased breath sounds. Decreased breath sounds and wheezing present. No rales.     Comments: Bilateral expiratory wheezing. Decreased breath sounds to bases bilaterally. Skin:    General: Skin is warm and dry.  Neurological:     General: No focal deficit present.  Mental Status: He is alert and oriented to person, place, and time.  Psychiatric:        Mood and Affect: Mood normal.        Behavior: Behavior normal.     BP (!) 148/84   Pulse (!) 106   Temp 97.6 F (36.4 C) (Oral)   Resp 18   Wt (!) 313 lb (142 kg)   SpO2 97%   BMI 40.19 kg/m  Wt Readings from Last 3 Encounters:  05/09/22 (!) 313 lb (142 kg)  11/08/21 299 lb (135.6 kg)  07/01/21 (!) 310 lb 2 oz (140.7 kg)        Assessment & Plan:   Problem List Items Addressed This Visit       Unprioritized   Tobacco dependence (Chronic)    He is an ongoing smoker which is likely a contributing factor to his respiratory issues.  May also represent some underlying COPD. Pt is counseled on the importance of tobacco cessation.       Essential hypertension (Chronic)    BP Readings from Last 3 Encounters:  05/09/22 (!) 148/84  11/08/21 130/80  07/01/21 120/81  BP above goal. States he forgot his BP medication today. Advised pt to return in 2 weeks for nurse visit BP check.  Restart amlodipine and hctz.       Mild intermittent asthma with (acute) exacerbation - Primary    Will rx with solumedrol '125mg'$  IM here in the office along with albuterol neb treatment. To be followed by predisone taper and albuterol mdi every 4-6 hrs at home.   Addendum: pt reports breathing much better following albuterol nebulizer given today in the office.       Relevant Medications   predniSONE (DELTASONE) 10 MG tablet      Meds ordered this encounter  Medications   predniSONE (DELTASONE) 10 MG tablet    Sig: Take 4 tablets by mouth daily for 2 days, then 3 tablets daily for 2 days, then 2 tablets daily for 2 days, and then 1 tablet daily for 2 days.    Dispense:  20 tablet    Refill:  0    Order Specific Question:   Supervising Provider    Answer:   Penni Homans A [[id number]]   methylPREDNISolone sodium succinate (SOLU-MEDROL) 125 mg/2 mL injection 125 mg    I, Nance Pear, NP, personally preformed the services described in this documentation.  All medical record entries made by the scribe were at my direction and in my presence.  I have reviewed the chart and discharge instructions (if applicable) and agree that the record reflects my personal performance and is accurate and complete.  05/09/2022.  I,Mathew Stumpf,acting as a Education administrator for [employer], NP.,have documented all relevant documentation on the behalf of Nance Pear, NP,as directed by  Nance Pear, NP while in the presence of Nance Pear, NP.   Nance Pear, NP

## 2022-05-09 NOTE — Assessment & Plan Note (Signed)
He is an ongoing smoker which is likely a contributing factor to his respiratory issues.  May also represent some underlying COPD. Pt is counseled on the importance of tobacco cessation.

## 2022-05-09 NOTE — Assessment & Plan Note (Signed)
BP Readings from Last 3 Encounters:  05/09/22 (!) 148/84  11/08/21 130/80  07/01/21 120/81   BP above goal. States he forgot his BP medication today. Advised pt to return in 2 weeks for nurse visit BP check.  Restart amlodipine and hctz.

## 2022-05-09 NOTE — Assessment & Plan Note (Addendum)
Will rx with solumedrol '125mg'$  IM here in the office along with albuterol neb treatment. To be followed by predisone taper and albuterol mdi every 4-6 hrs at home.   Addendum: pt reports breathing much better following albuterol nebulizer given today in the office.

## 2022-05-24 ENCOUNTER — Ambulatory Visit: Payer: BLUE CROSS/BLUE SHIELD

## 2022-06-02 ENCOUNTER — Other Ambulatory Visit (HOSPITAL_BASED_OUTPATIENT_CLINIC_OR_DEPARTMENT_OTHER): Payer: Self-pay

## 2022-06-02 ENCOUNTER — Encounter: Payer: Self-pay | Admitting: Family Medicine

## 2022-06-02 ENCOUNTER — Ambulatory Visit (INDEPENDENT_AMBULATORY_CARE_PROVIDER_SITE_OTHER): Payer: Self-pay

## 2022-06-02 ENCOUNTER — Other Ambulatory Visit: Payer: Self-pay

## 2022-06-02 VITALS — BP 130/84 | HR 90

## 2022-06-02 DIAGNOSIS — I1A0 Resistant hypertension: Secondary | ICD-10-CM

## 2022-06-02 MED ORDER — PREDNISONE 10 MG PO TABS
ORAL_TABLET | ORAL | 0 refills | Status: DC
  Filled 2022-06-02: qty 20, 8d supply, fill #0

## 2022-06-02 NOTE — Progress Notes (Signed)
Pt here for Blood pressure check per   Pt currently takes:  Hydrochlorothiazide 25 mg, Norvasc 5 mg take 1 tablet by mouth daily.  BP Readings from Last 3 Encounters:  05/09/22 (!) 148/84  11/08/21 130/80  07/01/21 120/81     Pt reports compliance with medication.  BP today @ = 132/86 Right Arm Left Arm 128/84 HR =  Pt advised per: Continue taking same BP medication follow up at next visit.

## 2022-06-29 ENCOUNTER — Other Ambulatory Visit (HOSPITAL_BASED_OUTPATIENT_CLINIC_OR_DEPARTMENT_OTHER): Payer: Self-pay

## 2022-06-29 ENCOUNTER — Encounter: Payer: Self-pay | Admitting: Family Medicine

## 2022-06-29 ENCOUNTER — Ambulatory Visit (INDEPENDENT_AMBULATORY_CARE_PROVIDER_SITE_OTHER): Payer: 59 | Admitting: Family Medicine

## 2022-06-29 VITALS — BP 126/70 | HR 84 | Ht 74.0 in | Wt 321.4 lb

## 2022-06-29 DIAGNOSIS — J4521 Mild intermittent asthma with (acute) exacerbation: Secondary | ICD-10-CM | POA: Diagnosis not present

## 2022-06-29 DIAGNOSIS — J309 Allergic rhinitis, unspecified: Secondary | ICD-10-CM

## 2022-06-29 MED ORDER — CETIRIZINE HCL 10 MG PO TABS
10.0000 mg | ORAL_TABLET | Freq: Every day | ORAL | 11 refills | Status: AC
  Filled 2022-06-29: qty 100, 100d supply, fill #0

## 2022-06-29 MED ORDER — FLUTICASONE PROPIONATE 50 MCG/ACT NA SUSP
2.0000 | Freq: Every day | NASAL | 6 refills | Status: AC
  Filled 2022-06-29: qty 16, 30d supply, fill #0
  Filled 2022-08-07: qty 16, 30d supply, fill #1

## 2022-06-29 MED ORDER — IPRATROPIUM-ALBUTEROL 0.5-2.5 (3) MG/3ML IN SOLN
3.0000 mL | Freq: Once | RESPIRATORY_TRACT | Status: AC
Start: 2022-06-29 — End: 2022-06-29
  Administered 2022-06-29: 3 mL via RESPIRATORY_TRACT

## 2022-06-29 MED ORDER — MONTELUKAST SODIUM 10 MG PO TABS
10.0000 mg | ORAL_TABLET | Freq: Every day | ORAL | 3 refills | Status: DC
  Filled 2022-06-29: qty 30, 30d supply, fill #0
  Filled 2022-08-07: qty 30, 30d supply, fill #1
  Filled 2022-10-19: qty 30, 30d supply, fill #2

## 2022-06-29 MED ORDER — OLOPATADINE HCL 0.1 % OP SOLN
1.0000 [drp] | Freq: Two times a day (BID) | OPHTHALMIC | 12 refills | Status: AC
  Filled 2022-06-29: qty 5, 25d supply, fill #0
  Filled 2022-08-07: qty 5, 25d supply, fill #1

## 2022-06-29 MED ORDER — METHYLPREDNISOLONE ACETATE 40 MG/ML IJ SUSP
40.0000 mg | Freq: Once | INTRAMUSCULAR | Status: AC
  Administered 2022-06-29: 40 mg via INTRAMUSCULAR

## 2022-06-29 NOTE — Patient Instructions (Addendum)
Breathing treatment today  Start Singulair, Flonase, and Zyrtec daily Adding as needed allergy eye drops  Steroid shot today

## 2022-06-29 NOTE — Progress Notes (Signed)
Acute Office Visit  Subjective:     Patient ID: Peter Camacho, male    DOB: October 24, 1960, 62 y.o.   MRN: [id number]  Chief Complaint  Patient presents with   Allergies    Early mornings and late at night     HPI Patient is in today for allergy concerns.   Allergic Rhinitis: ALYAS CREARY is here for evaluation of possible allergic rhinitis and conjunctivitis. Patient's symptoms include clear rhinorrhea, itchy eyes, itchy nose, nasal congestion, sneezing, watery eyes, and mild dyspnea . These symptoms are seasonal. Current triggers include exposure to pollens, dust, and mold. The patient has been suffering from these symptoms for approximately 2 months. The patient has tried  albuterol (a few times a day for the past week)  with good relief of symptoms.  The patient has a history of asthma.   He has gotten improvement with steroid shot and nebulized treatment in the past.     ROS All review of systems negative except what is listed in the HPI      Objective:    BP 126/70   Pulse 84   Ht 6\' 2"  (1.88 m)   Wt (!) 321 lb 6.4 oz (145.8 kg)   SpO2 94%   BMI 41.27 kg/m    Physical Exam Vitals reviewed.  Constitutional:      General: He is not in acute distress.    Appearance: Normal appearance. He is obese. He is not ill-appearing.  Eyes:     Conjunctiva/sclera: Conjunctivae normal.  Cardiovascular:     Rate and Rhythm: Normal rate and regular rhythm.     Pulses: Normal pulses.     Heart sounds: Normal heart sounds.  Pulmonary:     Effort: Pulmonary effort is normal.     Breath sounds: Wheezing present. No rhonchi or rales.  Skin:    General: Skin is warm and dry.  Neurological:     Mental Status: He is alert and oriented to person, place, and time.  Psychiatric:        Mood and Affect: Mood normal.        Behavior: Behavior normal.        Thought Content: Thought content normal.        Judgment: Judgment normal.     No results found for any visits on  06/29/22.      Assessment & Plan:   Problem List Items Addressed This Visit     Mild intermittent asthma with (acute) exacerbation   Relevant Medications   montelukast (SINGULAIR) 10 MG tablet   Other Visit Diagnoses     Allergic rhinitis, unspecified seasonality, unspecified trigger    -  Primary   Relevant Medications   fluticasone (FLONASE) 50 MCG/ACT nasal spray   cetirizine (ZYRTEC) 10 MG tablet   montelukast (SINGULAIR) 10 MG tablet   olopatadine (PATADAY) 0.1 % ophthalmic solution       Breathing treatment today  Start Singulair, Flonase, and Zyrtec daily Adding as needed allergy eye drops  Steroid shot today  Patient aware of signs/symptoms requiring further/urgent evaluation.     Meds ordered this encounter  Medications   fluticasone (FLONASE) 50 MCG/ACT nasal spray    Sig: Place 2 sprays into both nostrils daily.    Dispense:  16 g    Refill:  6    Order Specific Question:   Supervising Provider    Answer:   Danise Edge A [[id number]]   cetirizine (ZYRTEC) 10 MG tablet  Sig: Take 1 tablet (10 mg total) by mouth daily.    Dispense:  30 tablet    Refill:  11    Order Specific Question:   Supervising Provider    Answer:   Danise Edge A [[id number]]   montelukast (SINGULAIR) 10 MG tablet    Sig: Take 1 tablet (10 mg total) by mouth at bedtime.    Dispense:  30 tablet    Refill:  3    Order Specific Question:   Supervising Provider    Answer:   Danise Edge A [[id number]]   olopatadine (PATADAY) 0.1 % ophthalmic solution    Sig: Place 1 drop into both eyes 2 (two) times daily.    Dispense:  5 mL    Refill:  12    Order Specific Question:   Supervising Provider    Answer:   Danise Edge A [[id number]]   methylPREDNISolone acetate (DEPO-MEDROL) injection 40 mg   ipratropium-albuterol (DUONEB) 0.5-2.5 (3) MG/3ML nebulizer solution 3 mL    Return if symptoms worsen or fail to improve.  Clayborne Dana, NP

## 2022-08-01 ENCOUNTER — Other Ambulatory Visit: Payer: Self-pay | Admitting: Family Medicine

## 2022-08-01 DIAGNOSIS — J302 Other seasonal allergic rhinitis: Secondary | ICD-10-CM

## 2022-08-02 ENCOUNTER — Other Ambulatory Visit: Payer: Self-pay | Admitting: Family Medicine

## 2022-08-07 ENCOUNTER — Other Ambulatory Visit (HOSPITAL_BASED_OUTPATIENT_CLINIC_OR_DEPARTMENT_OTHER): Payer: Self-pay

## 2022-08-08 ENCOUNTER — Other Ambulatory Visit (HOSPITAL_BASED_OUTPATIENT_CLINIC_OR_DEPARTMENT_OTHER): Payer: Self-pay

## 2022-09-10 ENCOUNTER — Emergency Department (HOSPITAL_BASED_OUTPATIENT_CLINIC_OR_DEPARTMENT_OTHER)
Admission: EM | Admit: 2022-09-10 | Discharge: 2022-09-10 | Disposition: A | Discharge status: Home / Self Care | Payer: 59 | Attending: Emergency Medicine | Admitting: Emergency Medicine

## 2022-09-10 ENCOUNTER — Other Ambulatory Visit: Payer: Self-pay

## 2022-09-10 ENCOUNTER — Encounter (HOSPITAL_BASED_OUTPATIENT_CLINIC_OR_DEPARTMENT_OTHER): Payer: Self-pay

## 2022-09-10 ENCOUNTER — Emergency Department (HOSPITAL_BASED_OUTPATIENT_CLINIC_OR_DEPARTMENT_OTHER): Payer: 59

## 2022-09-10 DIAGNOSIS — Z7951 Long term (current) use of inhaled steroids: Secondary | ICD-10-CM | POA: Diagnosis not present

## 2022-09-10 DIAGNOSIS — I771 Stricture of artery: Secondary | ICD-10-CM | POA: Diagnosis not present

## 2022-09-10 DIAGNOSIS — D72829 Elevated white blood cell count, unspecified: Secondary | ICD-10-CM | POA: Insufficient documentation

## 2022-09-10 DIAGNOSIS — J45909 Unspecified asthma, uncomplicated: Secondary | ICD-10-CM | POA: Diagnosis not present

## 2022-09-10 DIAGNOSIS — I10 Essential (primary) hypertension: Secondary | ICD-10-CM | POA: Insufficient documentation

## 2022-09-10 DIAGNOSIS — R0989 Other specified symptoms and signs involving the circulatory and respiratory systems: Secondary | ICD-10-CM | POA: Diagnosis not present

## 2022-09-10 DIAGNOSIS — Z7952 Long term (current) use of systemic steroids: Secondary | ICD-10-CM | POA: Insufficient documentation

## 2022-09-10 DIAGNOSIS — J40 Bronchitis, not specified as acute or chronic: Secondary | ICD-10-CM

## 2022-09-10 DIAGNOSIS — Z79899 Other long term (current) drug therapy: Secondary | ICD-10-CM | POA: Diagnosis not present

## 2022-09-10 DIAGNOSIS — R918 Other nonspecific abnormal finding of lung field: Secondary | ICD-10-CM | POA: Diagnosis not present

## 2022-09-10 DIAGNOSIS — Z20822 Contact with and (suspected) exposure to covid-19: Secondary | ICD-10-CM | POA: Diagnosis not present

## 2022-09-10 DIAGNOSIS — R0602 Shortness of breath: Secondary | ICD-10-CM | POA: Diagnosis present

## 2022-09-10 DIAGNOSIS — I700 Atherosclerosis of aorta: Secondary | ICD-10-CM | POA: Diagnosis not present

## 2022-09-10 LAB — CBC WITH DIFFERENTIAL/PLATELET
Abs Immature Granulocytes: 0.04 10*3/uL (ref 0.00–0.07)
Basophils Absolute: 0.1 10*3/uL (ref 0.0–0.1)
Basophils Relative: 0 %
Eosinophils Absolute: 0.5 10*3/uL (ref 0.0–0.5)
Eosinophils Relative: 4 %
HCT: 44.1 % (ref 39.0–52.0)
Hemoglobin: 14.5 g/dL (ref 13.0–17.0)
Immature Granulocytes: 0 %
Lymphocytes Relative: 13 %
Lymphs Abs: 1.5 10*3/uL (ref 0.7–4.0)
MCH: 29.5 pg (ref 26.0–34.0)
MCHC: 32.9 g/dL (ref 30.0–36.0)
MCV: 89.8 fL (ref 80.0–100.0)
Monocytes Absolute: 0.7 10*3/uL (ref 0.1–1.0)
Monocytes Relative: 6 %
Neutro Abs: 8.6 10*3/uL — ABNORMAL HIGH (ref 1.7–7.7)
Neutrophils Relative %: 77 %
Platelets: 309 10*3/uL (ref 150–400)
RBC: 4.91 MIL/uL (ref 4.22–5.81)
RDW: 14.6 % (ref 11.5–15.5)
WBC: 11.4 10*3/uL — ABNORMAL HIGH (ref 4.0–10.5)
nRBC: 0 % (ref 0.0–0.2)

## 2022-09-10 LAB — BASIC METABOLIC PANEL
Anion gap: 11 (ref 5–15)
BUN: 12 mg/dL (ref 8–23)
CO2: 29 mmol/L (ref 22–32)
Calcium: 9 mg/dL (ref 8.9–10.3)
Chloride: 97 mmol/L — ABNORMAL LOW (ref 98–111)
Creatinine, Ser: 0.81 mg/dL (ref 0.61–1.24)
GFR, Estimated: >60 mL/min (ref >60)
Glucose, Bld: 98 mg/dL (ref 70–99)
Potassium: 3.3 mmol/L — ABNORMAL LOW (ref 3.5–5.1)
Sodium: 137 mmol/L (ref 135–145)

## 2022-09-10 LAB — RESP PANEL BY RT-PCR (RSV, FLU A&B, COVID)  RVPGX2
Influenza A by PCR: NEGATIVE
Influenza B by PCR: NEGATIVE
Resp Syncytial Virus by PCR: NEGATIVE
SARS Coronavirus 2 by RT PCR: NEGATIVE

## 2022-09-10 MED ORDER — ALBUTEROL SULFATE HFA 108 (90 BASE) MCG/ACT IN AERS: 1.0000 | INHALATION_SPRAY | Freq: Four times a day (QID) | RESPIRATORY_TRACT | 0 refills | Status: DC | PRN

## 2022-09-10 MED ORDER — IPRATROPIUM-ALBUTEROL 0.5-2.5 (3) MG/3ML IN SOLN
3.0000 mL | RESPIRATORY_TRACT | Status: AC
  Administered 2022-09-10: 3 mL via RESPIRATORY_TRACT
  Filled 2022-09-10: qty 3

## 2022-09-10 MED ORDER — AMLODIPINE BESYLATE 5 MG PO TABS
5.0000 mg | ORAL_TABLET | Freq: Once | ORAL | Status: AC
  Administered 2022-09-10: 5 mg via ORAL
  Filled 2022-09-10: qty 1

## 2022-09-10 MED ORDER — PREDNISONE 50 MG PO TABS
60.0000 mg | ORAL_TABLET | Freq: Once | ORAL | Status: AC
  Administered 2022-09-10: 60 mg via ORAL
  Filled 2022-09-10: qty 1

## 2022-09-10 MED ORDER — PREDNISONE 20 MG PO TABS: 40.0000 mg | ORAL_TABLET | Freq: Every day | ORAL | 0 refills | Status: DC

## 2022-09-10 MED ORDER — AZITHROMYCIN 250 MG PO TABS: 250.0000 mg | ORAL_TABLET | Freq: Every day | ORAL | 0 refills | Status: DC

## 2022-09-10 MED ORDER — HYDROCHLOROTHIAZIDE 25 MG PO TABS
25.0000 mg | ORAL_TABLET | Freq: Once | ORAL | Status: AC
  Administered 2022-09-10: 25 mg via ORAL
  Filled 2022-09-10: qty 1

## 2022-09-10 NOTE — ED Notes (Signed)
ED Provider at bedside. 

## 2022-09-10 NOTE — ED Triage Notes (Addendum)
Pt ambulatory to triage. Reports problems with allergies this past week due to heat at work Cough clears congestion. Feels SOB. Denies CP. Pt HOH

## 2022-09-10 NOTE — Discharge Instructions (Signed)
It was a pleasure taking part of your care today.  As we discussed, I believe that your shortness of breath was secondary to bronchitis.  Please continue to abstain from smoking.  Please begin taking steroids starting tomorrow for the next 4 days.  You will take 40 mg once a day for the next 4 days beginning on 7/15.  Please also begin taking Z-Pak today.  You will take 500 mg today and then take 250 mg every day until you no longer have medication left.  I have also refilled your albuterol inhaler.  I have also given you a work note.  Please follow-up with your primary care doctor for reevaluation.  Please return to the ED with any new or worsening symptoms.

## 2022-09-10 NOTE — ED Provider Notes (Signed)
[place] [facility] Provider Note   CSN: [id number] Arrival date & time: 09/10/22  1030     History  Chief Complaint  Patient presents with   Shortness of Breath    Peter Camacho is a 62 y.o. male with medical history of asthma, GI bleed, hard hearing, hypertension, obesity.  Patient presents to ED for evaluation of shortness of breath.  Patient reports over the last 3 days he has been progressively short of breath.  He reports that he feels as if he has a "tightness" in his lungs that he needs to cough up.  He states he feels congested.  He reports that he has been using his albuterol inhaler at home which will slightly alleviate his shortness of breath but he feels as if shortness of breath persists after albuterol inhaler wears off.  He denies fevers, new cough, leg swelling, chest pain, nausea, vomiting, abdominal pain.  He also arrives hypertensive with a blood pressure of 183/113 but states he has not taken his blood pressure medication today because he knew that he was coming here.  Patient reports he does not currently smoke, states that he last had cigarettes 1-1/2 weeks ago.   Shortness of Breath Associated symptoms: wheezing   Associated symptoms: no abdominal pain, no chest pain, no cough, no fever and no vomiting        Home Medications Prior to Admission medications   Medication Sig Start Date End Date Taking? Authorizing Provider  albuterol (VENTOLIN HFA) 108 (90 Base) MCG/ACT inhaler Inhale 1-2 puffs into the lungs every 6 (six) hours as needed for wheezing or shortness of breath. 09/10/22  Yes Al Decant, PA-C  azithromycin (ZITHROMAX) 250 MG tablet Take 1 tablet (250 mg total) by mouth daily. Take first 2 tablets together, then 1 every day until finished. 09/10/22  Yes Al Decant, PA-C  predniSONE (DELTASONE) 20 MG tablet Take 2 tablets (40 mg total) by mouth daily. 09/10/22  Yes Al Decant, PA-C   acetaminophen (TYLENOL) 500 MG tablet Take 500 mg by mouth every 6 (six) hours as needed for mild pain, moderate pain, fever or headache.    [provider]  amLODipine (NORVASC) 5 MG tablet TAKE 1 TABLET (5 MG TOTAL) BY MOUTH DAILY. 05/01/22   Sharlene Dory, DO  cetirizine (ZYRTEC) 10 MG tablet Take 1 tablet (10 mg total) by mouth daily. 06/29/22   Clayborne Dana, NP  fluticasone (FLONASE) 50 MCG/ACT nasal spray Place 2 sprays into both nostrils daily. 06/29/22   Clayborne Dana, NP  hydrochlorothiazide (HYDRODIURIL) 25 MG tablet TAKE 1 TABLET (25 MG TOTAL) BY MOUTH DAILY. 08/02/22 10/31/22  Sharlene Dory, DO  methocarbamol (ROBAXIN-750) 750 MG tablet Take 1 tablet (750 mg total) by mouth every 8 (eight) hours as needed for muscle spasms. Patient not taking: Reported on 06/29/2022 07/01/21   Sharlene Dory, DO  montelukast (SINGULAIR) 10 MG tablet Take 1 tablet (10 mg total) by mouth at bedtime. 06/29/22   Clayborne Dana, NP  Multiple Vitamin (MULTIVITAMIN WITH MINERALS) TABS tablet Take 1 tablet by mouth daily.    [provider]  olopatadine (PATADAY) 0.1 % ophthalmic solution Place 1 drop into both eyes 2 (two) times daily. 06/29/22   Clayborne Dana, NP  ondansetron (ZOFRAN-ODT) 4 MG disintegrating tablet Take 1 tablet (4 mg total) by mouth every 8 (eight) hours as needed for nausea or vomiting. Patient not taking: Reported on 06/29/2022  11/08/21   Sharlene Dory, DO      Allergies    Ace inhibitors and Fish-derived products    Review of Systems   Review of Systems  Constitutional:  Negative for fever.  Respiratory:  Positive for shortness of breath and wheezing. Negative for cough.   Cardiovascular:  Negative for chest pain and leg swelling.  Gastrointestinal:  Negative for abdominal pain, nausea and vomiting.  Neurological:  Negative for dizziness, weakness and light-headedness.  All other systems reviewed and are negative.   Physical Exam Updated  Vital Signs BP (!) 152/100   Pulse 91   Temp 98.3 F (36.8 C) (Oral)   Resp 18   Wt (!) 143.8 kg   SpO2 100%   BMI 40.70 kg/m  Physical Exam Vitals and nursing note reviewed.  Constitutional:      General: He is not in acute distress.    Appearance: Normal appearance. He is not ill-appearing, toxic-appearing or diaphoretic.  HENT:     Head: Normocephalic and atraumatic.     Nose: Nose normal.     Mouth/Throat:     Mouth: Mucous membranes are moist.     Pharynx: Oropharynx is clear.  Eyes:     Extraocular Movements: Extraocular movements intact.     Conjunctiva/sclera: Conjunctivae normal.     Pupils: Pupils are equal, round, and reactive to light.  Cardiovascular:     Rate and Rhythm: Normal rate and regular rhythm.  Pulmonary:     Effort: Pulmonary effort is normal.     Breath sounds: Wheezing present.  Abdominal:     General: Abdomen is flat. Bowel sounds are normal.     Palpations: Abdomen is soft.     Tenderness: There is no abdominal tenderness.  Musculoskeletal:     Cervical back: Normal range of motion and neck supple. No tenderness.     Right lower leg: No edema.     Left lower leg: No edema.  Skin:    General: Skin is warm and dry.     Capillary Refill: Capillary refill takes less than 2 seconds.  Neurological:     Mental Status: He is alert and oriented to person, place, and time.     ED Results / Procedures / Treatments   Labs (all labs ordered are listed, but only abnormal results are displayed) Labs Reviewed  BASIC METABOLIC PANEL - Abnormal; Notable for the following components:      Result Value   Potassium 3.3 (*)    Chloride 97 (*)    All other components within normal limits  CBC WITH DIFFERENTIAL/PLATELET - Abnormal; Notable for the following components:   WBC 11.4 (*)    Neutro Abs 8.6 (*)    All other components within normal limits  RESP PANEL BY RT-PCR (RSV, FLU A&B, COVID)  [id number]    EKG None  Radiology DG Chest 2  View  Result Date: 09/10/2022 CLINICAL DATA:  Chest congestion. EXAM: CHEST - 2 VIEW COMPARISON:  October 24, 2020 FINDINGS: Tortuosity and calcific atherosclerotic disease of the aorta. Normal cardiac silhouette. Subtle peribronchial thickening with lower lobe predominance may be seen with bronchitic changes or early atypical pneumonia. No pneumothorax or pleural effusion. IMPRESSION: Subtle peribronchial thickening with lower lobe predominance may be seen with bronchitic changes or early atypical pneumonia. Electronically Signed   By: Ted Mcalpine M.D.   On: 09/10/2022 12:19    Procedures Procedures   Medications Ordered in ED Medications  amLODipine (NORVASC) tablet 5 mg (  5 mg Oral Given 09/10/22 1537)  hydrochlorothiazide (HYDRODIURIL) tablet 25 mg (25 mg Oral Given 09/10/22 1537)  ipratropium-albuterol (DUONEB) 0.5-2.5 (3) MG/3ML nebulizer solution 3 mL (3 mLs Nebulization Given 09/10/22 1543)  predniSONE (DELTASONE) tablet 60 mg (60 mg Oral Given 09/10/22 1537)    ED Course/ Medical Decision Making/ A&P   {  Medical Decision Making Amount and/or Complexity of Data Reviewed Labs: ordered. Radiology: ordered.  Risk Prescription drug management.   62 year old male presents to ED for evaluation of shortness of breath.  Please see HPI for further details.  On examination the patient is afebrile, nontachycardic.  His lung sounds have some wheezing in the left lung fields, and he is not hypoxic on room air.  Abdomen is soft and compressible throughout.  Neurological examination at baseline.  No pitting edema to bilateral lower extremities.  Patient overall nontoxic in appearance speaking in full sentences.  Patient CBC shows leukocytosis 11.4, no anemia.  Metabolic panel shows potassium 3.3, no other electrolyte derangement, no elevated creatinine, anion gap 11.  Viral panel negative for all.  Chest x-ray shows subtle peribronchial thickening of the lower lung predominance which may  be seen in bronchitis changes or early atypical pneumonia.  Patient reports 2 episodes of pneumonia in this past and he reports that he does not feel similar to past instances of pneumonia.  He reports that he feels more so as if this is due to his asthma.  At this time, on reevaluation after receiving duo nebulizer patient reports feeling much better.  The patient was sent home with steroids, azithromycin and advised to follow-up with his PCP.  He will also be written out of work tomorrow and be given an albuterol inhaler.  Return precautions were given and the patient voiced understanding.  He had all of his questions answered to his satisfaction.  He is stable to discharge home.   Final Clinical Impression(s) / ED Diagnoses Final diagnoses:  Bronchitis    Rx / DC Orders ED Discharge Orders          Ordered    albuterol (VENTOLIN HFA) 108 (90 Base) MCG/ACT inhaler  Every 6 hours PRN        09/10/22 1635    predniSONE (DELTASONE) 20 MG tablet  Daily        09/10/22 1635    azithromycin (ZITHROMAX) 250 MG tablet  Daily        09/10/22 1635              Clent Ridges 09/10/22 1636    Maia Plan, MD 09/13/22 1129

## 2022-09-10 NOTE — ED Notes (Signed)
   09/10/22 1614  Resting  Supplemental oxygen during test? No  Resting Heart Rate 98  Resting Sp02 95  Lap 1 (250 feet)  HR 106  02 Sat 93   Ambulated without increased WOB, SpO2 92% or greater.

## 2022-09-10 NOTE — ED Notes (Signed)

## 2022-09-13 ENCOUNTER — Encounter (HOSPITAL_BASED_OUTPATIENT_CLINIC_OR_DEPARTMENT_OTHER): Payer: Self-pay

## 2022-09-13 ENCOUNTER — Encounter: Payer: Self-pay | Admitting: Family Medicine

## 2022-09-13 ENCOUNTER — Other Ambulatory Visit (HOSPITAL_BASED_OUTPATIENT_CLINIC_OR_DEPARTMENT_OTHER): Payer: Self-pay

## 2022-09-13 ENCOUNTER — Ambulatory Visit: Payer: 59 | Admitting: Family Medicine

## 2022-09-13 VITALS — BP 136/82 | HR 84 | Temp 98.7°F | Resp 20 | Ht 74.0 in | Wt 325.0 lb

## 2022-09-13 DIAGNOSIS — Z125 Encounter for screening for malignant neoplasm of prostate: Secondary | ICD-10-CM

## 2022-09-13 DIAGNOSIS — L280 Lichen simplex chronicus: Secondary | ICD-10-CM

## 2022-09-13 DIAGNOSIS — Z0000 Encounter for general adult medical examination without abnormal findings: Secondary | ICD-10-CM

## 2022-09-13 DIAGNOSIS — R7303 Prediabetes: Secondary | ICD-10-CM

## 2022-09-13 LAB — COMPREHENSIVE METABOLIC PANEL
ALT: 15 U/L (ref 0–53)
AST: 13 U/L (ref 0–37)
Albumin: 3.9 g/dL (ref 3.5–5.2)
Alkaline Phosphatase: 113 U/L (ref 39–117)
BUN: 20 mg/dL (ref 6–23)
CO2: 31 mEq/L (ref 19–32)
Calcium: 9.4 mg/dL (ref 8.4–10.5)
Chloride: 100 mEq/L (ref 96–112)
Creatinine, Ser: 0.99 mg/dL (ref 0.40–1.50)
GFR: 81.39 mL/min (ref >60.00)
Glucose, Bld: 109 mg/dL — ABNORMAL HIGH (ref 70–99)
Potassium: 4 mEq/L (ref 3.5–5.1)
Sodium: 140 mEq/L (ref 135–145)
Total Bilirubin: 0.3 mg/dL (ref 0.2–1.2)
Total Protein: 7.4 g/dL (ref 6.0–8.3)

## 2022-09-13 LAB — LIPID PANEL
Cholesterol: 220 mg/dL — ABNORMAL HIGH (ref 0–200)
HDL: 97.6 mg/dL (ref >39.00)
LDL Cholesterol: 111 mg/dL — ABNORMAL HIGH (ref 0–99)
NonHDL: 122.24
Total CHOL/HDL Ratio: 2
Triglycerides: 56 mg/dL (ref 0.0–149.0)
VLDL: 11.2 mg/dL (ref 0.0–40.0)

## 2022-09-13 LAB — HEMOGLOBIN A1C: Hgb A1c MFr Bld: 6.4 % (ref 4.6–6.5)

## 2022-09-13 LAB — PSA: PSA: 0.07 ng/mL — ABNORMAL LOW (ref 0.10–4.00)

## 2022-09-13 MED ORDER — TRIAMCINOLONE ACETONIDE 0.5 % EX CREA
1.0000 | TOPICAL_CREAM | Freq: Two times a day (BID) | CUTANEOUS | 0 refills | Status: DC
  Filled 2022-09-13: qty 60, 30d supply, fill #0

## 2022-09-13 NOTE — Progress Notes (Signed)
Chief Complaint  Patient presents with   Follow-up    ED follow from 09/10/2022    Well Male Peter Camacho is here for a complete physical.   His last physical was >1 year ago.  Current diet: in general, a "healthy" diet.  Current exercise: walking, active at work Weight trend: stable Fatigue out of ordinary? No. Seat belt? Yes.   Advanced directive? Yes  Health maintenance Shingrix- No Colonoscopy- Yes Tetanus- Yes HIV- Yes Hep C- Yes   Past Medical History:  Diagnosis Date   Allergy    Asthma    At risk for sleep apnea    STOP-BANG= 5    SENT TO PCP 03-17-2014   GI bleed    Hard of hearing    History of seizure    HX menigitis x2  in middle and high school - residual seizure's on medications--  last seizure age 59   Hypertension    Obesity    Personal history of meningitis    X2  MIDDLE AND HIGH SCHOOL--  residual seizure's on medication -- last seizure age 74   Prostate cancer (HCC) MONITORED BY DR OTTELIN   stage T1c, Gleason 3+4, PSA 12.79, vol 38.5cc      Past Surgical History:  Procedure Laterality Date   ESOPHAGOGASTRODUODENOSCOPY (EGD) WITH PROPOFOL N/A 08/10/2019   Procedure: ESOPHAGOGASTRODUODENOSCOPY (EGD) WITH PROPOFOL;  Surgeon: Meryl Dare, MD;  Location: [facility];  Service: Endoscopy;  Laterality: N/A;   HEMOSTASIS CONTROL  08/10/2019   Procedure: HEMOSTASIS CONTROL;  Surgeon: Meryl Dare, MD;  Location: [facility];  Service: Endoscopy;;   HOT HEMOSTASIS N/A 08/10/2019   Procedure: HOT HEMOSTASIS (ARGON PLASMA COAGULATION/BICAP);  Surgeon: Meryl Dare, MD;  Location: [facility];  Service: Endoscopy;  Laterality: N/A;   PROSTATE BIOPSY     RADIOACTIVE SEED IMPLANT N/A 03/20/2014   Procedure: RADIOACTIVE SEED IMPLANT;  Surgeon: Garnett Farm, MD;  Location: [facility];  Service: Urology;  Laterality: N/A;   SCLEROTHERAPY  08/10/2019   Procedure: SCLEROTHERAPY;  Surgeon: Meryl Dare, MD;  Location: [facility];  Service: Endoscopy;;    Medications  Current Outpatient Medications on File Prior to Visit  Medication Sig Dispense Refill   acetaminophen (TYLENOL) 500 MG tablet Take 500 mg by mouth every 6 (six) hours as needed for mild pain, moderate pain, fever or headache.     albuterol (VENTOLIN HFA) 108 (90 Base) MCG/ACT inhaler Inhale 1-2 puffs into the lungs every 6 (six) hours as needed for wheezing or shortness of breath. 18 g 0   amLODipine (NORVASC) 5 MG tablet TAKE 1 TABLET (5 MG TOTAL) BY MOUTH DAILY. 90 tablet 0   azithromycin (ZITHROMAX) 250 MG tablet Take 1 tablet (250 mg total) by mouth daily. Take first 2 tablets together, then 1 every day until finished. 6 tablet 0   cetirizine (ZYRTEC) 10 MG tablet Take 1 tablet (10 mg total) by mouth daily. 30 tablet 11   fluticasone (FLONASE) 50 MCG/ACT nasal spray Place 2 sprays into both nostrils daily. 16 g 6   hydrochlorothiazide (HYDRODIURIL) 25 MG tablet TAKE 1 TABLET (25 MG TOTAL) BY MOUTH DAILY. 90 tablet 0   montelukast (SINGULAIR) 10 MG tablet Take 1 tablet (10 mg total) by mouth at bedtime. 30 tablet 3   Multiple Vitamin (MULTIVITAMIN WITH MINERALS) TABS tablet Take 1 tablet by mouth daily.     olopatadine (PATADAY) 0.1 % ophthalmic solution Place 1 drop into both eyes 2 (  two) times daily. 5 mL 12   predniSONE (DELTASONE) 20 MG tablet Take 2 tablets (40 mg total) by mouth daily. 8 tablet 0    Allergies Allergies  Allergen Reactions   Ace Inhibitors Anaphylaxis   Fish-Derived Products Rash    Family History Family History  Problem Relation Age of Onset   CAD Mother 76   Diabetes Mother    CAD Father 29   Cancer Neg Hx     Review of Systems: Constitutional:  no fevers Eye:  no recent significant change in vision Ear/Nose/Mouth/Throat:  Ears:  no hearing loss Nose/Mouth/Throat:  no complaints of nasal congestion, no sore throat Cardiovascular:  no chest pain Respiratory:  no shortness of breath Gastrointestinal:   no change in bowel habits GU:  Male: negative for dysuria, frequency Musculoskeletal/Extremities:  no joint pain Integumentary (Skin/Breast):  no abnormal skin lesions reported Neurologic:  no headaches Endocrine: No unexpected weight changes Hematologic/Lymphatic:  no abnormal bleeding  Exam BP 136/82 (BP Location: Left Arm, Patient Position: Sitting, Cuff Size: Large)   Pulse 84   Temp 98.7 F (37.1 C) (Oral)   Resp 20   Ht 6\' 2"  (1.88 m)   Wt (!) 325 lb (147.4 kg)   SpO2 96%   BMI 41.73 kg/m  General:  well developed, well nourished, in no apparent distress Skin:  no significant moles, warts, or growths Head:  no masses, lesions, or tenderness Eyes:  pupils equal and round, sclera anicteric without injection Ears:  canals without lesions, TMs shiny without retraction, no obvious effusion, no erythema Nose:  nares patent, mucosa normal Throat/Pharynx:  lips and gingiva without lesion; tongue and uvula midline; non-inflamed pharynx; no exudates or postnasal drainage Neck: neck supple without adenopathy, thyromegaly, or masses Cardiac: RRR, no bruits, no LE edema Lungs:  clear to auscultation, breath sounds equal bilaterally, no respiratory distress Abdomen: BS+, soft, non-tender, non-distended, no masses or organomegaly noted Rectal: Deferred Musculoskeletal:  symmetrical muscle groups noted without atrophy or deformity Neuro:  gait normal; deep tendon reflexes normal and symmetric Psych: well oriented with normal range of affect and appropriate judgment/insight  Assessment and Plan  Well adult exam - Plan: Comprehensive metabolic panel, Lipid panel  Prediabetes - Plan: Hemoglobin A1c  Screening for prostate cancer - Plan: PSA  LSC (lichen simplex chronicus) - Plan: triamcinolone cream (KENALOG) 0.5 %   Well 62 y.o. male. Counseled on diet and exercise. Counseled on risks and benefits of prostate cancer screening with PSA. The patient agrees to undergo  testing. Advanced directive form requested today.  Shingrix rec'd.  Immunizations, labs, and further orders as above. Follow up in 6 mo. The patient voiced understanding and agreement to the plan.  Jilda Roche Friona, DO 09/13/22 11:47 AM

## 2022-09-13 NOTE — Patient Instructions (Addendum)
Give us 2-3 business days to get the results of your labs back.   Keep the diet clean and stay active.  Please get me a copy of your advanced directive form at your convenience.   The Shingrix vaccine (for shingles) is a 2 shot series spaced 2-6 months apart. It can make people feel low energy, achy and almost like they have the flu for 48 hours after injection. 1/5 people can have nausea and/or vomiting. Please plan accordingly when deciding on when to get this shot. Call our office for a nurse visit appointment to get this. The second shot of the series is less severe regarding the side effects, but it still lasts 48 hours.   Let us know if you need anything.  

## 2022-09-15 ENCOUNTER — Other Ambulatory Visit (HOSPITAL_BASED_OUTPATIENT_CLINIC_OR_DEPARTMENT_OTHER): Payer: Self-pay

## 2022-09-15 ENCOUNTER — Telehealth: Payer: Self-pay

## 2022-09-15 MED ORDER — METFORMIN HCL ER 500 MG PO TB24: 500.0000 mg | ORAL_TABLET | Freq: Every day | ORAL | 5 refills | Status: DC

## 2022-09-15 NOTE — Telephone Encounter (Signed)
Pt states that he would like a call back to discuss his options for being borderline diabetic. Pt states that whatever Dr. Carmelia Roller suggest he will do.

## 2022-09-15 NOTE — Telephone Encounter (Signed)
I will send in some metformin.

## 2022-09-15 NOTE — Addendum Note (Signed)
Addended by: Radene Gunning on: 09/15/2022 04:48 PM   Modules accepted: Orders

## 2022-09-15 NOTE — Telephone Encounter (Signed)
Patient informed. 

## 2022-09-18 ENCOUNTER — Other Ambulatory Visit (HOSPITAL_BASED_OUTPATIENT_CLINIC_OR_DEPARTMENT_OTHER): Payer: Self-pay

## 2022-09-18 MED ORDER — METFORMIN HCL ER 500 MG PO TB24
500.0000 mg | ORAL_TABLET | Freq: Every day | ORAL | 5 refills | Status: DC
  Filled 2022-09-18 – 2022-09-19 (×3): qty 30, 30d supply, fill #0
  Filled 2022-11-21: qty 30, 30d supply, fill #1

## 2022-09-18 NOTE — Telephone Encounter (Signed)
Pt called stating he would like to have his metformin rerouted to the following pharmacy:  [facility] [place] - [facility] [address], [address] [place] [address] Phone: [phone]  Fax: [phone]

## 2022-09-18 NOTE — Telephone Encounter (Signed)
Sent and patient aware

## 2022-09-18 NOTE — Addendum Note (Signed)
Addended by: Scharlene Gloss B on: 09/18/2022 10:43 AM   Modules accepted: Orders

## 2022-09-19 ENCOUNTER — Other Ambulatory Visit: Payer: Self-pay

## 2022-09-19 ENCOUNTER — Other Ambulatory Visit (HOSPITAL_BASED_OUTPATIENT_CLINIC_OR_DEPARTMENT_OTHER): Payer: Self-pay

## 2022-09-29 ENCOUNTER — Telehealth: Payer: Self-pay | Admitting: Family Medicine

## 2022-09-29 MED ORDER — ALBUTEROL SULFATE HFA 108 (90 BASE) MCG/ACT IN AERS: 1.0000 | INHALATION_SPRAY | Freq: Four times a day (QID) | RESPIRATORY_TRACT | 0 refills | Status: DC | PRN

## 2022-09-29 NOTE — Telephone Encounter (Signed)
Sent in

## 2022-09-29 NOTE — Telephone Encounter (Signed)
Prescription Request  09/29/2022  Is this a "Controlled Substance" medicine? No  LOV: 09/13/2022  What is the name of the medication or equipment? albuterol (VENTOLIN HFA) 108 (90 Base) MCG/ACT inhaler   Have you contacted your pharmacy to request a refill? yes   Which pharmacy would you like this sent to?  [id number] - [place] [place] - [address] [place] [address] Phone: [phone] Fax: [phone]   Patient notified that their request is being sent to the clinical staff for review and that they should receive a response within 2 business days.   Please advise at [facility] [phone]

## 2022-09-29 NOTE — Addendum Note (Signed)
Addended by: Scharlene Gloss B on: 09/29/2022 11:34 AM   Modules accepted: Orders

## 2022-10-19 ENCOUNTER — Other Ambulatory Visit: Payer: Self-pay

## 2022-10-19 ENCOUNTER — Other Ambulatory Visit (HOSPITAL_BASED_OUTPATIENT_CLINIC_OR_DEPARTMENT_OTHER): Payer: Self-pay

## 2022-10-20 ENCOUNTER — Other Ambulatory Visit: Payer: Self-pay | Admitting: Family Medicine

## 2022-10-20 ENCOUNTER — Other Ambulatory Visit (HOSPITAL_BASED_OUTPATIENT_CLINIC_OR_DEPARTMENT_OTHER): Payer: Self-pay

## 2022-10-20 DIAGNOSIS — J302 Other seasonal allergic rhinitis: Secondary | ICD-10-CM

## 2022-10-20 MED ORDER — ALBUTEROL SULFATE HFA 108 (90 BASE) MCG/ACT IN AERS
2.0000 | INHALATION_SPRAY | Freq: Four times a day (QID) | RESPIRATORY_TRACT | 5 refills | Status: AC | PRN
Start: 2022-10-20 — End: ?
  Filled 2022-10-20: qty 6.7, 25d supply, fill #0
  Filled 2022-11-21: qty 6.7, 25d supply, fill #1
  Filled 2022-12-22: qty 6.7, 25d supply, fill #2

## 2022-10-23 ENCOUNTER — Other Ambulatory Visit (HOSPITAL_BASED_OUTPATIENT_CLINIC_OR_DEPARTMENT_OTHER): Payer: Self-pay

## 2022-11-21 ENCOUNTER — Other Ambulatory Visit (HOSPITAL_BASED_OUTPATIENT_CLINIC_OR_DEPARTMENT_OTHER): Payer: Self-pay

## 2022-11-22 ENCOUNTER — Other Ambulatory Visit: Payer: Self-pay

## 2022-11-22 ENCOUNTER — Other Ambulatory Visit (HOSPITAL_BASED_OUTPATIENT_CLINIC_OR_DEPARTMENT_OTHER): Payer: Self-pay

## 2022-11-22 ENCOUNTER — Other Ambulatory Visit (HOSPITAL_COMMUNITY): Payer: Self-pay

## 2022-12-22 ENCOUNTER — Other Ambulatory Visit (HOSPITAL_BASED_OUTPATIENT_CLINIC_OR_DEPARTMENT_OTHER): Payer: Self-pay

## 2022-12-24 ENCOUNTER — Encounter (HOSPITAL_BASED_OUTPATIENT_CLINIC_OR_DEPARTMENT_OTHER): Payer: Self-pay

## 2022-12-24 ENCOUNTER — Inpatient Hospital Stay (HOSPITAL_BASED_OUTPATIENT_CLINIC_OR_DEPARTMENT_OTHER)
Admission: EM | Admit: 2022-12-24 | Discharge: 2022-12-30 | DRG: 436 | Disposition: A | Discharge status: Home / Self Care | Payer: 59 | Attending: Student | Admitting: Student

## 2022-12-24 ENCOUNTER — Other Ambulatory Visit: Payer: Self-pay

## 2022-12-24 ENCOUNTER — Emergency Department (HOSPITAL_BASED_OUTPATIENT_CLINIC_OR_DEPARTMENT_OTHER): Payer: 59

## 2022-12-24 DIAGNOSIS — E860 Dehydration: Secondary | ICD-10-CM | POA: Diagnosis present

## 2022-12-24 DIAGNOSIS — I10 Essential (primary) hypertension: Secondary | ICD-10-CM | POA: Diagnosis present

## 2022-12-24 DIAGNOSIS — C229 Malignant neoplasm of liver, not specified as primary or secondary: Secondary | ICD-10-CM | POA: Diagnosis not present

## 2022-12-24 DIAGNOSIS — J9811 Atelectasis: Secondary | ICD-10-CM | POA: Diagnosis present

## 2022-12-24 DIAGNOSIS — N390 Urinary tract infection, site not specified: Secondary | ICD-10-CM | POA: Diagnosis present

## 2022-12-24 DIAGNOSIS — D630 Anemia in neoplastic disease: Secondary | ICD-10-CM | POA: Diagnosis present

## 2022-12-24 DIAGNOSIS — K7689 Other specified diseases of liver: Secondary | ICD-10-CM

## 2022-12-24 DIAGNOSIS — J439 Emphysema, unspecified: Secondary | ICD-10-CM | POA: Diagnosis present

## 2022-12-24 DIAGNOSIS — Z833 Family history of diabetes mellitus: Secondary | ICD-10-CM

## 2022-12-24 DIAGNOSIS — Z515 Encounter for palliative care: Secondary | ICD-10-CM

## 2022-12-24 DIAGNOSIS — E669 Obesity, unspecified: Secondary | ICD-10-CM | POA: Insufficient documentation

## 2022-12-24 DIAGNOSIS — J4521 Mild intermittent asthma with (acute) exacerbation: Secondary | ICD-10-CM

## 2022-12-24 DIAGNOSIS — C787 Secondary malignant neoplasm of liver and intrahepatic bile duct: Secondary | ICD-10-CM | POA: Diagnosis not present

## 2022-12-24 DIAGNOSIS — H9190 Unspecified hearing loss, unspecified ear: Secondary | ICD-10-CM | POA: Diagnosis present

## 2022-12-24 DIAGNOSIS — R109 Unspecified abdominal pain: Secondary | ICD-10-CM | POA: Diagnosis not present

## 2022-12-24 DIAGNOSIS — Z23 Encounter for immunization: Secondary | ICD-10-CM

## 2022-12-24 DIAGNOSIS — J309 Allergic rhinitis, unspecified: Secondary | ICD-10-CM

## 2022-12-24 DIAGNOSIS — Z888 Allergy status to other drugs, medicaments and biological substances status: Secondary | ICD-10-CM

## 2022-12-24 DIAGNOSIS — K5730 Diverticulosis of large intestine without perforation or abscess without bleeding: Secondary | ICD-10-CM | POA: Diagnosis not present

## 2022-12-24 DIAGNOSIS — Z8249 Family history of ischemic heart disease and other diseases of the circulatory system: Secondary | ICD-10-CM

## 2022-12-24 DIAGNOSIS — Z7984 Long term (current) use of oral hypoglycemic drugs: Secondary | ICD-10-CM

## 2022-12-24 DIAGNOSIS — I700 Atherosclerosis of aorta: Secondary | ICD-10-CM | POA: Diagnosis not present

## 2022-12-24 DIAGNOSIS — R112 Nausea with vomiting, unspecified: Secondary | ICD-10-CM | POA: Diagnosis present

## 2022-12-24 DIAGNOSIS — F17210 Nicotine dependence, cigarettes, uncomplicated: Secondary | ICD-10-CM | POA: Diagnosis present

## 2022-12-24 DIAGNOSIS — R000 Tachycardia, unspecified: Secondary | ICD-10-CM | POA: Diagnosis not present

## 2022-12-24 DIAGNOSIS — Z91013 Allergy to seafood: Secondary | ICD-10-CM

## 2022-12-24 DIAGNOSIS — Z6841 Body Mass Index (BMI) 40.0 and over, adult: Secondary | ICD-10-CM

## 2022-12-24 DIAGNOSIS — N200 Calculus of kidney: Secondary | ICD-10-CM | POA: Diagnosis not present

## 2022-12-24 DIAGNOSIS — R160 Hepatomegaly, not elsewhere classified: Secondary | ICD-10-CM | POA: Insufficient documentation

## 2022-12-24 DIAGNOSIS — E872 Acidosis, unspecified: Secondary | ICD-10-CM | POA: Diagnosis present

## 2022-12-24 DIAGNOSIS — Z8661 Personal history of infections of the central nervous system: Secondary | ICD-10-CM

## 2022-12-24 DIAGNOSIS — Z8546 Personal history of malignant neoplasm of prostate: Secondary | ICD-10-CM

## 2022-12-24 DIAGNOSIS — Z79899 Other long term (current) drug therapy: Secondary | ICD-10-CM

## 2022-12-24 LAB — COMPREHENSIVE METABOLIC PANEL
ALT: 164 U/L — ABNORMAL HIGH (ref 0–44)
AST: 119 U/L — ABNORMAL HIGH (ref 15–41)
Albumin: 3.2 g/dL — ABNORMAL LOW (ref 3.5–5.0)
Alkaline Phosphatase: 307 U/L — ABNORMAL HIGH (ref 38–126)
Anion gap: 13 (ref 5–15)
BUN: 11 mg/dL (ref 8–23)
CO2: 23 mmol/L (ref 22–32)
Calcium: 8.6 mg/dL — ABNORMAL LOW (ref 8.9–10.3)
Chloride: 100 mmol/L (ref 98–111)
Creatinine, Ser: 0.91 mg/dL (ref 0.61–1.24)
GFR, Estimated: >60 mL/min (ref >60)
Glucose, Bld: 109 mg/dL — ABNORMAL HIGH (ref 70–99)
Potassium: 3.9 mmol/L (ref 3.5–5.1)
Sodium: 136 mmol/L (ref 135–145)
Total Bilirubin: 1.2 mg/dL (ref 0.3–1.2)
Total Protein: 7.9 g/dL (ref 6.5–8.1)

## 2022-12-24 LAB — CBC
HCT: 40.3 % (ref 39.0–52.0)
Hemoglobin: 13.4 g/dL (ref 13.0–17.0)
MCH: 29.5 pg (ref 26.0–34.0)
MCHC: 33.3 g/dL (ref 30.0–36.0)
MCV: 88.8 fL (ref 80.0–100.0)
Platelets: 268 10*3/uL (ref 150–400)
RBC: 4.54 MIL/uL (ref 4.22–5.81)
RDW: 15.1 % (ref 11.5–15.5)
WBC: 13.4 10*3/uL — ABNORMAL HIGH (ref 4.0–10.5)
nRBC: 0 % (ref 0.0–0.2)

## 2022-12-24 LAB — LIPASE, BLOOD: Lipase: 23 U/L (ref 11–51)

## 2022-12-24 MED ORDER — ONDANSETRON HCL 4 MG/2ML IJ SOLN
4.0000 mg | Freq: Once | INTRAMUSCULAR | Status: AC
  Administered 2022-12-24: 4 mg via INTRAVENOUS
  Filled 2022-12-24: qty 2

## 2022-12-24 MED ORDER — IOHEXOL 300 MG/ML  SOLN
100.0000 mL | Freq: Once | INTRAMUSCULAR | Status: AC | PRN
  Administered 2022-12-24: 100 mL via INTRAVENOUS

## 2022-12-24 MED ORDER — HYDROMORPHONE HCL 1 MG/ML IJ SOLN
1.0000 mg | Freq: Once | INTRAMUSCULAR | Status: AC
  Administered 2022-12-24: 1 mg via INTRAVENOUS
  Filled 2022-12-24: qty 1

## 2022-12-24 MED ORDER — MORPHINE SULFATE (PF) 4 MG/ML IV SOLN
4.0000 mg | Freq: Once | INTRAVENOUS | Status: AC
  Administered 2022-12-24: 4 mg via INTRAVENOUS
  Filled 2022-12-24: qty 1

## 2022-12-24 MED ORDER — LACTATED RINGERS IV BOLUS
1000.0000 mL | Freq: Once | INTRAVENOUS | Status: AC
  Administered 2022-12-24: 1000 mL via INTRAVENOUS

## 2022-12-24 NOTE — ED Provider Notes (Signed)
Care assumed at shift change. Here with R sided abdominal pain, worsening for the last 2 weeks, elevated LFTs, pending CT.  Physical Exam  BP 129/88   Pulse (!) 122   Temp 98.6 F (37 C) (Oral)   Resp 20   Ht 6\' 2"  (1.88 m)   Wt (!) 142.4 kg   SpO2 98%   BMI 40.32 kg/m   Physical Exam  Procedures  Procedures  ED Course / MDM   Clinical Course as of 12/25/22 0102  Wynelle Link Dec 24, 2022  2344 Patient reports continued pain, will give additional pain medication while awaiting CT results.  [CS]  Mon Dec 25, 2022  0007 I personally viewed the images from radiology studies and agree with radiologist interpretation: CT shows numerous liver mets from unknown primary. Has history of prostate cancer, but will need additional workup to determine primary. Given his abnormal labs and difficult to control pain, will admit to Hospitalist. Patient aware of CT findings.  [CS]  5621 Spoke with Dr. Julian Reil, Hospitalist, who will accept for admission.  [CS]    Clinical Course User Index [CS] Pollyann Savoy, MD   Medical Decision Making Problems Addressed: Cancer, metastatic to liver [facility]): undiagnosed new problem with uncertain prognosis  Amount and/or Complexity of Data Reviewed Labs: ordered. Decision-making details documented in ED Course. Radiology: ordered and independent interpretation performed. Decision-making details documented in ED Course.  Risk Prescription drug management. Parenteral controlled substances. Decision regarding hospitalization.          Pollyann Savoy, MD 12/25/22 [phone]

## 2022-12-24 NOTE — ED Triage Notes (Signed)
Pt states that his stomach has been "irritated" for two weeks and today started having R sided abd pain.

## 2022-12-24 NOTE — ED Provider Notes (Signed)
EMERGENCY DEPARTMENT AT [facility] [place] Provider Note   CSN: [id number] Arrival date & time: 12/24/22  2105     History {Add pertinent medical, surgical, social history, OB history to HPI:1} Chief Complaint  Patient presents with   Abdominal Pain    Peter Camacho is a 62 y.o. male.   Abdominal Pain 62 year old male history of hypertension,, asthma presenting for abdominal pain.  Patient states for about 2 weeks he has had some irritation in his stomach worse on the right side.  Over last few days has had severe pain to the right hemiabdomen more in the right upper quadrant.  Some nausea, no vomiting.  Some occasional diarrhea which is nonbloody, no melena.  No chest pain, maybe some shortness of breath.  No fevers or chills.  No known sick contacts.  He has not had pain like this before.  He thinks he still has his gallbladder and his appendix.    Home Medications Prior to Admission medications   Medication Sig Start Date End Date Taking? Authorizing Provider  acetaminophen (TYLENOL) 500 MG tablet Take 500 mg by mouth every 6 (six) hours as needed for mild pain, moderate pain, fever or headache.    [provider]  albuterol (VENTOLIN HFA) 108 (90 Base) MCG/ACT inhaler Inhale 2 puffs into the lungs every 6 (six) hours as needed for wheezing or shortness of breath. 10/20/22   Wendling, Jilda Roche, DO  amLODipine (NORVASC) 5 MG tablet TAKE 1 TABLET (5 MG TOTAL) BY MOUTH DAILY. 05/01/22   Sharlene Dory, DO  azithromycin (ZITHROMAX) 250 MG tablet Take 1 tablet (250 mg total) by mouth daily. Take first 2 tablets together, then 1 every day until finished. 09/10/22   Al Decant, PA-C  cetirizine (ZYRTEC) 10 MG tablet Take 1 tablet (10 mg total) by mouth daily. 06/29/22   Clayborne Dana, NP  fluticasone (FLONASE) 50 MCG/ACT nasal spray Place 2 sprays into both nostrils daily. 06/29/22   Clayborne Dana, NP  hydrochlorothiazide (HYDRODIURIL) 25 MG  tablet TAKE 1 TABLET (25 MG TOTAL) BY MOUTH DAILY. 08/02/22 10/31/22  Sharlene Dory, DO  metFORMIN (GLUCOPHAGE-XR) 500 MG 24 hr tablet Take 1 tablet (500 mg total) by mouth daily with breakfast. 09/18/22   Carmelia Roller, Jilda Roche, DO  montelukast (SINGULAIR) 10 MG tablet Take 1 tablet (10 mg total) by mouth at bedtime. 06/29/22   Clayborne Dana, NP  Multiple Vitamin (MULTIVITAMIN WITH MINERALS) TABS tablet Take 1 tablet by mouth daily.    [provider]  olopatadine (PATADAY) 0.1 % ophthalmic solution Place 1 drop into both eyes 2 (two) times daily. 06/29/22   Clayborne Dana, NP  predniSONE (DELTASONE) 20 MG tablet Take 2 tablets (40 mg total) by mouth daily. 09/10/22   Al Decant, PA-C  triamcinolone cream (KENALOG) 0.5 % Apply 1 application on to the skin 2 (two) times daily. 09/13/22   Sharlene Dory, DO      Allergies    Ace inhibitors and Fish-derived products    Review of Systems   Review of Systems  Gastrointestinal:  Positive for abdominal pain.  Review of systems completed and notable as per HPI.  ROS otherwise negative.   Physical Exam Updated Vital Signs BP 129/88   Pulse (!) 122   Temp 98.6 F (37 C) (Oral)   Resp 20   Ht 6\' 2"  (1.88 m)   Wt (!) 142.4 kg   SpO2 98%   BMI  40.32 kg/m  Physical Exam Vitals and nursing note reviewed.  Constitutional:      General: He is in acute distress.     Appearance: He is well-developed.  HENT:     Head: Normocephalic and atraumatic.     Mouth/Throat:     Mouth: Mucous membranes are moist.     Pharynx: Oropharynx is clear.  Eyes:     Extraocular Movements: Extraocular movements intact.     Conjunctiva/sclera: Conjunctivae normal.     Pupils: Pupils are equal, round, and reactive to light.  Cardiovascular:     Rate and Rhythm: Regular rhythm. Tachycardia present.     Pulses: Normal pulses.     Heart sounds: Normal heart sounds. No murmur heard. Pulmonary:     Effort: Pulmonary effort is normal.  No respiratory distress.     Breath sounds: Normal breath sounds.  Abdominal:     Palpations: Abdomen is soft.     Tenderness: There is abdominal tenderness in the right upper quadrant, right lower quadrant and periumbilical area.  Musculoskeletal:        General: No swelling.     Cervical back: Neck supple.     Right lower leg: No edema.     Left lower leg: No edema.  Skin:    General: Skin is warm and dry.     Capillary Refill: Capillary refill takes less than 2 seconds.  Neurological:     General: No focal deficit present.     Mental Status: He is alert and oriented to person, place, and time.  Psychiatric:        Mood and Affect: Mood normal.     ED Results / Procedures / Treatments   Labs (all labs ordered are listed, but only abnormal results are displayed) Labs Reviewed  LIPASE, BLOOD  COMPREHENSIVE METABOLIC PANEL  CBC  URINALYSIS, ROUTINE W REFLEX MICROSCOPIC    EKG None  Radiology No results found.  Procedures Procedures  {Document cardiac monitor, telemetry assessment procedure when appropriate:1}  Medications Ordered in ED Medications - No data to display  ED Course/ Medical Decision Making/ A&P   {   Click here for ABCD2, HEART and other calculatorsREFRESH Note before signing :1}                              Medical Decision Making Amount and/or Complexity of Data Reviewed Labs: ordered. Radiology: ordered.  Risk Prescription drug management.   Medical Decision Making:   Peter Camacho is a 62 y.o. male who presented to the ED today with abdominal pain, nausea.  Medicine is notable for initial tachycardia, blood pressure stable.  Exam is somewhat uncomfortable, tender in the right hemiabdomen.  Differential including appendicitis, cholecystitis, obstruction, pancreatitis.  No GU symptoms, lower suspicion for torsion, stone.  Notes some possible shortness of breath, no chest pain.  EKG without signs of acute ischemia low suspicion for ACS,  dissection, PE.   {crccomplexity:27900} Reviewed and confirmed nursing documentation for past medical history, family history, social history.  Reassessment and Plan:   ***    Patient's presentation is most consistent with {EM COPA:27473}     {Document critical care time when appropriate:1} {Document review of labs and clinical decision tools ie heart score, Chads2Vasc2 etc:1}  {Document your independent review of radiology images, and any outside records:1} {Document your discussion with family members, caretakers, and with consultants:1} {Document social determinants of health affecting pt's care:1} {Document  your decision making why or why not admission, treatments were needed:1} Final Clinical Impression(s) / ED Diagnoses Final diagnoses:  None    Rx / DC Orders ED Discharge Orders     None

## 2022-12-25 ENCOUNTER — Telehealth: Payer: Self-pay

## 2022-12-25 ENCOUNTER — Observation Stay (HOSPITAL_COMMUNITY): Payer: 59

## 2022-12-25 DIAGNOSIS — C787 Secondary malignant neoplasm of liver and intrahepatic bile duct: Secondary | ICD-10-CM | POA: Diagnosis present

## 2022-12-25 DIAGNOSIS — Z8546 Personal history of malignant neoplasm of prostate: Secondary | ICD-10-CM | POA: Diagnosis not present

## 2022-12-25 DIAGNOSIS — Z833 Family history of diabetes mellitus: Secondary | ICD-10-CM | POA: Diagnosis not present

## 2022-12-25 DIAGNOSIS — C61 Malignant neoplasm of prostate: Secondary | ICD-10-CM | POA: Diagnosis not present

## 2022-12-25 DIAGNOSIS — Z515 Encounter for palliative care: Secondary | ICD-10-CM | POA: Diagnosis not present

## 2022-12-25 DIAGNOSIS — Z23 Encounter for immunization: Secondary | ICD-10-CM | POA: Diagnosis not present

## 2022-12-25 DIAGNOSIS — I10 Essential (primary) hypertension: Secondary | ICD-10-CM | POA: Diagnosis not present

## 2022-12-25 DIAGNOSIS — Z79899 Other long term (current) drug therapy: Secondary | ICD-10-CM | POA: Diagnosis not present

## 2022-12-25 DIAGNOSIS — J9811 Atelectasis: Secondary | ICD-10-CM | POA: Diagnosis not present

## 2022-12-25 DIAGNOSIS — Z6841 Body Mass Index (BMI) 40.0 and over, adult: Secondary | ICD-10-CM | POA: Diagnosis not present

## 2022-12-25 DIAGNOSIS — E860 Dehydration: Secondary | ICD-10-CM | POA: Diagnosis not present

## 2022-12-25 DIAGNOSIS — D630 Anemia in neoplastic disease: Secondary | ICD-10-CM | POA: Diagnosis not present

## 2022-12-25 DIAGNOSIS — N390 Urinary tract infection, site not specified: Secondary | ICD-10-CM | POA: Diagnosis not present

## 2022-12-25 DIAGNOSIS — Z91013 Allergy to seafood: Secondary | ICD-10-CM | POA: Diagnosis not present

## 2022-12-25 DIAGNOSIS — R160 Hepatomegaly, not elsewhere classified: Secondary | ICD-10-CM | POA: Diagnosis not present

## 2022-12-25 DIAGNOSIS — Z8661 Personal history of infections of the central nervous system: Secondary | ICD-10-CM | POA: Diagnosis not present

## 2022-12-25 DIAGNOSIS — J439 Emphysema, unspecified: Secondary | ICD-10-CM | POA: Diagnosis not present

## 2022-12-25 DIAGNOSIS — C220 Liver cell carcinoma: Secondary | ICD-10-CM | POA: Diagnosis not present

## 2022-12-25 DIAGNOSIS — F17210 Nicotine dependence, cigarettes, uncomplicated: Secondary | ICD-10-CM | POA: Diagnosis not present

## 2022-12-25 DIAGNOSIS — Z7984 Long term (current) use of oral hypoglycemic drugs: Secondary | ICD-10-CM | POA: Diagnosis not present

## 2022-12-25 DIAGNOSIS — H9190 Unspecified hearing loss, unspecified ear: Secondary | ICD-10-CM | POA: Diagnosis not present

## 2022-12-25 DIAGNOSIS — Z888 Allergy status to other drugs, medicaments and biological substances status: Secondary | ICD-10-CM | POA: Diagnosis not present

## 2022-12-25 DIAGNOSIS — E872 Acidosis, unspecified: Secondary | ICD-10-CM | POA: Diagnosis not present

## 2022-12-25 DIAGNOSIS — Z8249 Family history of ischemic heart disease and other diseases of the circulatory system: Secondary | ICD-10-CM | POA: Diagnosis not present

## 2022-12-25 LAB — LACTIC ACID, PLASMA
Lactic Acid, Venous: 1.9 mmol/L (ref 0.5–1.9)
Lactic Acid, Venous: 2.1 mmol/L (ref 0.5–1.9)

## 2022-12-25 LAB — HEPATIC FUNCTION PANEL
ALT: 142 U/L — ABNORMAL HIGH (ref 0–44)
AST: 90 U/L — ABNORMAL HIGH (ref 15–41)
Albumin: 2.9 g/dL — ABNORMAL LOW (ref 3.5–5.0)
Alkaline Phosphatase: 280 U/L — ABNORMAL HIGH (ref 38–126)
Bilirubin, Direct: 0.5 mg/dL — ABNORMAL HIGH (ref 0.0–0.2)
Indirect Bilirubin: 0.7 mg/dL (ref 0.3–0.9)
Total Bilirubin: 1.2 mg/dL (ref 0.3–1.2)
Total Protein: 7.3 g/dL (ref 6.5–8.1)

## 2022-12-25 LAB — BASIC METABOLIC PANEL
Anion gap: 10 (ref 5–15)
BUN: 14 mg/dL (ref 8–23)
CO2: 24 mmol/L (ref 22–32)
Calcium: 8.5 mg/dL — ABNORMAL LOW (ref 8.9–10.3)
Chloride: 102 mmol/L (ref 98–111)
Creatinine, Ser: 0.97 mg/dL (ref 0.61–1.24)
GFR, Estimated: >60 mL/min (ref >60)
Glucose, Bld: 113 mg/dL — ABNORMAL HIGH (ref 70–99)
Potassium: 3.8 mmol/L (ref 3.5–5.1)
Sodium: 136 mmol/L (ref 135–145)

## 2022-12-25 LAB — CBC
HCT: 38.8 % — ABNORMAL LOW (ref 39.0–52.0)
Hemoglobin: 12.4 g/dL — ABNORMAL LOW (ref 13.0–17.0)
MCH: 29.6 pg (ref 26.0–34.0)
MCHC: 32 g/dL (ref 30.0–36.0)
MCV: 92.6 fL (ref 80.0–100.0)
Platelets: 242 10*3/uL (ref 150–400)
RBC: 4.19 MIL/uL — ABNORMAL LOW (ref 4.22–5.81)
RDW: 15.4 % (ref 11.5–15.5)
WBC: 13.1 10*3/uL — ABNORMAL HIGH (ref 4.0–10.5)
nRBC: 0 % (ref 0.0–0.2)

## 2022-12-25 LAB — HIV ANTIBODY (ROUTINE TESTING W REFLEX): HIV Screen 4th Generation wRfx: NONREACTIVE

## 2022-12-25 LAB — URINALYSIS, ROUTINE W REFLEX MICROSCOPIC
Glucose, UA: NEGATIVE mg/dL
Ketones, ur: NEGATIVE mg/dL
Leukocytes,Ua: NEGATIVE
Nitrite: NEGATIVE
Protein, ur: 100 mg/dL — AB
Specific Gravity, Urine: 1.01 (ref 1.005–1.030)
pH: 6 (ref 5.0–8.0)

## 2022-12-25 LAB — PHOSPHORUS: Phosphorus: 3.9 mg/dL (ref 2.5–4.6)

## 2022-12-25 LAB — PSA: Prostatic Specific Antigen: 0.06 ng/mL (ref 0.00–4.00)

## 2022-12-25 LAB — URINALYSIS, MICROSCOPIC (REFLEX)

## 2022-12-25 LAB — PROTIME-INR
INR: 1.3 — ABNORMAL HIGH (ref 0.8–1.2)
Prothrombin Time: 16.4 s — ABNORMAL HIGH (ref 11.4–15.2)

## 2022-12-25 LAB — GLUCOSE, CAPILLARY
Glucose-Capillary: 96 mg/dL (ref 70–99)
Glucose-Capillary: 104 mg/dL — ABNORMAL HIGH (ref 70–99)
Glucose-Capillary: 98 mg/dL (ref 70–99)

## 2022-12-25 LAB — MAGNESIUM: Magnesium: 1.9 mg/dL (ref 1.7–2.4)

## 2022-12-25 MED ORDER — GELATIN ABSORBABLE 12-7 MM EX MISC
CUTANEOUS | Status: AC
  Filled 2022-12-25: qty 1

## 2022-12-25 MED ORDER — SODIUM CHLORIDE 0.9 % IV SOLN: INTRAVENOUS | Status: AC

## 2022-12-25 MED ORDER — ONDANSETRON HCL 4 MG/2ML IJ SOLN
4.0000 mg | Freq: Four times a day (QID) | INTRAMUSCULAR | Status: DC | PRN
  Administered 2022-12-26 – 2022-12-27 (×3): 4 mg via INTRAVENOUS
  Filled 2022-12-25 (×3): qty 2

## 2022-12-25 MED ORDER — INSULIN ASPART 100 UNIT/ML IJ SOLN: 0.0000 [IU] | Freq: Every day | INTRAMUSCULAR | Status: DC

## 2022-12-25 MED ORDER — INSULIN ASPART 100 UNIT/ML IJ SOLN: 0.0000 [IU] | Freq: Three times a day (TID) | INTRAMUSCULAR | Status: DC

## 2022-12-25 MED ORDER — MIDAZOLAM HCL 2 MG/2ML IJ SOLN
INTRAMUSCULAR | Status: AC
  Filled 2022-12-25: qty 2

## 2022-12-25 MED ORDER — OXYCODONE HCL 5 MG PO TABS
10.0000 mg | ORAL_TABLET | ORAL | Status: DC | PRN
  Administered 2022-12-25 – 2022-12-26 (×3): 10 mg via ORAL
  Filled 2022-12-25 (×3): qty 2

## 2022-12-25 MED ORDER — MONTELUKAST SODIUM 10 MG PO TABS
10.0000 mg | ORAL_TABLET | Freq: Every day | ORAL | Status: DC
  Administered 2022-12-25 – 2022-12-29 (×5): 10 mg via ORAL
  Filled 2022-12-25 (×5): qty 1

## 2022-12-25 MED ORDER — OXYCODONE HCL 5 MG PO TABS
10.0000 mg | ORAL_TABLET | ORAL | Status: DC | PRN
  Administered 2022-12-25: 10 mg via ORAL
  Filled 2022-12-25: qty 2

## 2022-12-25 MED ORDER — FENTANYL CITRATE (PF) 100 MCG/2ML IJ SOLN
INTRAMUSCULAR | Status: AC
  Filled 2022-12-25: qty 2

## 2022-12-25 MED ORDER — MIDAZOLAM HCL 2 MG/2ML IJ SOLN
INTRAMUSCULAR | Status: AC | PRN
  Administered 2022-12-25: 1 mg via INTRAVENOUS

## 2022-12-25 MED ORDER — LACTATED RINGERS IV BOLUS
1000.0000 mL | Freq: Once | INTRAVENOUS | Status: AC
  Administered 2022-12-25: 1000 mL via INTRAVENOUS

## 2022-12-25 MED ORDER — ALBUTEROL SULFATE (2.5 MG/3ML) 0.083% IN NEBU
2.5000 mg | INHALATION_SOLUTION | Freq: Once | RESPIRATORY_TRACT | Status: AC
  Administered 2022-12-25: 2.5 mg via RESPIRATORY_TRACT
  Filled 2022-12-25: qty 3

## 2022-12-25 MED ORDER — AMLODIPINE BESYLATE 5 MG PO TABS
5.0000 mg | ORAL_TABLET | Freq: Every day | ORAL | Status: DC
  Administered 2022-12-25: 5 mg via ORAL
  Filled 2022-12-25: qty 1

## 2022-12-25 MED ORDER — PIPERACILLIN-TAZOBACTAM 3.375 G IVPB
3.3750 g | Freq: Three times a day (TID) | INTRAVENOUS | Status: DC
  Administered 2022-12-25 – 2022-12-26 (×5): 3.375 g via INTRAVENOUS
  Filled 2022-12-25 (×5): qty 50

## 2022-12-25 MED ORDER — ONDANSETRON HCL 4 MG PO TABS: 4.0000 mg | ORAL_TABLET | Freq: Four times a day (QID) | ORAL | Status: DC | PRN

## 2022-12-25 MED ORDER — ACETAMINOPHEN 325 MG PO TABS
650.0000 mg | ORAL_TABLET | Freq: Four times a day (QID) | ORAL | Status: DC | PRN
Start: 2022-12-25 — End: 2022-12-30

## 2022-12-25 MED ORDER — HYDROMORPHONE HCL 1 MG/ML IJ SOLN
0.5000 mg | INTRAMUSCULAR | Status: DC | PRN
  Administered 2022-12-25: 0.5 mg via INTRAVENOUS
  Filled 2022-12-25: qty 0.5

## 2022-12-25 MED ORDER — ONDANSETRON HCL 4 MG/2ML IJ SOLN
INTRAMUSCULAR | Status: AC
  Filled 2022-12-25: qty 2

## 2022-12-25 MED ORDER — IPRATROPIUM-ALBUTEROL 0.5-2.5 (3) MG/3ML IN SOLN
3.0000 mL | Freq: Once | RESPIRATORY_TRACT | Status: AC
  Administered 2022-12-25: 3 mL via RESPIRATORY_TRACT
  Filled 2022-12-25: qty 3

## 2022-12-25 MED ORDER — LIDOCAINE HCL 1 % IJ SOLN
INTRAMUSCULAR | Status: AC
  Filled 2022-12-25: qty 20

## 2022-12-25 MED ORDER — TRAZODONE HCL 50 MG PO TABS
25.0000 mg | ORAL_TABLET | Freq: Every evening | ORAL | Status: DC | PRN
  Administered 2022-12-25 – 2022-12-29 (×3): 25 mg via ORAL
  Filled 2022-12-25 (×3): qty 1

## 2022-12-25 MED ORDER — OXYCODONE HCL 5 MG PO TABS
5.0000 mg | ORAL_TABLET | ORAL | Status: DC | PRN
  Administered 2022-12-25: 5 mg via ORAL
  Filled 2022-12-25: qty 1

## 2022-12-25 MED ORDER — FENTANYL CITRATE (PF) 100 MCG/2ML IJ SOLN
INTRAMUSCULAR | Status: AC | PRN
  Administered 2022-12-25: 50 ug via INTRAVENOUS

## 2022-12-25 MED ORDER — ACETAMINOPHEN 650 MG RE SUPP: 650.0000 mg | Freq: Four times a day (QID) | RECTAL | Status: DC | PRN

## 2022-12-25 MED ORDER — ONDANSETRON HCL 4 MG/2ML IJ SOLN
INTRAMUSCULAR | Status: AC | PRN
Start: 2022-12-25 — End: 2022-12-25
  Administered 2022-12-25: 4 mg via INTRAVENOUS

## 2022-12-25 MED ORDER — INFLUENZA VIRUS VACC SPLIT PF (FLUZONE) 0.5 ML IM SUSY
0.5000 mL | PREFILLED_SYRINGE | INTRAMUSCULAR | Status: AC
  Administered 2022-12-26: 0.5 mL via INTRAMUSCULAR
  Filled 2022-12-25: qty 0.5

## 2022-12-25 MED ORDER — ALBUTEROL SULFATE (2.5 MG/3ML) 0.083% IN NEBU
2.5000 mg | INHALATION_SOLUTION | RESPIRATORY_TRACT | Status: DC | PRN
  Administered 2022-12-25 – 2022-12-28 (×2): 2.5 mg via RESPIRATORY_TRACT
  Filled 2022-12-25 (×2): qty 3

## 2022-12-25 NOTE — H&P (Signed)
History and Physical  Peter Camacho JYN:[id number] DOB: May 19, 1960 DOA: 12/24/2022  PCP: Sharlene Dory, DO   Chief Complaint: Abdominal pain  HPI: Peter Camacho is a 62 y.o. male with medical history significant for hypertension, obesity, prostate cancer treated with been admitted to the hospital with abdominal pain found to have large liver mass.  Patient states he has been having a nagging abdominal discomfort in the center of his abdomen for the last week, couple days ago.  To the right side of the abdomen.  There is very minimal associated nausea, no fevers, no cough, no changes in bowel or bladder habits.  He has lost a little bit of weight in the last few weeks, but he has been trying to lose weight.  On evaluation in the emergency department, found to have leukocytosis tachycardia.  Urinalysis and chest x-ray unremarkable.  CT scan as noted below with concern for liver mass.  He was given empiric IV Zosyn due to concern for possible sepsis.  Review of Systems: Please see HPI for pertinent positives and negatives. A complete 10 system review of systems are otherwise negative.  Past Medical History:  Diagnosis Date   Allergy    Asthma    At risk for sleep apnea    STOP-BANG= 5    SENT TO PCP 03-17-2014   GI bleed    Hard of hearing    History of seizure    HX menigitis x2  in middle and high school - residual seizure's on medications--  last seizure age 64   Hypertension    Obesity    Personal history of meningitis    X2  MIDDLE AND HIGH SCHOOL--  residual seizure's on medication -- last seizure age 53   Prostate cancer (HCC) MONITORED BY DR OTTELIN   stage T1c, Gleason 3+4, PSA 12.79, vol 38.5cc   Past Surgical History:  Procedure Laterality Date   ESOPHAGOGASTRODUODENOSCOPY (EGD) WITH PROPOFOL N/A 08/10/2019   Procedure: ESOPHAGOGASTRODUODENOSCOPY (EGD) WITH PROPOFOL;  Surgeon: Meryl Dare, MD;  Location: [facility];  Service: Endoscopy;  Laterality: N/A;    HEMOSTASIS CONTROL  08/10/2019   Procedure: HEMOSTASIS CONTROL;  Surgeon: Meryl Dare, MD;  Location: [facility];  Service: Endoscopy;;   HOT HEMOSTASIS N/A 08/10/2019   Procedure: HOT HEMOSTASIS (ARGON PLASMA COAGULATION/BICAP);  Surgeon: Meryl Dare, MD;  Location: [facility];  Service: Endoscopy;  Laterality: N/A;   PROSTATE BIOPSY     RADIOACTIVE SEED IMPLANT N/A 03/20/2014   Procedure: RADIOACTIVE SEED IMPLANT;  Surgeon: Garnett Farm, MD;  Location: [facility];  Service: Urology;  Laterality: N/A;   SCLEROTHERAPY  08/10/2019   Procedure: SCLEROTHERAPY;  Surgeon: Meryl Dare, MD;  Location: [facility];  Service: Endoscopy;;    Social History:  reports that he has been smoking cigarettes. He has a 12 pack-year smoking history. He has never used smokeless tobacco. He reports that he does not drink alcohol and does not use drugs.   Allergies  Allergen Reactions   Ace Inhibitors Anaphylaxis   Eicosapentaenoic Acid Rash   Fish-Derived Products Rash    Family History  Problem Relation Age of Onset   CAD Mother 78   Diabetes Mother    CAD Father 28   Cancer Neg Hx      Prior to Admission medications   Medication Sig Start Date End Date Taking? Authorizing Provider  acetaminophen (TYLENOL) 500 MG tablet Take 500 mg by mouth every 6 (six) hours as  needed for mild pain, moderate pain, fever or headache.    [provider]  albuterol (VENTOLIN HFA) 108 (90 Base) MCG/ACT inhaler Inhale 2 puffs into the lungs every 6 (six) hours as needed for wheezing or shortness of breath. 10/20/22   Wendling, Jilda Roche, DO  amLODipine (NORVASC) 5 MG tablet TAKE 1 TABLET (5 MG TOTAL) BY MOUTH DAILY. 05/01/22   Sharlene Dory, DO  azithromycin (ZITHROMAX) 250 MG tablet Take 1 tablet (250 mg total) by mouth daily. Take first 2 tablets together, then 1 every day until finished. Patient not taking: Reported on 12/25/2022 09/10/22   Al Decant,  PA-C  cetirizine (ZYRTEC) 10 MG tablet Take 1 tablet (10 mg total) by mouth daily. 06/29/22   Clayborne Dana, NP  fluticasone (FLONASE) 50 MCG/ACT nasal spray Place 2 sprays into both nostrils daily. 06/29/22   Clayborne Dana, NP  hydrochlorothiazide (HYDRODIURIL) 25 MG tablet TAKE 1 TABLET (25 MG TOTAL) BY MOUTH DAILY. 08/02/22 10/31/22  Sharlene Dory, DO  metFORMIN (GLUCOPHAGE-XR) 500 MG 24 hr tablet Take 1 tablet (500 mg total) by mouth daily with breakfast. 09/18/22   Carmelia Roller, Jilda Roche, DO  montelukast (SINGULAIR) 10 MG tablet Take 1 tablet (10 mg total) by mouth at bedtime. 06/29/22   Clayborne Dana, NP  Multiple Vitamin (MULTIVITAMIN WITH MINERALS) TABS tablet Take 1 tablet by mouth daily.    [provider]  olopatadine (PATADAY) 0.1 % ophthalmic solution Place 1 drop into both eyes 2 (two) times daily. 06/29/22   Clayborne Dana, NP  predniSONE (DELTASONE) 20 MG tablet Take 2 tablets (40 mg total) by mouth daily. 09/10/22   Al Decant, PA-C  triamcinolone cream (KENALOG) 0.5 % Apply 1 application on to the skin 2 (two) times daily. 09/13/22   Sharlene Dory, DO    Physical Exam: BP 121/79 (BP Location: Left Arm)   Pulse (!) 114   Temp 98.2 F (36.8 C) (Oral)   Resp 18   Ht 6\' 2"  (1.88 m)   Wt (!) 142.4 kg   SpO2 99%   BMI 40.32 kg/m   General:  Alert, oriented, calm, in no acute distress, very hard of hearing Eyes: EOMI, clear conjuctivae, white sclerea Neck: supple, no masses, trachea mildline  Cardiovascular: RRR, no murmurs or rubs, no peripheral edema  Respiratory: clear to auscultation bilaterally, no wheezes, no crackles  Abdomen: soft, tender in the upper bilateral quadrants, nondistended but obese, normal bowel tones heard  Skin: dry, no rashes  Musculoskeletal: no joint effusions, normal range of motion  Psychiatric: appropriate affect, normal speech  Neurologic: extraocular muscles intact, clear speech, moving all extremities with intact  sensorium         Labs on Admission:  Basic Metabolic Panel: Recent Labs  Lab 12/24/22 2155 12/25/22 0407  NA 136 136  K 3.9 3.8  CL 100 102  CO2 23 24  GLUCOSE 109* 113*  BUN 11 14  CREATININE 0.91 0.97  CALCIUM 8.6* 8.5*  MG  --  1.9  PHOS  --  3.9   Liver Function Tests: Recent Labs  Lab 12/24/22 2155 12/25/22 0407  AST 119* 90*  ALT 164* 142*  ALKPHOS 307* 280*  BILITOT 1.2 1.2  PROT 7.9 7.3  ALBUMIN 3.2* 2.9*   Recent Labs  Lab 12/24/22 2155  LIPASE 23   No results for input(s): "AMMONIA" in the last 168 hours. CBC: Recent Labs  Lab 12/24/22 2155 12/25/22 0407  WBC  13.4* 13.1*  HGB 13.4 12.4*  HCT 40.3 38.8*  MCV 88.8 92.6  PLT 268 242   Cardiac Enzymes: No results for input(s): "CKTOTAL", "CKMB", "CKMBINDEX", "TROPONINI" in the last 168 hours.  BNP (last 3 results) No results for input(s): "BNP" in the last 8760 hours.  ProBNP (last 3 results) No results for input(s): "PROBNP" in the last 8760 hours.  CBG: No results for input(s): "GLUCAP" in the last 168 hours.  Radiological Exams on Admission: DG Chest 2 View  Result Date: 12/24/2022 CLINICAL DATA:  Severe right-sided abdominal pain and tachycardia EXAM: CHEST - 2 VIEW COMPARISON:  09/10/2022 FINDINGS: Stable cardiomediastinal silhouette. Aortic atherosclerotic calcification. Left basilar atelectasis. Otherwise no focal consolidation, pleural effusion, or pneumothorax. No displaced rib fractures. IMPRESSION: No acute cardiopulmonary disease. Electronically Signed   By: Minerva Fester M.D.   On: 12/24/2022 23:50   CT ABDOMEN PELVIS W CONTRAST  Result Date: 12/24/2022 CLINICAL DATA:  Right-sided abdominal pain EXAM: CT ABDOMEN AND PELVIS WITH CONTRAST TECHNIQUE: Multidetector CT imaging of the abdomen and pelvis was performed using the standard protocol following bolus administration of intravenous contrast. RADIATION DOSE REDUCTION: This exam was performed according to the departmental  dose-optimization program which includes automated exposure control, adjustment of the mA and/or kV according to patient size and/or use of iterative reconstruction technique. CONTRAST:  OMNIPAQUE IOHEXOL 300 MG/ML  SOLN COMPARISON:  11/21/2013 CT pelvis FINDINGS: Lower chest: No acute abnormality. Hepatobiliary: Numerous hypoattenuating lesions throughout the liver measuring up to 8.0 cm in the right hepatic lobe. Unremarkable gallbladder and biliary tree. Pancreas: Unremarkable. Spleen: Unremarkable. Adrenals/Urinary Tract: Normal adrenal glands. Punctate nonobstructing bilateral nephrolithiasis. No obstructing urinary calculi or hydronephrosis. Unremarkable bladder. Stomach/Bowel: Normal caliber large and small bowel. Colonic diverticulosis greatest about the transverse colon. Possible mild wall thickening versus underdistention about the transverse colon. Normal appendix. Stomach is within normal limits. Vascular/Lymphatic: No acute vascular abnormality. 1.9 cm precaval lymph node (series 301/image 42). Reproductive: Metallic seeds in the prostate. Other: No free intraperitoneal fluid or air. Musculoskeletal: No acute fracture or destructive osseous lesion. IMPRESSION: 1. Numerous hypoattenuating lesions throughout the liver measuring up to 8.0 cm in the right hepatic lobe, concerning for metastases of unknown primary. 2. 1.9 cm precaval lymph node, suspicious for metastatic disease. 3. Punctate nonobstructing bilateral nephrolithiasis. 4. Colonic diverticulosis greatest about the transverse colon where there is mild wall thickening. This could be due to mild diverticulitis or underdistention. Electronically Signed   By: Minerva Fester M.D.   On: 12/24/2022 23:49    Assessment/Plan Peter Camacho is a 62 y.o. male with medical history significant for hypertension, obesity, prostate cancer treated with been admitted to the hospital with abdominal pain found to have large liver mass.   Metastatic  disease of unknown primary-concerning due to numerous hypoattenuating lesions in the liver, measuring up to 8 cm.  He does have a history of prostate cancer. -Observation admission -Check PSA -IR consult requested for possible liver mass biopsy -he will need outpatient oncology follow-up to discuss biopsy results -P.o. oxycodone as needed for pain  Leukocytosis, tachycardia, lactic acidosis-in the setting of suspected active malignancy, I feel that these findings are reactive to his potential malignancy, with lactic acidosis a result of mild liver dysfunction.  Given lack of fever or other specific signs or symptoms of bacterial infection, I have a low suspicion for this.  Will hold off on further antibiotics, and monitor closely for the next 24 hours.,  And monitor closely for the  next 24 hours for objective signs or symptoms of acute infection.  Hypertension-amlodipine  DVT prophylaxis: SCDs while anticipating IR biopsy    Code Status: Full Code  Consults called: None  Admission status: Observation  Time spent: 49 minutes  Felicie Kocher Sharlette Dense MD [facility] Pager [phone]  If 7PM-7AM, please contact night-coverage [email or web address] Password [facility]  12/25/2022, 9:26 AM

## 2022-12-25 NOTE — Telephone Encounter (Signed)
Pt admitted

## 2022-12-25 NOTE — Telephone Encounter (Signed)
Initial Comment Pt is requesting to schedule an appointment for tomorrow. He has intermittent stomach pain and minor headache. He rates his stomach pain as 5/10. He feels like something is eating the inside of his stomach up. Translation No Nurse Assessment Nurse: Nicholaus Bloom, RN, Judeth Cornfield Date/Time Lamount Cohen Time): 12/24/2022 3:15:06 PM Confirm and document reason for call. If symptomatic, describe symptoms. ---caller reports that patient is having abdominal pain since last week intermittent. having diarrhea, mid abdomen. Does the patient have any new or worsening symptoms? ---Yes Will a triage be completed? ---Yes Related visit to physician within the last 2 weeks? ---No Does the PT have any chronic conditions? (i.e. diabetes, asthma, this includes High risk factors for pregnancy, etc.) ---Yes List chronic conditions. ---hearing impaired hearing, hypertension, bleeding ulcer Is this a behavioral health or substance abuse call? ---No Guidelines Guideline Title Affirmed Question Affirmed Notes Nurse Date/Time (Eastern Time) Abdominal Pain - Male [1] MODERATE pain (e.g., interferes with normal activities) AND [2] pain comes and goes (cramps) AND [3] Lowanda Foster 12/24/2022 3:19:26 PM PLEASE NOTE: All timestamps contained within this report are represented as [place] Standard Time. CONFIDENTIALTY NOTICE: This fax transmission is intended only for the addressee. It contains information that is legally privileged, confidential or otherwise protected from use or disclosure. If you are not the intended recipient, you are strictly prohibited from reviewing, disclosing, copying using or disseminating any of this information or taking any action in reliance on or regarding this information. If you have received this fax in error, please notify us immediately by telephone so that we can arrange for its return to [place]. Phone: [phone], Toll-Free: [phone], Fax:  [phone] Page: 2 of 2 Call Id: [id number] Guidelines Guideline Title Affirmed Question Affirmed Notes Nurse Date/Time Lamount Cohen Time) present > 24 hours (Exception: Pain with Vomiting or Diarrhea - see that Guideline.) Disp. Time Lamount Cohen Time) Disposition Final User 12/24/2022 3:23:32 PM See PCP within 24 Hours Yes Nicholaus Bloom RN, Judeth Cornfield Final Disposition 12/24/2022 3:23:32 PM See PCP within 24 Hours Yes Nicholaus Bloom, RN, Loura Halt Disagree/Comply Comply Caller Understands Yes PreDisposition Call Doctor Care Advice Given Per Guideline SEE PCP WITHIN 24 HOURS: DIET: * Drink adequate fluids. Eat a bland diet. * Avoid alcohol or caffeinated beverages. CALL BACK IF: * Severe pain lasts over 1 hour * Constant pain lasts over 2 hours * You become worse CARE ADVICE given per Abdominal Pain - Male (Adult) guideline. Comments User: Bing Matter, RN Date/Time Lamount Cohen Time): 12/24/2022 3:15:29 PM call disconnected Referrals REFERRED TO PCP OFFICE

## 2022-12-25 NOTE — Consult Note (Signed)
Chief Complaint: Patient was seen in consultation today for liver lesions  Referring Physician(s): Mir Kirby Crigler, MD  Supervising Physician: Irish Lack  Patient Status: [facility] - In-pt  History of Present Illness: Peter Camacho is a 62 y.o. male with PMH significant for asthma, hypertension, obesity, and prostate cancer being seen today in relation recently discovered liver lesions. Patient presented to [facility] ED on 10/27 with abdominal pain. CT revealed multiple liver lesions concerning for metastatic disease, with largest lesion measuring 8.0 cm. IR was consulted for image-guided liver biopsy.   Past Medical History:  Diagnosis Date   Allergy    Asthma    At risk for sleep apnea    STOP-BANG= 5    SENT TO PCP 03-17-2014   GI bleed    Hard of hearing    History of seizure    HX menigitis x2  in middle and high school - residual seizure's on medications--  last seizure age 44   Hypertension    Obesity    Personal history of meningitis    X2  MIDDLE AND HIGH SCHOOL--  residual seizure's on medication -- last seizure age 89   Prostate cancer (HCC) MONITORED BY DR OTTELIN   stage T1c, Gleason 3+4, PSA 12.79, vol 38.5cc    Past Surgical History:  Procedure Laterality Date   ESOPHAGOGASTRODUODENOSCOPY (EGD) WITH PROPOFOL N/A 08/10/2019   Procedure: ESOPHAGOGASTRODUODENOSCOPY (EGD) WITH PROPOFOL;  Surgeon: Meryl Dare, MD;  Location: [facility];  Service: Endoscopy;  Laterality: N/A;   HEMOSTASIS CONTROL  08/10/2019   Procedure: HEMOSTASIS CONTROL;  Surgeon: Meryl Dare, MD;  Location: [facility];  Service: Endoscopy;;   HOT HEMOSTASIS N/A 08/10/2019   Procedure: HOT HEMOSTASIS (ARGON PLASMA COAGULATION/BICAP);  Surgeon: Meryl Dare, MD;  Location: [facility];  Service: Endoscopy;  Laterality: N/A;   PROSTATE BIOPSY     RADIOACTIVE SEED IMPLANT N/A 03/20/2014   Procedure: RADIOACTIVE SEED IMPLANT;  Surgeon: Garnett Farm, MD;  Location: [facility];  Service: Urology;  Laterality: N/A;   SCLEROTHERAPY  08/10/2019   Procedure: SCLEROTHERAPY;  Surgeon: Meryl Dare, MD;  [facility]: [facility];  Service: Endoscopy;;    Allergies: Ace inhibitors, Eicosapentaenoic acid, and Fish-derived products  Medications: Prior to Admission medications   Medication Sig Start Date End Date Taking? Authorizing Provider  acetaminophen (TYLENOL) 500 MG tablet Take 500 mg by mouth every 6 (six) hours as needed for mild pain, moderate pain, fever or headache.    [provider]  albuterol (VENTOLIN HFA) 108 (90 Base) MCG/ACT inhaler Inhale 2 puffs into the lungs every 6 (six) hours as needed for wheezing or shortness of breath. 10/20/22   Wendling, Jilda Roche, DO  amLODipine (NORVASC) 5 MG tablet TAKE 1 TABLET (5 MG TOTAL) BY MOUTH DAILY. 05/01/22   Sharlene Dory, DO  azithromycin (ZITHROMAX) 250 MG tablet Take 1 tablet (250 mg total) by mouth daily. Take first 2 tablets together, then 1 every day until finished. Patient not taking: Reported on 12/25/2022 09/10/22   Al Decant, PA-C  cetirizine (ZYRTEC) 10 MG tablet Take 1 tablet (10 mg total) by mouth daily. 06/29/22   Clayborne Dana, NP  fluticasone (FLONASE) 50 MCG/ACT nasal spray Place 2 sprays into both nostrils daily. 06/29/22   Clayborne Dana, NP  hydrochlorothiazide (HYDRODIURIL) 25 MG tablet TAKE 1 TABLET (25 MG TOTAL) BY MOUTH DAILY. 08/02/22 10/31/22  Sharlene Dory, DO  metFORMIN (GLUCOPHAGE-XR) 500 MG 24 hr tablet  Take 1 tablet (500 mg total) by mouth daily with breakfast. 09/18/22   Carmelia Roller, Jilda Roche, DO  montelukast (SINGULAIR) 10 MG tablet Take 1 tablet (10 mg total) by mouth at bedtime. 06/29/22   Clayborne Dana, NP  Multiple Vitamin (MULTIVITAMIN WITH MINERALS) TABS tablet Take 1 tablet by mouth daily.    [provider]  olopatadine (PATADAY) 0.1 % ophthalmic solution Place 1 drop into both eyes 2 (two) times daily. 06/29/22   Clayborne Dana,  NP  predniSONE (DELTASONE) 20 MG tablet Take 2 tablets (40 mg total) by mouth daily. 09/10/22   Al Decant, PA-C  triamcinolone cream (KENALOG) 0.5 % Apply 1 application on to the skin 2 (two) times daily. 09/13/22   Sharlene Dory, DO     Family History  Problem Relation Age of Onset   CAD Mother 55   Diabetes Mother    CAD Father 52   Cancer Neg Hx     Social History   Socioeconomic History   Marital status: Single    Spouse name: Not on file   Number of children: Not on file   Years of education: Not on file   Highest education level: Not on file  Occupational History   Occupation: metal heat treating  Tobacco Use   Smoking status: Some Days    Current packs/day: 0.30    Average packs/day: 0.3 packs/day for 40.0 years (12.0 ttl pk-yrs)    Types: Cigarettes   Smokeless tobacco: Never   Tobacco comments:    quit 11/11/16  Vaping Use   Vaping status: Never Used  Substance and Sexual Activity   Alcohol use: No   Drug use: No   Sexual activity: Not Currently    Birth control/protection: None  Other Topics Concern   Not on file  Social History Narrative   Not on file   Social Determinants of Health   Financial Resource Strain: Not on file  Food Insecurity: No Food Insecurity (12/25/2022)   Hunger Vital Sign    Worried About Running Out of Food in the Last Year: Never true    Ran Out of Food in the Last Year: Never true  Transportation Needs: No Transportation Needs (12/25/2022)   PRAPARE - Administrator, Civil Service (Medical): No    Lack of Transportation (Non-Medical): No  Physical Activity: Not on file  Stress: Not on file  Social Connections: Not on file    Code Status: Full code  Review of Systems: A 12 point ROS discussed and pertinent positives are indicated in the HPI above.  All other systems are negative.  Review of Systems  Constitutional:  Positive for fever. Negative for chills.  Respiratory:  Positive for  shortness of breath. Negative for chest tightness.   Cardiovascular:  Negative for chest pain and leg swelling.  Gastrointestinal:  Positive for abdominal pain and diarrhea. Negative for nausea and vomiting.  Neurological:  Negative for dizziness and headaches.  Psychiatric/Behavioral:  Negative for confusion.     Vital Signs: BP 117/87 (BP Location: Left Arm)   Pulse 99   Temp 97.8 F (36.6 C) (Oral)   Resp 17   Ht 6\' 2"  (1.88 m)   Wt (!) 314 lb (142.4 kg)   SpO2 98%   BMI 40.32 kg/m     Physical Exam Vitals reviewed.  Constitutional:      General: He is not in acute distress.    Appearance: He is ill-appearing.  HENT:  Mouth/Throat:     Mouth: Mucous membranes are moist.  Cardiovascular:     Rate and Rhythm: Normal rate and regular rhythm.     Pulses: Normal pulses.     Heart sounds: Normal heart sounds.     Comments: Patient not tachycardic at time of exam, has had documented tachycardia earlier today Pulmonary:     Breath sounds: Normal breath sounds.     Comments: Patient on 3L O2 via  at time of exam Abdominal:     Palpations: Abdomen is soft.     Tenderness: There is no abdominal tenderness.     Comments: Patient without tenderness to palpation, but reported spontaneous abdominal pain while auscultating heart sounds  Musculoskeletal:     Right lower leg: No edema.     Left lower leg: No edema.  Skin:    General: Skin is warm and dry.  Neurological:     Mental Status: He is alert and oriented to person, place, and time.  Psychiatric:        Mood and Affect: Mood normal.        Behavior: Behavior normal.        Thought Content: Thought content normal.        Judgment: Judgment normal.     Imaging: DG Chest 2 View  Result Date: 12/24/2022 CLINICAL DATA:  Severe right-sided abdominal pain and tachycardia EXAM: CHEST - 2 VIEW COMPARISON:  09/10/2022 FINDINGS: Stable cardiomediastinal silhouette. Aortic atherosclerotic calcification. Left basilar  atelectasis. Otherwise no focal consolidation, pleural effusion, or pneumothorax. No displaced rib fractures. IMPRESSION: No acute cardiopulmonary disease. Electronically Signed   By: Minerva Fester M.D.   On: 12/24/2022 23:50   CT ABDOMEN PELVIS W CONTRAST  Result Date: 12/24/2022 CLINICAL DATA:  Right-sided abdominal pain EXAM: CT ABDOMEN AND PELVIS WITH CONTRAST TECHNIQUE: Multidetector CT imaging of the abdomen and pelvis was performed using the standard protocol following bolus administration of intravenous contrast. RADIATION DOSE REDUCTION: This exam was performed according to the departmental dose-optimization program which includes automated exposure control, adjustment of the mA and/or kV according to patient size and/or use of iterative reconstruction technique. CONTRAST:  OMNIPAQUE IOHEXOL 300 MG/ML  SOLN COMPARISON:  11/21/2013 CT pelvis FINDINGS: Lower chest: No acute abnormality. Hepatobiliary: Numerous hypoattenuating lesions throughout the liver measuring up to 8.0 cm in the right hepatic lobe. Unremarkable gallbladder and biliary tree. Pancreas: Unremarkable. Spleen: Unremarkable. Adrenals/Urinary Tract: Normal adrenal glands. Punctate nonobstructing bilateral nephrolithiasis. No obstructing urinary calculi or hydronephrosis. Unremarkable bladder. Stomach/Bowel: Normal caliber large and small bowel. Colonic diverticulosis greatest about the transverse colon. Possible mild wall thickening versus underdistention about the transverse colon. Normal appendix. Stomach is within normal limits. Vascular/Lymphatic: No acute vascular abnormality. 1.9 cm precaval lymph node (series 301/image 42). Reproductive: Metallic seeds in the prostate. Other: No free intraperitoneal fluid or air. Musculoskeletal: No acute fracture or destructive osseous lesion. IMPRESSION: 1. Numerous hypoattenuating lesions throughout the liver measuring up to 8.0 cm in the right hepatic lobe, concerning for metastases of  unknown primary. 2. 1.9 cm precaval lymph node, suspicious for metastatic disease. 3. Punctate nonobstructing bilateral nephrolithiasis. 4. Colonic diverticulosis greatest about the transverse colon where there is mild wall thickening. This could be due to mild diverticulitis or underdistention. Electronically Signed   By: Minerva Fester M.D.   On: 12/24/2022 23:49    Labs:  CBC: Recent Labs    09/10/22 1517 12/24/22 2155 12/25/22 0407  WBC 11.4* 13.4* 13.1*  HGB 14.5 13.4 12.4*  HCT 44.1 40.3 38.8*  PLT 309 268 242    COAGS: No results for input(s): "INR", "APTT" in the last 8760 hours.  BMP: Recent Labs    09/10/22 1517 09/13/22 1151 12/24/22 2155 12/25/22 0407  NA 137 140 136 136  K 3.3* 4.0 3.9 3.8  CL 97* 100 100 102  CO2 29 31 23 24   GLUCOSE 98 109* 109* 113*  BUN 12 20 11 14   CALCIUM 9.0 9.4 8.6* 8.5*  CREATININE 0.81 0.99 0.91 0.97  GFRNONAA >60  --  >60 >60    LIVER FUNCTION TESTS: Recent Labs    09/13/22 1151 12/24/22 2155 12/25/22 0407  BILITOT 0.3 1.2 1.2  AST 13 119* 90*  ALT 15 164* 142*  ALKPHOS 113 307* 280*  PROT 7.4 7.9 7.3  ALBUMIN 3.9 3.2* 2.9*    TUMOR MARKERS: No results for input(s): "AFPTM", "CEA", "CA199", "CHROMGRNA" in the last 8760 hours.  Assessment and Plan:  Brantlee Richendollar is a 62 yo male being seen today in relation to newly discovered liver lesions concerning for metastatic disease. Patient with multiple liver lesions up to approximately 8.0 cm in size discovered on CT AP on 12/24/22. Of note, there was initial concern on patient's presentation to ED for possible intra-abdominal infection. Per primary team, there is no longer concern for intra-abdominal infection as leukocytosis is felt to be due to malignancy and lactic acidosis due to mild liver dysfunction. IR was consulted to evaluate patient for biopsy. Case and imaging were reviewed by Dr Fredia Sorrow and approved for image-guided liver lesion biopsy tentatively for 12/25/22.  Leukocytosis of 13.1 today, Hgb of 12.4, INR of 1.3. Patient is NPO and not on any anticoagulation or antiplatelet medication.  Risks and benefits of image-guided liver biopsy was discussed with the patient and/or patient's family including, but not limited to bleeding, infection, damage to adjacent structures or low yield requiring additional tests.  All of the questions were answered and there is agreement to proceed.  Consent signed and in chart.   Thank you for this interesting consult.  I greatly enjoyed meeting Peter Camacho and look forward to participating in their care.  A copy of this report was sent to the requesting provider on this date.  Electronically Signed: Kennieth Francois, PA-C 12/25/2022, 10:59 AM   I spent a total of 40 Minutes  in face to face in clinical consultation, greater than 50% of which was counseling/coordinating care for liver lesions.

## 2022-12-25 NOTE — Progress Notes (Signed)
   12/25/22 0205  Assess: MEWS Score  Temp 97.7 F (36.5 C)  BP 124/85  MAP (mmHg) 97  Pulse Rate (!) 122  Resp 20  SpO2 97 %  O2 Device Nasal Cannula  O2 Flow Rate (L/min) 3 L/min  Assess: MEWS Score  MEWS Temp 0  MEWS Systolic 0  MEWS Pulse 2  MEWS RR 0  MEWS LOC 0  MEWS Score 2  MEWS Score Color Yellow  Assess: if the MEWS score is Yellow or Red  Were vital signs accurate and taken at a resting state? Yes  Does the patient meet 2 or more of the SIRS criteria? No  MEWS guidelines implemented  Yes, yellow  Treat  MEWS Interventions Considered administering scheduled or prn medications/treatments as ordered  Take Vital Signs  Increase Vital Sign Frequency  Yellow: Q2hr x1, continue Q4hrs until patient remains green for 12hrs  Escalate  MEWS: Escalate Yellow: Discuss with charge nurse and consider notifying provider and/or RRT  Notify: Charge Nurse/RN  Name of Charge Nurse/RN Notified Lenard Simmer  Provider Notification  Provider Name/Title Dolly Rias  Date Provider Notified 12/25/22  Time Provider Notified 0209 (Admitting MD placing orders at this time.)  Method of Notification Page  Notification Reason Other (Comment) (yellow MEWS)  Provider response See new orders  Date of Provider Response 12/25/22  Time of Provider Response 0210  Notify: Rapid Response  Name of Rapid Response RN Notified Not needing to escalate at this time. Patient just admitted and was a yellow MEWS at [facility].  Assess: SIRS CRITERIA  SIRS Temperature  0  SIRS Pulse 1  SIRS Respirations  0  SIRS WBC 0  SIRS Score Sum  1

## 2022-12-25 NOTE — Procedures (Signed)
Interventional Radiology Procedure Note  Procedure: US Guided Biopsy of right lobe liver lesion  Complications: None  Estimated Blood Loss: < 10 mL  Findings: 18 G core biopsy of right lobe liver mass performed under US guidance.  Three core samples obtained and sent to Pathology.  Venetia Night. Kathlene Cote, M.D Pager:  [phone]

## 2022-12-25 NOTE — ED Notes (Signed)
ED TO INPATIENT HANDOFF REPORT  ED Nurse Name and Phone #: Hayden Pedro RN [phone]  S Name/Age/Gender Peter Camacho 62 y.o. male Room/Bed: MH01/MH01  Code Status   Code Status: Prior  Home/SNF/Other Home Patient oriented to: self, place, time, and situation Is this baseline? Yes   Triage Complete: Triage complete  Chief Complaint Metastatic cancer to liver [facility]) [C78.7]  Triage Note Pt states that his stomach has been "irritated" for two weeks and today started having R sided abd pain.    Allergies Allergies  Allergen Reactions   Ace Inhibitors Anaphylaxis   Fish-Derived Products Rash    Level of Care/Admitting Diagnosis ED Disposition     ED Disposition  Admit   Condition  --   Comment  Hospital Area: [facility] [[id number]]  Level of Care: Med-Surg [16]  Interfacility transfer: Yes  May place patient in observation at [facility] or [facility] if equivalent level of care is available:: Yes  Covid Evaluation: Asymptomatic - no recent exposure (last 10 days) testing not required  Diagnosis: Metastatic cancer to liver [facility]) [[id number]]  Admitting Physician: Arlean Hopping [[id number]]  Attending Physician: Arlean Hopping [[id number]]          B Medical/Surgery History Past Medical History:  Diagnosis Date   Allergy    Asthma    At risk for sleep apnea    STOP-BANG= 5    SENT TO PCP 03-17-2014   GI bleed    Hard of hearing    History of seizure    HX menigitis x2  in middle and high school - residual seizure's on medications--  last seizure age 89   Hypertension    Obesity    Personal history of meningitis    X2  MIDDLE AND HIGH SCHOOL--  residual seizure's on medication -- last seizure age 20   Prostate cancer (HCC) MONITORED BY DR OTTELIN   stage T1c, Gleason 3+4, PSA 12.79, vol 38.5cc   Past Surgical History:  Procedure Laterality Date   ESOPHAGOGASTRODUODENOSCOPY (EGD) WITH PROPOFOL N/A 08/10/2019   Procedure:  ESOPHAGOGASTRODUODENOSCOPY (EGD) WITH PROPOFOL;  Surgeon: Meryl Dare, MD;  [facility];  Service: Endoscopy;  Laterality: N/A;   HEMOSTASIS CONTROL  08/10/2019   Procedure: HEMOSTASIS CONTROL;  Surgeon: Meryl Dare, MD;  [facility];  Service: Endoscopy;;   HOT HEMOSTASIS N/A 08/10/2019   Procedure: HOT HEMOSTASIS (ARGON PLASMA COAGULATION/BICAP);  Surgeon: Meryl Dare, MD;  Location: [facility];  Service: Endoscopy;  Laterality: N/A;   PROSTATE BIOPSY     RADIOACTIVE SEED IMPLANT N/A 03/20/2014   Procedure: RADIOACTIVE SEED IMPLANT;  Surgeon: Garnett Farm, MD;  Location: [facility];  Service: Urology;  Laterality: N/A;   SCLEROTHERAPY  08/10/2019   Procedure: SCLEROTHERAPY;  Surgeon: Meryl Dare, MD;  Location: [facility];  Service: Endoscopy;;     A IV Location/Drains/Wounds Patient Lines/Drains/Airways Status     Active Line/Drains/Airways     Name Placement date Placement time Site Days   Peripheral IV 12/24/22 20 G 1" Right Antecubital 12/24/22  2208  Antecubital  1            Intake/Output Last 24 hours No intake or output data in the 24 hours ending 12/25/22 0047  Labs/Imaging Results for orders placed or performed during the hospital encounter of 12/24/22 (from the past 48 hour(s))  Lipase, blood     Status: None   Collection Time: 12/24/22  9:55 PM  Result  Value Ref Range   Lipase 23 11 - 51 U/L    Comment: Performed at [facility], [address] [place], [place] [address]  Comprehensive metabolic panel     Status: Abnormal   Collection Time: 12/24/22  9:55 PM  Result Value Ref Range   Sodium 136 135 - 145 mmol/L   Potassium 3.9 3.5 - 5.1 mmol/L   Chloride 100 98 - 111 mmol/L   CO2 23 22 - 32 mmol/L   Glucose, Bld 109 (H) 70 - 99 mg/dL    Comment: Glucose reference range applies only to samples taken after fasting for at least 8 hours.   BUN 11 8 - 23 mg/dL   Creatinine, Ser 4.78 0.61 -  1.24 mg/dL   Calcium 8.6 (L) 8.9 - 10.3 mg/dL   Total Protein 7.9 6.5 - 8.1 g/dL   Albumin 3.2 (L) 3.5 - 5.0 g/dL   AST 295 (H) 15 - 41 U/L   ALT 164 (H) 0 - 44 U/L   Alkaline Phosphatase 307 (H) 38 - 126 U/L   Total Bilirubin 1.2 0.3 - 1.2 mg/dL   GFR, Estimated >62 >13 mL/min    Comment: (NOTE) Calculated using the CKD-EPI Creatinine Equation (2021)    Anion gap 13 5 - 15    Comment: Performed at [facility], [address] [place], [place] [address]  CBC     Status: Abnormal   Collection Time: 12/24/22  9:55 PM  Result Value Ref Range   WBC 13.4 (H) 4.0 - 10.5 K/uL   RBC 4.54 4.22 - 5.81 MIL/uL   Hemoglobin 13.4 13.0 - 17.0 g/dL   HCT 84.6 96.2 - 95.2 %   MCV 88.8 80.0 - 100.0 fL   MCH 29.5 26.0 - 34.0 pg   MCHC 33.3 30.0 - 36.0 g/dL   RDW 84.1 32.4 - 40.1 %   Platelets 268 150 - 400 K/uL   nRBC 0.0 0.0 - 0.2 %    Comment: Performed at [facility], [address] [place], [place] [address]   DG Chest 2 View  Result Date: 12/24/2022 CLINICAL DATA:  Severe right-sided abdominal pain and tachycardia EXAM: CHEST - 2 VIEW COMPARISON:  09/10/2022 FINDINGS: Stable cardiomediastinal silhouette. Aortic atherosclerotic calcification. Left basilar atelectasis. Otherwise no focal consolidation, pleural effusion, or pneumothorax. No displaced rib fractures. IMPRESSION: No acute cardiopulmonary disease. Electronically Signed   By: Minerva Fester M.D.   On: 12/24/2022 23:50   CT ABDOMEN PELVIS W CONTRAST  Result Date: 12/24/2022 CLINICAL DATA:  Right-sided abdominal pain EXAM: CT ABDOMEN AND PELVIS WITH CONTRAST TECHNIQUE: Multidetector CT imaging of the abdomen and pelvis was performed using the standard protocol following bolus administration of intravenous contrast. RADIATION DOSE REDUCTION: This exam was performed according to the departmental dose-optimization program which includes automated exposure control, adjustment of the mA and/or kV according to  patient size and/or use of iterative reconstruction technique. CONTRAST:  OMNIPAQUE IOHEXOL 300 MG/ML  SOLN COMPARISON:  11/21/2013 CT pelvis FINDINGS: Lower chest: No acute abnormality. Hepatobiliary: Numerous hypoattenuating lesions throughout the liver measuring up to 8.0 cm in the right hepatic lobe. Unremarkable gallbladder and biliary tree. Pancreas: Unremarkable. Spleen: Unremarkable. Adrenals/Urinary Tract: Normal adrenal glands. Punctate nonobstructing bilateral nephrolithiasis. No obstructing urinary calculi or hydronephrosis. Unremarkable bladder. Stomach/Bowel: Normal caliber large and small bowel. Colonic diverticulosis greatest about the transverse colon. Possible mild wall thickening versus underdistention about the transverse colon. Normal appendix. Stomach is  within normal limits. Vascular/Lymphatic: No acute vascular abnormality. 1.9 cm precaval lymph node (series 301/image 42). Reproductive: Metallic seeds in the prostate. Other: No free intraperitoneal fluid or air. Musculoskeletal: No acute fracture or destructive osseous lesion. IMPRESSION: 1. Numerous hypoattenuating lesions throughout the liver measuring up to 8.0 cm in the right hepatic lobe, concerning for metastases of unknown primary. 2. 1.9 cm precaval lymph node, suspicious for metastatic disease. 3. Punctate nonobstructing bilateral nephrolithiasis. 4. Colonic diverticulosis greatest about the transverse colon where there is mild wall thickening. This could be due to mild diverticulitis or underdistention. Electronically Signed   By: Minerva Fester M.D.   On: 12/24/2022 23:49    Pending Labs Unresulted Labs (From admission, onward)     Start     Ordered   12/24/22 2134  Urinalysis, Routine w reflex microscopic -Urine, Clean Catch  Once,   URGENT       Question:  Specimen Source  Answer:  Urine, Clean Catch   12/24/22 2133            Vitals/Pain Today's Vitals   12/24/22 2131 12/24/22 2156 12/25/22 0042  BP:  129/88    Pulse: (!) 122    Resp: 20    Temp: 98.6 F (37 C)    TempSrc: Oral    SpO2: 98%  95%  Weight: (!) 142.4 kg    Height: 6\' 2"  (1.88 m)    PainSc: 9  9      Isolation Precautions No active isolations  Medications Medications  oxyCODONE (Oxy IR/ROXICODONE) immediate release tablet 10 mg (has no administration in time range)  lactated ringers bolus 1,000 mL (1,000 mLs Intravenous New Bag/Given 12/24/22 2216)  morphine (PF) 4 MG/ML injection 4 mg (4 mg Intravenous Given 12/24/22 2212)  iohexol (OMNIPAQUE) 300 MG/ML solution 100 mL (100 mLs Intravenous Contrast Given 12/24/22 2329)  HYDROmorphone (DILAUDID) injection 1 mg (1 mg Intravenous Given 12/24/22 2350)  ondansetron (ZOFRAN) injection 4 mg (4 mg Intravenous Given 12/24/22 2349)  ipratropium-albuterol (DUONEB) 0.5-2.5 (3) MG/3ML nebulizer solution 3 mL (3 mLs Nebulization Given 12/25/22 0041)  albuterol (PROVENTIL) (2.5 MG/3ML) 0.083% nebulizer solution 2.5 mg (2.5 mg Nebulization Given 12/25/22 0041)    Mobility walks with person assist     Focused Assessments Pulmonary Assessment Handoff:  Lung sounds:   O2 Device: Nasal Cannula O2 Flow Rate (L/min): 2 L/min Gastrointestinal  assessment-  Obese abd., Hyper active bowel sound. Audible in all 4 quad. Pain in rt and left upper quad.    R Recommendations: See Admitting Provider Note  Report given to:   Additional Notes:   Patient does not have family at bedside.

## 2022-12-26 DIAGNOSIS — Z6841 Body Mass Index (BMI) 40.0 and over, adult: Secondary | ICD-10-CM | POA: Diagnosis not present

## 2022-12-26 DIAGNOSIS — Z7984 Long term (current) use of oral hypoglycemic drugs: Secondary | ICD-10-CM | POA: Diagnosis not present

## 2022-12-26 DIAGNOSIS — Z91013 Allergy to seafood: Secondary | ICD-10-CM | POA: Diagnosis not present

## 2022-12-26 DIAGNOSIS — R160 Hepatomegaly, not elsewhere classified: Secondary | ICD-10-CM | POA: Diagnosis not present

## 2022-12-26 DIAGNOSIS — E860 Dehydration: Secondary | ICD-10-CM | POA: Diagnosis present

## 2022-12-26 DIAGNOSIS — Z79899 Other long term (current) drug therapy: Secondary | ICD-10-CM | POA: Diagnosis not present

## 2022-12-26 DIAGNOSIS — R112 Nausea with vomiting, unspecified: Secondary | ICD-10-CM | POA: Diagnosis present

## 2022-12-26 DIAGNOSIS — E872 Acidosis, unspecified: Secondary | ICD-10-CM | POA: Diagnosis present

## 2022-12-26 DIAGNOSIS — Z8546 Personal history of malignant neoplasm of prostate: Secondary | ICD-10-CM | POA: Diagnosis not present

## 2022-12-26 DIAGNOSIS — Z515 Encounter for palliative care: Secondary | ICD-10-CM | POA: Diagnosis not present

## 2022-12-26 DIAGNOSIS — Z8661 Personal history of infections of the central nervous system: Secondary | ICD-10-CM | POA: Diagnosis not present

## 2022-12-26 DIAGNOSIS — J439 Emphysema, unspecified: Secondary | ICD-10-CM | POA: Diagnosis present

## 2022-12-26 DIAGNOSIS — F17210 Nicotine dependence, cigarettes, uncomplicated: Secondary | ICD-10-CM | POA: Diagnosis present

## 2022-12-26 DIAGNOSIS — N390 Urinary tract infection, site not specified: Secondary | ICD-10-CM | POA: Diagnosis present

## 2022-12-26 DIAGNOSIS — C787 Secondary malignant neoplasm of liver and intrahepatic bile duct: Secondary | ICD-10-CM | POA: Diagnosis present

## 2022-12-26 DIAGNOSIS — I10 Essential (primary) hypertension: Secondary | ICD-10-CM | POA: Diagnosis present

## 2022-12-26 DIAGNOSIS — J90 Pleural effusion, not elsewhere classified: Secondary | ICD-10-CM | POA: Diagnosis not present

## 2022-12-26 DIAGNOSIS — H9190 Unspecified hearing loss, unspecified ear: Secondary | ICD-10-CM | POA: Diagnosis present

## 2022-12-26 DIAGNOSIS — R918 Other nonspecific abnormal finding of lung field: Secondary | ICD-10-CM | POA: Diagnosis not present

## 2022-12-26 DIAGNOSIS — Z888 Allergy status to other drugs, medicaments and biological substances status: Secondary | ICD-10-CM | POA: Diagnosis not present

## 2022-12-26 DIAGNOSIS — Z8249 Family history of ischemic heart disease and other diseases of the circulatory system: Secondary | ICD-10-CM | POA: Diagnosis not present

## 2022-12-26 DIAGNOSIS — K7689 Other specified diseases of liver: Secondary | ICD-10-CM | POA: Diagnosis not present

## 2022-12-26 DIAGNOSIS — Z23 Encounter for immunization: Secondary | ICD-10-CM | POA: Diagnosis not present

## 2022-12-26 DIAGNOSIS — D630 Anemia in neoplastic disease: Secondary | ICD-10-CM | POA: Diagnosis present

## 2022-12-26 DIAGNOSIS — Z833 Family history of diabetes mellitus: Secondary | ICD-10-CM | POA: Diagnosis not present

## 2022-12-26 DIAGNOSIS — J9811 Atelectasis: Secondary | ICD-10-CM | POA: Diagnosis present

## 2022-12-26 LAB — BASIC METABOLIC PANEL
Anion gap: 13 (ref 5–15)
BUN: 20 mg/dL (ref 8–23)
CO2: 25 mmol/L (ref 22–32)
Calcium: 8.9 mg/dL (ref 8.9–10.3)
Chloride: 100 mmol/L (ref 98–111)
Creatinine, Ser: 1.01 mg/dL (ref 0.61–1.24)
GFR, Estimated: >60 mL/min (ref >60)
Glucose, Bld: 104 mg/dL — ABNORMAL HIGH (ref 70–99)
Potassium: 4 mmol/L (ref 3.5–5.1)
Sodium: 138 mmol/L (ref 135–145)

## 2022-12-26 LAB — GLUCOSE, CAPILLARY
Glucose-Capillary: 107 mg/dL — ABNORMAL HIGH (ref 70–99)
Glucose-Capillary: 116 mg/dL — ABNORMAL HIGH (ref 70–99)
Glucose-Capillary: 105 mg/dL — ABNORMAL HIGH (ref 70–99)
Glucose-Capillary: 104 mg/dL — ABNORMAL HIGH (ref 70–99)

## 2022-12-26 LAB — CBC
HCT: 40.7 % (ref 39.0–52.0)
Hemoglobin: 12.7 g/dL — ABNORMAL LOW (ref 13.0–17.0)
MCH: 29.5 pg (ref 26.0–34.0)
MCHC: 31.2 g/dL (ref 30.0–36.0)
MCV: 94.7 fL (ref 80.0–100.0)
Platelets: 261 10*3/uL (ref 150–400)
RBC: 4.3 MIL/uL (ref 4.22–5.81)
RDW: 15.7 % — ABNORMAL HIGH (ref 11.5–15.5)
WBC: 11.3 10*3/uL — ABNORMAL HIGH (ref 4.0–10.5)
nRBC: 0 % (ref 0.0–0.2)

## 2022-12-26 MED ORDER — OXYCODONE HCL 5 MG PO TABS
10.0000 mg | ORAL_TABLET | ORAL | Status: DC | PRN
  Administered 2022-12-26 (×2): 15 mg via ORAL
  Filled 2022-12-26 (×2): qty 3

## 2022-12-26 MED ORDER — PROMETHAZINE HCL 25 MG/ML IJ SOLN
12.5000 mg | Freq: Four times a day (QID) | INTRAMUSCULAR | Status: DC | PRN
  Administered 2022-12-26: 12.5 mg via INTRAMUSCULAR
  Filled 2022-12-26 (×2): qty 1

## 2022-12-26 MED ORDER — DEXTROSE-SODIUM CHLORIDE 5-0.45 % IV SOLN: Freq: Once | INTRAVENOUS | Status: AC

## 2022-12-26 MED ORDER — HYDROMORPHONE HCL 1 MG/ML IJ SOLN
0.5000 mg | INTRAMUSCULAR | Status: DC | PRN
  Administered 2022-12-27: 1 mg via INTRAVENOUS
  Filled 2022-12-26: qty 1

## 2022-12-26 NOTE — Progress Notes (Signed)
PROGRESS NOTE    Peter Camacho  NUU:[id number] DOB: 1960-06-17 DOA: 12/24/2022 PCP: Sharlene Dory, DO   Brief Narrative:  Peter Camacho is a 62 y.o. male with medical history significant for hypertension, obesity, prostate cancer treated with prostate seed implants - been admitted to the hospital with intractable abdominal pain nausea and vomiting found to have large liver mass.  Assessment & Plan:   Principal Problem:   Metastatic cancer to liver [facility]) Active Problems:   Intractable nausea and vomiting  Intractable abdominal pain, nausea, vomiting -Currently n.p.o., unable to tolerate p.o. food or medications today -questionably secondary to new mass as below -Continue D5 half-normal saline x 1 L, reassess once completed -P.o. oxycodone increased, now given poor p.o. status will add breakthrough Dilaudid IV -wean as appropriate once able to tolerate p.o. safely -Continue supportive care, Zofran, Phenergan alternating  New liver mass Prior history of prostate cancer -Unclear etiology, liver biopsy performed 12/25/2022 -Hold off on oncology referral until biopsy reported, will likely need to be set up for outpatient follow-up (prostate cancer treated 8-9 years ago with [facility] Manning/urology Dr. Vernie Ammons).  Reactive leukocytosis Lactic acidosis -Continue to follow, downtrending appropriately, presumed reactive given above mass, dehydration and poor p.o. intake  Hypertension -Currently well controlled off home medications (holding hydrochlorothiazide)  Obesity, morbid Body mass index is 40.32 kg/m. Discussed diet, weight loss, activity regimen   DVT prophylaxis: SCDs Start: 12/25/22 0908   Code Status:   Code Status: Full Code Family Communication: Discussed with girlfriend over the phone  Status is: Inpatient  Dispo: The patient is from: Home              Anticipated d/c is to: Home              Anticipated d/c date is: 24 to 48 hours               Patient currently not medically stable for discharge given poorly controlled pain, intractable nausea vomiting unable to tolerate p.o. safely.  Consultants:  None  Procedures:  Liver biopsy 10/28  Antimicrobials:  None indicated  Subjective: No acute issues or events overnight, unfortunately this afternoon patient's p.o. status worsens unable to tolerate medication, liquid, or food.  Objective: Vitals:   12/25/22 1411 12/25/22 2103 12/26/22 0451 12/26/22 1328  BP: 119/81 114/69 113/74 132/82  Pulse: (!) 109 (!) 101  (!) 110  Resp: 20 16 16 20   Temp: (!) 97.5 F (36.4 C) 98.7 F (37.1 C) (!) 97.5 F (36.4 C) 97.7 F (36.5 C)  TempSrc: Oral Oral Oral Oral  SpO2:  96% 90% (!) 89%  Weight:      Height:        Intake/Output Summary (Last 24 hours) at 12/26/2022 1418 Last data filed at 12/26/2022 1337 Gross per 24 hour  Intake 1025.03 ml  Output 500 ml  Net 525.03 ml   Filed Weights   12/24/22 2131  Weight: (!) 142.4 kg    Examination:  General: Appears uncomfortable sitting up in bed attempting to tolerate breakfast HEENT:  Normocephalic atraumatic.  Sclerae nonicteric, noninjected.  Extraocular movements intact bilaterally. Neck:  Without mass or deformity.  Trachea is midline. Lungs:  Clear to auscultate bilaterally without rhonchi, wheeze, or rales. Heart:  Regular rate and rhythm.  Without murmurs, rubs, or gallops. Abdomen:  Soft, diffusely tender, nondistended, obese Extremities: Without cyanosis, clubbing, edema, or obvious deformity. Skin:  Warm and dry, no erythema.  Data Reviewed: I have personally reviewed  following labs and imaging studies  CBC: Recent Labs  Lab 12/24/22 2155 12/25/22 0407 12/26/22 0546  WBC 13.4* 13.1* 11.3*  HGB 13.4 12.4* 12.7*  HCT 40.3 38.8* 40.7  MCV 88.8 92.6 94.7  PLT 268 242 261   Basic Metabolic Panel: Recent Labs  Lab 12/24/22 2155 12/25/22 0407 12/26/22 0546  NA 136 136 138  K 3.9 3.8 4.0  CL 100 102 100   CO2 23 24 25   GLUCOSE 109* 113* 104*  BUN 11 14 20   CREATININE 0.91 0.97 1.01  CALCIUM 8.6* 8.5* 8.9  MG  --  1.9  --   PHOS  --  3.9  --    GFR: Estimated Creatinine Clearance: 112.6 mL/min (by C-G formula based on SCr of 1.01 mg/dL). Liver Function Tests: Recent Labs  Lab 12/24/22 2155 12/25/22 0407  AST 119* 90*  ALT 164* 142*  ALKPHOS 307* 280*  BILITOT 1.2 1.2  PROT 7.9 7.3  ALBUMIN 3.2* 2.9*   Recent Labs  Lab 12/24/22 2155  LIPASE 23   Coagulation Profile: Recent Labs  Lab 12/25/22 1012  INR 1.3*   CBG: Recent Labs  Lab 12/25/22 1137 12/25/22 1620 12/25/22 2104 12/26/22 0729 12/26/22 1109  GLUCAP 96 104* 98 107* 116*   Sepsis Labs: Recent Labs  Lab 12/25/22 0407 12/25/22 0536  LATICACIDVEN 1.9 2.1*   No results found for this or any previous visit (from the past 240 hour(s)).   Radiology Studies: US BIOPSY (LIVER)  Result Date: 12/25/2022 INDICATION: History of prostate carcinoma and diffuse masses throughout the liver parenchyma consistent with metastatic disease potentially of prostate or other origin. The patient presents for liver biopsy. EXAM: ULTRASOUND GUIDED CORE BIOPSY OF LIVER MASS MEDICATIONS: None. ANESTHESIA/SEDATION: Moderate (conscious) sedation was employed during this procedure. A total of Versed 1.0 mg and Fentanyl 50 mcg was administered intravenously. Moderate Sedation Time: 10 minutes. The patient's level of consciousness and vital signs were monitored continuously by radiology nursing throughout the procedure under my direct supervision. PROCEDURE: The procedure, risks, benefits, and alternatives were explained to the patient. Questions regarding the procedure were encouraged and answered. The patient understands and consents to the procedure. A time-out was performed prior to initiating the procedure. Ultrasound was performed of the liver to localize lesions. The abdominal wall was prepped with chlorhexidine in a sterile fashion,  and a sterile drape was applied covering the operative field. A sterile gown and sterile gloves were used for the procedure. Local anesthesia was provided with 1% Lidocaine. Under ultrasound guidance, a 17 gauge trocar needle was advanced into the right lobe of the liver. After confirming needle tip position, 3 separate coaxial 18 gauge core biopsy samples were obtained at the level of a lesion. Core biopsy samples were submitted in formalin. A slurry of Gel-Foam pledgets in sterile saline was then slowly injected as the outer needle was retracted and removed. Additional ultrasound was performed. COMPLICATIONS: None immediate. FINDINGS: A multitude of mass lesions are seen throughout the liver parenchyma. The largest confluent region of tumor in the right lobe was targeted measuring roughly 8 cm in diameter as seen by CT. Solid tissue was obtained. IMPRESSION: Ultrasound-guided core biopsy of tumor within the right lobe of the liver at the level of tumor mass measuring roughly 8 cm in diameter. Electronically Signed   By: Irish Lack M.D.   On: 12/25/2022 15:08   DG Chest 2 View  Result Date: 12/24/2022 CLINICAL DATA:  Severe right-sided abdominal pain and tachycardia EXAM:  CHEST - 2 VIEW COMPARISON:  09/10/2022 FINDINGS: Stable cardiomediastinal silhouette. Aortic atherosclerotic calcification. Left basilar atelectasis. Otherwise no focal consolidation, pleural effusion, or pneumothorax. No displaced rib fractures. IMPRESSION: No acute cardiopulmonary disease. Electronically Signed   By: Minerva Fester M.D.   On: 12/24/2022 23:50   CT ABDOMEN PELVIS W CONTRAST  Result Date: 12/24/2022 CLINICAL DATA:  Right-sided abdominal pain EXAM: CT ABDOMEN AND PELVIS WITH CONTRAST TECHNIQUE: Multidetector CT imaging of the abdomen and pelvis was performed using the standard protocol following bolus administration of intravenous contrast. RADIATION DOSE REDUCTION: This exam was performed according to the  departmental dose-optimization program which includes automated exposure control, adjustment of the mA and/or kV according to patient size and/or use of iterative reconstruction technique. CONTRAST:  OMNIPAQUE IOHEXOL 300 MG/ML  SOLN COMPARISON:  11/21/2013 CT pelvis FINDINGS: Lower chest: No acute abnormality. Hepatobiliary: Numerous hypoattenuating lesions throughout the liver measuring up to 8.0 cm in the right hepatic lobe. Unremarkable gallbladder and biliary tree. Pancreas: Unremarkable. Spleen: Unremarkable. Adrenals/Urinary Tract: Normal adrenal glands. Punctate nonobstructing bilateral nephrolithiasis. No obstructing urinary calculi or hydronephrosis. Unremarkable bladder. Stomach/Bowel: Normal caliber large and small bowel. Colonic diverticulosis greatest about the transverse colon. Possible mild wall thickening versus underdistention about the transverse colon. Normal appendix. Stomach is within normal limits. Vascular/Lymphatic: No acute vascular abnormality. 1.9 cm precaval lymph node (series 301/image 42). Reproductive: Metallic seeds in the prostate. Other: No free intraperitoneal fluid or air. Musculoskeletal: No acute fracture or destructive osseous lesion. IMPRESSION: 1. Numerous hypoattenuating lesions throughout the liver measuring up to 8.0 cm in the right hepatic lobe, concerning for metastases of unknown primary. 2. 1.9 cm precaval lymph node, suspicious for metastatic disease. 3. Punctate nonobstructing bilateral nephrolithiasis. 4. Colonic diverticulosis greatest about the transverse colon where there is mild wall thickening. This could be due to mild diverticulitis or underdistention. Electronically Signed   By: Minerva Fester M.D.   On: 12/24/2022 23:49    Scheduled Meds:  insulin aspart  0-15 Units Subcutaneous TID WC   insulin aspart  0-5 Units Subcutaneous QHS   montelukast  10 mg Oral QHS   Continuous Infusions:  dextrose 5 % and 0.45 % NaCl       LOS: 0 days   Time  spent:  Azucena Fallen, DO [facility]  If 7PM-7AM, please contact night-coverage [email or web address]  12/26/2022, 2:18 PM

## 2022-12-26 NOTE — TOC Initial Note (Signed)
Transition of Care [facility]) - Initial/Assessment Note    Patient Details  Name: Peter Camacho MRN: [id number] Date of Birth: [birth year]  Transition of Care [facility]) CM/SW Contact:    Howell Rucks, RN Phone Number: 12/26/2022, 2:06 PM  Clinical Narrative:  Met with pt at bedside to introduce role of TOC/NCM and review for dc planning, family members at bedside, reports pt hard of hearing and wears a hearing aid. Pt reports he has an established PCP and pharmacy in place, no current home care services, reports he has a cane at home he uses as needed, pt reports he feels safe returning home with support from family, family to provide transport at discharge. TOC will continue to follow.                  Expected Discharge Plan: Home/Self Care Barriers to Discharge: Continued Medical Work up   Patient Goals and CMS Choice Patient states their goals for this hospitalization and ongoing recovery are:: return home with family support          Expected Discharge Plan and Services       Living arrangements for the past 2 months: Single Family Home                                      Prior Living Arrangements/Services Living arrangements for the past 2 months: Single Family Home Lives with:: Self Patient language and need for interpreter reviewed:: Yes Do you feel safe going back to the place where you live?: Yes      Need for Family Participation in Patient Care: Yes (Comment) Care giver support system in place?: Yes (comment)   Criminal Activity/Legal Involvement Pertinent to Current Situation/Hospitalization: No - Comment as needed  Activities of Daily Living   ADL Screening (condition at time of admission) Independently performs ADLs?: Yes (appropriate for developmental age) Is the patient deaf or have difficulty hearing?: Yes Does the patient have difficulty seeing, even when wearing glasses/contacts?: Yes Does the patient have difficulty concentrating, remembering,  or making decisions?: No  Permission Sought/Granted                  Emotional Assessment Appearance:: Appears stated age Attitude/Demeanor/Rapport: Gracious Affect (typically observed): Accepting Orientation: : Oriented to Self, Oriented to Place, Oriented to  Time, Oriented to Situation Alcohol / Substance Use: Not Applicable Psych Involvement: No (comment)  Admission diagnosis:  Cancer, metastatic to liver [facility]) [C78.7] Metastatic cancer to liver [facility]) [C78.7] Patient Active Problem List   Diagnosis Date Noted   Metastatic cancer to liver (HCC) 12/25/2022   Mild intermittent asthma with (acute) exacerbation 05/09/2022   Seasonal and perennial allergic rhinitis 11/30/2020   Mild intermittent asthma without complication 11/30/2020   ACE inhibitor-aggravated angioedema 10/17/2020   Anaphylaxis 41/32/4401   LSC (lichen simplex chronicus) 07/16/2020   Anemia due to blood loss    Duodenal ulcer with hemorrhage    Piriformis syndrome of right side 01/15/2019   Sciatica of right side 09/30/2018   Severe obesity (BMI 35.0-35.9 with comorbidity) (HCC) 12/24/2017   Plantar fasciitis 08/09/2017   Seasonal allergies 08/09/2017   Lobar pneumonia (HCC) 11/30/2016   Sepsis due to pneumonia (HCC) 11/29/2016   Essential hypertension 11/29/2016   Tobacco dependence 11/29/2016   PCP:  Sharlene Dory, DO Pharmacy:   [facility] [address], [address] [place] [address] Phone: [phone] Fax: [phone]     Social Determinants of Health (SDOH) Social History: SDOH Screenings   Food Insecurity: No Food Insecurity (12/25/2022)  Housing: Low Risk  (12/25/2022)  Transportation Needs: No Transportation Needs (12/25/2022)  Utilities: Not At Risk (12/25/2022)  Depression (PHQ2-9): Low Risk  (09/13/2022)  Tobacco Use: High Risk (12/24/2022)   SDOH Interventions:     Readmission Risk Interventions     No data to  display

## 2022-12-26 NOTE — Plan of Care (Signed)

## 2022-12-27 ENCOUNTER — Inpatient Hospital Stay (HOSPITAL_COMMUNITY): Payer: 59

## 2022-12-27 DIAGNOSIS — E669 Obesity, unspecified: Secondary | ICD-10-CM | POA: Insufficient documentation

## 2022-12-27 DIAGNOSIS — C787 Secondary malignant neoplasm of liver and intrahepatic bile duct: Secondary | ICD-10-CM | POA: Diagnosis not present

## 2022-12-27 DIAGNOSIS — R160 Hepatomegaly, not elsewhere classified: Secondary | ICD-10-CM | POA: Insufficient documentation

## 2022-12-27 LAB — GLUCOSE, CAPILLARY
Glucose-Capillary: 95 mg/dL (ref 70–99)
Glucose-Capillary: 102 mg/dL — ABNORMAL HIGH (ref 70–99)
Glucose-Capillary: 104 mg/dL — ABNORMAL HIGH (ref 70–99)
Glucose-Capillary: 89 mg/dL (ref 70–99)

## 2022-12-27 LAB — CBC
HCT: 38 % — ABNORMAL LOW (ref 39.0–52.0)
Hemoglobin: 12 g/dL — ABNORMAL LOW (ref 13.0–17.0)
MCH: 29.6 pg (ref 26.0–34.0)
MCHC: 31.6 g/dL (ref 30.0–36.0)
MCV: 93.8 fL (ref 80.0–100.0)
Platelets: 243 10*3/uL (ref 150–400)
RBC: 4.05 MIL/uL — ABNORMAL LOW (ref 4.22–5.81)
RDW: 15.6 % — ABNORMAL HIGH (ref 11.5–15.5)
WBC: 10.5 10*3/uL (ref 4.0–10.5)
nRBC: 0 % (ref 0.0–0.2)

## 2022-12-27 LAB — COMPREHENSIVE METABOLIC PANEL
ALT: 95 U/L — ABNORMAL HIGH (ref 0–44)
AST: 75 U/L — ABNORMAL HIGH (ref 15–41)
Albumin: 2.6 g/dL — ABNORMAL LOW (ref 3.5–5.0)
Alkaline Phosphatase: 222 U/L — ABNORMAL HIGH (ref 38–126)
Anion gap: 9 (ref 5–15)
BUN: 19 mg/dL (ref 8–23)
CO2: 28 mmol/L (ref 22–32)
Calcium: 8.5 mg/dL — ABNORMAL LOW (ref 8.9–10.3)
Chloride: 96 mmol/L — ABNORMAL LOW (ref 98–111)
Creatinine, Ser: 1.03 mg/dL (ref 0.61–1.24)
GFR, Estimated: >60 mL/min (ref >60)
Glucose, Bld: 110 mg/dL — ABNORMAL HIGH (ref 70–99)
Potassium: 3.9 mmol/L (ref 3.5–5.1)
Sodium: 133 mmol/L — ABNORMAL LOW (ref 135–145)
Total Bilirubin: 1 mg/dL (ref 0.3–1.2)
Total Protein: 7.2 g/dL (ref 6.5–8.1)

## 2022-12-27 MED ORDER — ENOXAPARIN SODIUM 40 MG/0.4ML IJ SOSY
40.0000 mg | PREFILLED_SYRINGE | INTRAMUSCULAR | Status: DC
  Administered 2022-12-27: 40 mg via SUBCUTANEOUS
  Filled 2022-12-27: qty 0.4

## 2022-12-27 MED ORDER — IOHEXOL 300 MG/ML  SOLN
75.0000 mL | Freq: Once | INTRAMUSCULAR | Status: AC | PRN
  Administered 2022-12-27: 75 mL via INTRAVENOUS

## 2022-12-27 MED ORDER — OXYCODONE HCL 5 MG PO TABS
10.0000 mg | ORAL_TABLET | ORAL | Status: DC | PRN
  Administered 2022-12-27: 10 mg via ORAL
  Administered 2022-12-27: 15 mg via ORAL
  Administered 2022-12-28 – 2022-12-29 (×2): 10 mg via ORAL
  Administered 2022-12-29: 15 mg via ORAL
  Administered 2022-12-30: 10 mg via ORAL
  Filled 2022-12-27 (×3): qty 2
  Filled 2022-12-27: qty 3
  Filled 2022-12-27: qty 2
  Filled 2022-12-27: qty 3

## 2022-12-27 NOTE — Plan of Care (Signed)
  Problem: Education: Goal: Knowledge of General Education information will improve Description: Including pain rating scale, medication(s)/side effects and non-pharmacologic comfort measures Outcome: Progressing   Problem: Nutrition: Goal: Adequate nutrition will be maintained Outcome: Progressing   Problem: Coping: Goal: Level of anxiety will decrease Outcome: Progressing   Problem: Safety: Goal: Ability to remain free from injury will improve Outcome: Progressing   Problem: Metabolic: Goal: Ability to maintain appropriate glucose levels will improve Outcome: Progressing   Problem: Nutritional: Goal: Maintenance of adequate nutrition will improve Outcome: Progressing

## 2022-12-27 NOTE — Progress Notes (Signed)
Mobility Specialist - Progress Note   12/27/22 0957  Mobility  Activity Transferred from bed to chair  Level of Assistance Standby assist, set-up cues, supervision of patient - no hands on  Assistive Device Front wheel walker  Distance Ambulated (ft) 2 ft  Activity Response Tolerated well  Mobility Referral Yes  $Mobility charge 1 Mobility  Mobility Specialist Start Time (ACUTE ONLY) [id number]  Mobility Specialist Stop Time (ACUTE ONLY) 0957  Mobility Specialist Time Calculation (min) (ACUTE ONLY) 19 min   Pt received in bed and agreeable to transfer to recliner. Once standing pt had some LOB that was self corrected. No complaints during session. Pt to recliner after session with all needs met.    [facility]

## 2022-12-27 NOTE — Progress Notes (Addendum)
PROGRESS NOTE    Peter Camacho  WGN:[id number] DOB: 02-28-1961 DOA: 12/24/2022 PCP: Sharlene Dory, DO   Brief Narrative:  Peter Camacho is a 62 y.o. male with medical history significant for hypertension, obesity, prostate cancer treated with prostate seed implants - been admitted to the hospital with intractable abdominal pain nausea and vomiting found to have large liver mass.  Assessment & Plan:   Principal Problem:   Metastatic cancer to liver [facility]) Active Problems:   Intractable nausea and vomiting  Intractable abdominal pain, nausea, vomiting Ruq pain likely 2/2 liver masses. Nothing else acute seen on ct a/p -made npo yesterday, reports no vomiting since -will increase diet to full liquids today - if unable to advance diet may need to involve GI for consideration of EGD -P.o. oxycodone prn -Continue supportive care, Zofran, Phenergan alternating  New liver mass Prior history of prostate cancer -Unclear etiology, liver biopsy performed 12/25/2022 -prostate cancer treated 8-9 years ago with [facility] Manning/urology Dr. Vernie Ammons). - Dr. Cherly Hensen of oncology will see the patient tomorrow. He advises checking ca 19-9, cea, mri brain, and ct of the chest, which I've ordered  Reactive leukocytosis Lactic acidosis -Continue to follow, downtrending appropriately, presumed reactive given above mass, dehydration and poor p.o. intake  Hypertension -Currently well controlled off home medications (holding hydrochlorothiazide)  Obesity, morbid Body mass index is 40.32 kg/m. Discussed diet, weight loss, activity regimen   DVT prophylaxis: lovenox   Code Status:   Code Status: Full Code Family Communication: Discussed with girlfriend over the phone  Status is: Inpatient  Dispo: The patient is from: Home              Anticipated d/c is to: Home              Anticipated d/c date is: 24 to 48 hours              Patient currently not medically stable for  discharge given advancing diet  Consultants:  None  Procedures:  Liver biopsy 10/28  Antimicrobials:  None indicated  Subjective: Reports ruq pain improving with the pain meds we're giving, no vomiting overnight or this morning  Objective: Vitals:   12/26/22 1328 12/26/22 2100 12/27/22 0435 12/27/22 1254  BP: 132/82 133/77 118/78 132/81  Pulse: (!) 110   (!) 115  Resp: 20 16 18 18   Temp: 97.7 F (36.5 C) 97.9 F (36.6 C) 99.7 F (37.6 C) 97.8 F (36.6 C)  TempSrc: Oral Oral Oral Oral  SpO2: (!) 89% 90% 96% 94%  Weight:      Height:        Intake/Output Summary (Last 24 hours) at 12/27/2022 1358 Last data filed at 12/27/2022 1254 Gross per 24 hour  Intake 108.09 ml  Output 1000 ml  Net -891.91 ml   Filed Weights   12/24/22 2131  Weight: (!) 142.4 kg    Examination:  General: NAD HEENT:  Normocephalic atraumatic.    Lungs:  Clear to auscultate bilaterally without rhonchi, wheeze, or rales. Heart:  Regular rate and rhythm.  Without murmurs, rubs, or gallops. Abdomen:  Soft, mild diffuse tenderness, obese Extremities: trace LE edema. Skin:  Warm  Data Reviewed: I have personally reviewed following labs and imaging studies  CBC: Recent Labs  Lab 12/24/22 2155 12/25/22 0407 12/26/22 0546 12/27/22 0615  WBC 13.4* 13.1* 11.3* 10.5  HGB 13.4 12.4* 12.7* 12.0*  HCT 40.3 38.8* 40.7 38.0*  MCV 88.8 92.6 94.7 93.8  PLT 268 242 261  243   Basic Metabolic Panel: Recent Labs  Lab 12/24/22 2155 12/25/22 0407 12/26/22 0546 12/27/22 0615  NA 136 136 138 133*  K 3.9 3.8 4.0 3.9  CL 100 102 100 96*  CO2 23 24 25 28   GLUCOSE 109* 113* 104* 110*  BUN 11 14 20 19   CREATININE 0.91 0.97 1.01 1.03  CALCIUM 8.6* 8.5* 8.9 8.5*  MG  --  1.9  --   --   PHOS  --  3.9  --   --    GFR: Estimated Creatinine Clearance: 110.4 mL/min (by C-G formula based on SCr of 1.03 mg/dL). Liver Function Tests: Recent Labs  Lab 12/24/22 2155 12/25/22 0407 12/27/22 0615  AST  119* 90* 75*  ALT 164* 142* 95*  ALKPHOS 307* 280* 222*  BILITOT 1.2 1.2 1.0  PROT 7.9 7.3 7.2  ALBUMIN 3.2* 2.9* 2.6*   Recent Labs  Lab 12/24/22 2155  LIPASE 23   Coagulation Profile: Recent Labs  Lab 12/25/22 1012  INR 1.3*   CBG: Recent Labs  Lab 12/26/22 1109 12/26/22 1623 12/26/22 2102 12/27/22 0728 12/27/22 1104  GLUCAP 116* 105* 104* 95 102*   Sepsis Labs: Recent Labs  Lab 12/25/22 0407 12/25/22 0536  LATICACIDVEN 1.9 2.1*   Recent Results (from the past 240 hour(s))  Culture, blood (Routine X 2) w Reflex to ID Panel     Status: None (Preliminary result)   Collection Time: 12/25/22  4:07 AM   Specimen: BLOOD  Result Value Ref Range Status   Specimen Description   Final    BLOOD LEFT ANTECUBITAL Performed at [facility], [address], [place], [place] [address]    Special Requests   Final    Blood Culture adequate volume BOTTLES DRAWN AEROBIC AND ANAEROBIC Performed at [facility], [address] [place], [place] [address]    Culture   Final    NO GROWTH 2 DAYS Performed at [facility], [address] [place], [place] [address]    Report Status PENDING  Incomplete  Culture, blood (Routine X 2) w Reflex to ID Panel     Status: None (Preliminary result)   Collection Time: 12/25/22  4:07 AM   Specimen: BLOOD  Result Value Ref Range Status   Specimen Description   Final    BLOOD BLOOD LEFT HAND Performed at [facility], [address] [place], [place] [address]    Special Requests   Final    Blood Culture adequate volume BOTTLES DRAWN AEROBIC AND ANAEROBIC Performed at [facility], [address], [place], [place] [address]    Culture   Final    NO GROWTH 2 DAYS Performed at [facility], [address], [place], [place] [address]    Report Status PENDING  Incomplete     Radiology Studies: US BIOPSY (LIVER)  Result Date:  12/25/2022 INDICATION: History of prostate carcinoma and diffuse masses throughout the liver parenchyma consistent with metastatic disease potentially of prostate or other origin. The patient presents for liver biopsy. EXAM: ULTRASOUND GUIDED CORE BIOPSY OF LIVER MASS MEDICATIONS: None. ANESTHESIA/SEDATION: Moderate (conscious) sedation was employed during this procedure. A total of Versed 1.0 mg and Fentanyl 50 mcg was administered intravenously. Moderate Sedation Time: 10 minutes. The patient's level of consciousness and vital signs were monitored continuously by radiology nursing throughout the procedure under my direct supervision. PROCEDURE: The procedure, risks, benefits, and alternatives were explained to the patient. Questions  regarding the procedure were encouraged and answered. The patient understands and consents to the procedure. A time-out was performed prior to initiating the procedure. Ultrasound was performed of the liver to localize lesions. The abdominal wall was prepped with chlorhexidine in a sterile fashion, and a sterile drape was applied covering the operative field. A sterile gown and sterile gloves were used for the procedure. Local anesthesia was provided with 1% Lidocaine. Under ultrasound guidance, a 17 gauge trocar needle was advanced into the right lobe of the liver. After confirming needle tip position, 3 separate coaxial 18 gauge core biopsy samples were obtained at the level of a lesion. Core biopsy samples were submitted in formalin. A slurry of Gel-Foam pledgets in sterile saline was then slowly injected as the outer needle was retracted and removed. Additional ultrasound was performed. COMPLICATIONS: None immediate. FINDINGS: A multitude of mass lesions are seen throughout the liver parenchyma. The largest confluent region of tumor in the right lobe was targeted measuring roughly 8 cm in diameter as seen by CT. Solid tissue was obtained. IMPRESSION: Ultrasound-guided core biopsy  of tumor within the right lobe of the liver at the level of tumor mass measuring roughly 8 cm in diameter. Electronically Signed   By: Irish Lack M.D.   On: 12/25/2022 15:08    Scheduled Meds:  insulin aspart  0-15 Units Subcutaneous TID WC   insulin aspart  0-5 Units Subcutaneous QHS   montelukast  10 mg Oral QHS   Continuous Infusions:     LOS: 1 day    Silvano Bilis, MD [facility]  If 7PM-7AM, please contact night-coverage [email or web address]  12/27/2022, 1:58 PM

## 2022-12-28 DIAGNOSIS — C787 Secondary malignant neoplasm of liver and intrahepatic bile duct: Secondary | ICD-10-CM | POA: Diagnosis not present

## 2022-12-28 LAB — COMPREHENSIVE METABOLIC PANEL
ALT: 84 U/L — ABNORMAL HIGH (ref 0–44)
AST: 74 U/L — ABNORMAL HIGH (ref 15–41)
Albumin: 2.6 g/dL — ABNORMAL LOW (ref 3.5–5.0)
Alkaline Phosphatase: 229 U/L — ABNORMAL HIGH (ref 38–126)
Anion gap: 9 (ref 5–15)
BUN: 16 mg/dL (ref 8–23)
CO2: 27 mmol/L (ref 22–32)
Calcium: 8.7 mg/dL — ABNORMAL LOW (ref 8.9–10.3)
Chloride: 99 mmol/L (ref 98–111)
Creatinine, Ser: 0.9 mg/dL (ref 0.61–1.24)
GFR, Estimated: >60 mL/min (ref >60)
Glucose, Bld: 95 mg/dL (ref 70–99)
Potassium: 4 mmol/L (ref 3.5–5.1)
Sodium: 135 mmol/L (ref 135–145)
Total Bilirubin: 1.4 mg/dL — ABNORMAL HIGH (ref 0.3–1.2)
Total Protein: 7.1 g/dL (ref 6.5–8.1)

## 2022-12-28 LAB — CBC
HCT: 36.2 % — ABNORMAL LOW (ref 39.0–52.0)
Hemoglobin: 11.8 g/dL — ABNORMAL LOW (ref 13.0–17.0)
MCH: 29.7 pg (ref 26.0–34.0)
MCHC: 32.6 g/dL (ref 30.0–36.0)
MCV: 91.2 fL (ref 80.0–100.0)
Platelets: 246 10*3/uL (ref 150–400)
RBC: 3.97 MIL/uL — ABNORMAL LOW (ref 4.22–5.81)
RDW: 15.7 % — ABNORMAL HIGH (ref 11.5–15.5)
WBC: 9.6 10*3/uL (ref 4.0–10.5)
nRBC: 0 % (ref 0.0–0.2)

## 2022-12-28 LAB — URINALYSIS, W/ REFLEX TO CULTURE (INFECTION SUSPECTED)
Bilirubin Urine: NEGATIVE
Glucose, UA: NEGATIVE mg/dL
Ketones, ur: NEGATIVE mg/dL
Nitrite: NEGATIVE
Protein, ur: 30 mg/dL — AB
RBC / HPF: >50 RBC/hpf (ref 0–5)
Specific Gravity, Urine: 1.021 (ref 1.005–1.030)
WBC, UA: >50 WBC/hpf (ref 0–5)
pH: 6 (ref 5.0–8.0)

## 2022-12-28 LAB — GLUCOSE, CAPILLARY
Glucose-Capillary: 84 mg/dL (ref 70–99)
Glucose-Capillary: 88 mg/dL (ref 70–99)
Glucose-Capillary: 96 mg/dL (ref 70–99)
Glucose-Capillary: 102 mg/dL — ABNORMAL HIGH (ref 70–99)

## 2022-12-28 MED ORDER — SODIUM CHLORIDE 0.9 % IV SOLN
1.0000 g | INTRAVENOUS | Status: DC
  Administered 2022-12-28 – 2022-12-29 (×2): 1 g via INTRAVENOUS
  Filled 2022-12-28 (×2): qty 10

## 2022-12-28 MED ORDER — POLYETHYLENE GLYCOL 3350 17 G PO PACK
17.0000 g | PACK | Freq: Every day | ORAL | Status: DC
  Administered 2022-12-28 – 2022-12-30 (×3): 17 g via ORAL
  Filled 2022-12-28 (×2): qty 1

## 2022-12-28 MED ORDER — BISACODYL 10 MG RE SUPP: 10.0000 mg | Freq: Every day | RECTAL | Status: DC | PRN

## 2022-12-28 MED ORDER — BISACODYL 5 MG PO TBEC
10.0000 mg | DELAYED_RELEASE_TABLET | Freq: Every day | ORAL | Status: DC
Start: 2022-12-28 — End: 2022-12-30
  Filled 2022-12-28 (×2): qty 2

## 2022-12-28 MED ORDER — BISACODYL 5 MG PO TBEC
10.0000 mg | DELAYED_RELEASE_TABLET | Freq: Once | ORAL | Status: AC
  Administered 2022-12-28: 10 mg via ORAL
  Filled 2022-12-28: qty 2

## 2022-12-28 MED ORDER — PHENAZOPYRIDINE HCL 100 MG PO TABS
100.0000 mg | ORAL_TABLET | Freq: Three times a day (TID) | ORAL | Status: AC | PRN
  Administered 2022-12-28 (×2): 100 mg via ORAL
  Filled 2022-12-28 (×5): qty 1

## 2022-12-28 NOTE — Progress Notes (Signed)
Notified MD Lucianne Muss in regards to patient's dysuria and minimal bleeding from urethra. MD Lucianne Muss placed orders for pyridium and UA/UC order. Will complete. Discussed with patient plan. Will continue to monitor and assess.

## 2022-12-28 NOTE — Progress Notes (Signed)
MRI unsuccessful. Patient said he was too big and could not breathe laying flat even with O2 @ 4 L

## 2022-12-28 NOTE — Plan of Care (Signed)
  Problem: Education: Goal: Knowledge of General Education information will improve Description: Including pain rating scale, medication(s)/side effects and non-pharmacologic comfort measures Outcome: Progressing   Problem: Health Behavior/Discharge Planning: Goal: Ability to manage health-related needs will improve Outcome: Progressing   Problem: Clinical Measurements: Goal: Ability to maintain clinical measurements within normal limits will improve Outcome: Progressing Goal: Will remain free from infection Outcome: Progressing Goal: Diagnostic test results will improve Outcome: Progressing Goal: Respiratory complications will improve Outcome: Progressing Goal: Cardiovascular complication will be avoided Outcome: Progressing   Problem: Activity: Goal: Risk for activity intolerance will decrease Outcome: Progressing   Problem: Nutrition: Goal: Adequate nutrition will be maintained Outcome: Progressing   Problem: Coping: Goal: Level of anxiety will decrease Outcome: Progressing   Problem: Elimination: Goal: Will not experience complications related to bowel motility Outcome: Progressing   Problem: Safety: Goal: Ability to remain free from injury will improve Outcome: Progressing   Problem: Pain Management: Goal: General experience of comfort will improve Outcome: Progressing   Problem: Skin Integrity: Goal: Risk for impaired skin integrity will decrease Outcome: Progressing   Problem: Education: Goal: Ability to describe self-care measures that may prevent or decrease complications (Diabetes Survival Skills Education) will improve Outcome: Progressing   Problem: Coping: Goal: Ability to adjust to condition or change in health will improve Outcome: Progressing   Problem: Fluid Volume: Goal: Ability to maintain a balanced intake and output will improve Outcome: Progressing   Problem: Metabolic: Goal: Ability to maintain appropriate glucose levels will  improve Outcome: Progressing   Problem: Skin Integrity: Goal: Risk for impaired skin integrity will decrease Outcome: Progressing   Problem: Tissue Perfusion: Goal: Adequacy of tissue perfusion will improve Outcome: Progressing

## 2022-12-28 NOTE — Plan of Care (Signed)

## 2022-12-28 NOTE — Progress Notes (Signed)
Mobility Specialist - Progress Note   12/28/22 1137  Mobility  Activity Stood at bedside  Level of Assistance Modified independent, requires aide device or extra time  Assistive Device Front wheel walker  Activity Response Tolerated well  Mobility Referral Yes  $Mobility charge 1 Mobility  Mobility Specialist Start Time (ACUTE ONLY) 1105  Mobility Specialist Stop Time (ACUTE ONLY) 1128  Mobility Specialist Time Calculation (min) (ACUTE ONLY) 23 min   Pt received in recliner declining mobility but agreeable to do STS. Pt tolerated x2 STS. No complaint during session. Pt voiced wanting to attempt ambulation this afternoon. Pt to recliner after session with all needs met.   [facility]

## 2022-12-28 NOTE — Progress Notes (Signed)
PROGRESS NOTE    Peter Camacho  VFI:[id number] DOB: October 04, 1960 DOA: 12/24/2022 PCP: Sharlene Dory, DO   Brief Narrative:  Peter Camacho is a 62 y.o. male with medical history significant for hypertension, obesity, prostate cancer treated with prostate seed implants - been admitted to the hospital with intractable abdominal pain nausea and vomiting found to have large liver mass.  Assessment & Plan:   Principal Problem:   Metastatic cancer to liver [facility]) Active Problems:   Intractable nausea and vomiting   Liver mass   Obesity (BMI 30-39.9)  Intractable abdominal pain, nausea, vomiting Ruq pain likely 2/2 liver masses. Nothing else acute seen on ct a/p -made npo yesterday, reports no vomiting since -will increase diet to full liquids today - if unable to advance diet may need to involve GI for consideration of EGD -P.o. oxycodone prn -Continue supportive care, Zofran, Phenergan alternating  New liver mass Prior history of prostate cancer -Unclear etiology, liver biopsy performed 12/25/2022 -prostate cancer treated 8-9 years ago with [facility]Dr. Manning/urology Dr. Vernie Ammons). - Dr. Cherly Hensen of oncology consulted, follow formal consult note. He advises checking ca 19-9, cea, mri brain, and ct of the chest, which I've ordered Patient refused MRI brain due to unable to fit secondary to obesity.  Recommend open MRI as an outpatient. CT Chest: Small right effusion, RLL atelectasis.  Emphysema, pulmonary nodule 4 mm, possible metastasis. Follow biopsy report   UTI Reactive leukocytosis Lactic acidosis -Continue to follow, downtrending appropriately,  Continue dehydration and poor p.o. intake UA positive, started ceftriaxone 1 g IV daily Follow urine culture  Hematuria developed on 10/31 Discontinued Lovenox Bladder scan negative for retention Urology consult appreciated Monitor H&H   Hypertension -Currently well controlled off home medications (holding  hydrochlorothiazide)  Obesity, morbid Body mass index is 40.32 kg/m. Discussed diet, weight loss, activity regimen   DVT prophylaxis: lovenox   Code Status:   Code Status: Full Code Family Communication: Discussed with girlfriend over the phone  Status is: Inpatient  Dispo: The patient is from: Home              Anticipated d/c is to: Home              Anticipated d/c date is: 24 to 48 hours              Patient currently not medically stable for discharge given advancing diet  Consultants:  Oncology Urology  Procedures:  Liver biopsy 10/28  Antimicrobials:  10/31 ceftriaxone  Subjective: No significant events overnight, in the morning time patient was having dysuria and hematuria.  Denied any other complaints.   Objective: Vitals:   12/27/22 1254 12/27/22 2033 12/28/22 0554 12/28/22 1301  BP: 132/81 (!) 112/98 131/81 (!) 142/99  Pulse: (!) 115  (!) 106 (!) 112  Resp: 18 16 16 18   Temp: 97.8 F (36.6 C) 98 F (36.7 C) 98.4 F (36.9 C) 98 F (36.7 C)  TempSrc: Oral Oral Oral Oral  SpO2: 94% 97% 94% 96%  Weight:      Height:        Intake/Output Summary (Last 24 hours) at 12/28/2022 1451 Last data filed at 12/28/2022 1340 Gross per 24 hour  Intake 480 ml  Output 1700 ml  Net -1220 ml   Filed Weights   12/24/22 2131  Weight: (!) 142.4 kg    Examination:  General: NAD HEENT:  Normocephalic atraumatic.    Lungs:  Clear to auscultate bilaterally without rhonchi, wheeze, or rales.  Heart:  Regular rate and rhythm.  Without murmurs, rubs, or gallops. Abdomen:  Soft, mild diffuse tenderness, obese Extremities: trace LE edema. Skin:  Warm  Data Reviewed: I have personally reviewed following labs and imaging studies  CBC: Recent Labs  Lab 12/24/22 2155 12/25/22 0407 12/26/22 0546 12/27/22 0615 12/28/22 0533  WBC 13.4* 13.1* 11.3* 10.5 9.6  HGB 13.4 12.4* 12.7* 12.0* 11.8*  HCT 40.3 38.8* 40.7 38.0* 36.2*  MCV 88.8 92.6 94.7 93.8 91.2  PLT  268 242 261 243 246   Basic Metabolic Panel: Recent Labs  Lab 12/24/22 2155 12/25/22 0407 12/26/22 0546 12/27/22 0615 12/28/22 0533  NA 136 136 138 133* 135  K 3.9 3.8 4.0 3.9 4.0  CL 100 102 100 96* 99  CO2 23 24 25 28 27   GLUCOSE 109* 113* 104* 110* 95  BUN 11 14 20 19 16   CREATININE 0.91 0.97 1.01 1.03 0.90  CALCIUM 8.6* 8.5* 8.9 8.5* 8.7*  MG  --  1.9  --   --   --   PHOS  --  3.9  --   --   --    GFR: Estimated Creatinine Clearance: 126.3 mL/min (by C-G formula based on SCr of 0.9 mg/dL). Liver Function Tests: Recent Labs  Lab 12/24/22 2155 12/25/22 0407 12/27/22 0615 12/28/22 0533  AST 119* 90* 75* 74*  ALT 164* 142* 95* 84*  ALKPHOS 307* 280* 222* 229*  BILITOT 1.2 1.2 1.0 1.4*  PROT 7.9 7.3 7.2 7.1  ALBUMIN 3.2* 2.9* 2.6* 2.6*   Recent Labs  Lab 12/24/22 2155  LIPASE 23   Coagulation Profile: Recent Labs  Lab 12/25/22 1012  INR 1.3*   CBG: Recent Labs  Lab 12/27/22 1104 12/27/22 1612 12/27/22 2033 12/28/22 0727 12/28/22 1109  GLUCAP 102* 104* 89 84 88   Sepsis Labs: Recent Labs  Lab 12/25/22 0407 12/25/22 0536  LATICACIDVEN 1.9 2.1*   Recent Results (from the past 240 hour(s))  Culture, blood (Routine X 2) w Reflex to ID Panel     Status: None (Preliminary result)   Collection Time: 12/25/22  4:07 AM   Specimen: BLOOD  Result Value Ref Range Status   Specimen Description   Final    BLOOD LEFT ANTECUBITAL Performed at [facility], [address], [place], [place] [address]    Special Requests   Final    Blood Culture adequate volume BOTTLES DRAWN AEROBIC AND ANAEROBIC Performed at [facility], [address], [place], [place] [address]    Culture   Final    NO GROWTH 3 DAYS Performed at [facility], [address], [place], [place] [address]    Report Status PENDING  Incomplete  Culture, blood (Routine X 2) w Reflex to ID Panel     Status: None (Preliminary result)   Collection  Time: 12/25/22  4:07 AM   Specimen: BLOOD  Result Value Ref Range Status   Specimen Description   Final    BLOOD BLOOD LEFT HAND Performed at [facility], [address] [place], [place] [address]    Special Requests   Final    Blood Culture adequate volume BOTTLES DRAWN AEROBIC AND ANAEROBIC Performed at [facility], [address], [place], [place] [address]    Culture   Final    NO GROWTH 3 DAYS Performed at [facility], [address], [place], [place] [address]    Report Status PENDING  Incomplete  Radiology Studies: CT CHEST W CONTRAST  Result Date: 12/28/2022 CLINICAL DATA:  Hepatic masses assess for malignancy EXAM: CT CHEST WITH CONTRAST TECHNIQUE: Multidetector CT imaging of the chest was performed during intravenous contrast administration. RADIATION DOSE REDUCTION: This exam was performed according to the departmental dose-optimization program which includes automated exposure control, adjustment of the mA and/or kV according to patient size and/or use of iterative reconstruction technique. CONTRAST:  75mL OMNIPAQUE IOHEXOL 300 MG/ML  SOLN COMPARISON:  Chest x-ray 12/24/2022, CT 11/29/2016, 12/24/2022 FINDINGS: Cardiovascular: Nonaneurysmal aorta. Mild atherosclerosis. Normal cardiac size. No pericardial effusion. Mediastinum/Nodes: Midline trachea. No thyroid mass. No suspicious lymph nodes. Esophagus within normal limits. Lungs/Pleura: Small right-sided pleural effusion. Atelectasis at the left base. Favor passive atelectasis at the right lower lobe. Punctate 3 mm right apical pulmonary nodule, series 5, image 40. 4 mm left upper lobe pulmonary nodule series 5, image 38. These are not clearly identified on comparison CT from 2018. emphysema. Upper Abdomen: Multiple hepatic masses concerning for metastatic disease. No acute finding Musculoskeletal: No acute osseous abnormality. No suspicious bone lesion IMPRESSION: 1. Small  right-sided pleural effusion with passive atelectasis at the right lower lobe. 2. Emphysema. Small upper lobe pulmonary nodules measuring up to 4 mm, not clearly identified on comparison CT from 2018. These are nonspecific in appearance and could be infectious, inflammatory or given the liver findings, metastatic in origin. Attention on follow-up CT chest imaging. 3. Multiple hepatic masses concerning for metastatic disease as seen on the dedicated abdominopelvic CT. Aortic Atherosclerosis (ICD10-I70.0) and Emphysema (ICD10-J43.9). Electronically Signed   By: Jasmine Pang M.D.   On: 12/28/2022 01:03    Scheduled Meds:  bisacodyl  10 mg Oral QHS   insulin aspart  0-15 Units Subcutaneous TID WC   insulin aspart  0-5 Units Subcutaneous QHS   montelukast  10 mg Oral QHS   polyethylene glycol  17 g Oral Daily   Continuous Infusions:  cefTRIAXone (ROCEPHIN)  IV 1 g (12/28/22 1439)   Time spent 55 minutes    LOS: 2 days    Gillis Santa, MD [facility]  If 7PM-7AM, please contact night-coverage [email or web address]  12/28/2022, 2:51 PM

## 2022-12-28 NOTE — Progress Notes (Signed)
Notified Respiratory therapist, Misty Stanley, in regards to patient having expiratory wheezing and providing a prn albuterol trx. Patient is in no distress. Will continue to monitor and assess.

## 2022-12-28 NOTE — Progress Notes (Signed)
NP Elmon Kirschner with urology ordered to not insert foley cath ordered. Patient making urine.

## 2022-12-28 NOTE — Progress Notes (Signed)
Notified MD Lucianne Muss in regards to patient's UA resulting. MD Lucianne Muss placed order for cetriaxone to be initiated. Will complete and continue to monitor and assess.

## 2022-12-28 NOTE — Consult Note (Signed)
Urology Consult Note   Requesting Attending Physician:  Gillis Santa, MD Service Providing Consult: Urology  Consulting Attending: Dr. Cardell Peach   Reason for Consult:  hematuria  HPI: Peter Camacho is seen in consultation for reasons noted above at the request of Gillis Santa, MD. Patient is remotely known to our practice, having seen Dr. Margo Aye on 09/26/2017 to establish care.  He has not returned.  01/03/2013 he underwent biopsy returning pathology of Gleason 3+3=6 with 1 core of 3+4.  He had radioactive seeds placed on 03/20/2014.  He is now admitted to [facility] since 12/24/2022 with complaint of abdominal pain.  CT imaging ultimately revealing large liver mass.  Biopsy performed 12/25/2022.  There was not a clear sample but enough urine remaining in bedside urinal to see that patient was not experiencing hematuria.  He clarified that he had burning with urination and a few drops of blood passing at the end of urination.  He denies hesitancy, urgency, nocturia, hematuria or any other lower urinary tract symptoms prior to this.  ------------------  Assessment:  62 y.o. male with burning with urination and minor bleeding at end of urination.   Recommendations: # Burning with urination # Hematuria  No sign of gross hematuria on assessment.  Microscopic hematuria will not be able to be assessed on urinalysis since patient has been started on Pyridium and this will pigment contaminate the sample, possibly for nitrite positivity as well.  Discussed with nursing, string of bottles will be collected overnight  Symptomatically this favors a urinary tract infection.  He did get a dose of Zosyn given the day before yesterday so we may not be able to obtain an accurate sample.  Urinalysis collected 12/24/2022 relatively unremarkable.  It noted few bacteria, 21-50 RBCs, large hemoglobin, though was contaminated with 6-10 squamous epithelial cells.  Agree with continuing Pyridium for a  few days  Will consider CT hematuria if he begins to display blood in his urine.  No evidence of obstructive uropathy, bladder wall thickening, or stones on CT collected 10/27.  Reassess tomorrow morning.  Case and plan discussed with Dr. Cardell Peach  Past Medical History: Past Medical History:  Diagnosis Date   Allergy    Asthma    At risk for sleep apnea    STOP-BANG= 5    SENT TO PCP 03-17-2014   GI bleed    Hard of hearing    History of seizure    HX menigitis x2  in middle and high school - residual seizure's on medications--  last seizure age 66   Hypertension    Obesity    Personal history of meningitis    X2  MIDDLE AND HIGH SCHOOL--  residual seizure's on medication -- last seizure age 60   Prostate cancer (HCC) MONITORED BY DR OTTELIN   stage T1c, Gleason 3+4, PSA 12.79, vol 38.5cc    Past Surgical History:  Past Surgical History:  Procedure Laterality Date   ESOPHAGOGASTRODUODENOSCOPY (EGD) WITH PROPOFOL N/A 08/10/2019   Procedure: ESOPHAGOGASTRODUODENOSCOPY (EGD) WITH PROPOFOL;  Surgeon: Meryl Dare, MD;  Location: [facility];  Service: Endoscopy;  Laterality: N/A;   HEMOSTASIS CONTROL  08/10/2019   Procedure: HEMOSTASIS CONTROL;  Surgeon: Meryl Dare, MD;  Location: [facility];  Service: Endoscopy;;   HOT HEMOSTASIS N/A 08/10/2019   Procedure: HOT HEMOSTASIS (ARGON PLASMA COAGULATION/BICAP);  Surgeon: Meryl Dare, MD;  Location: [facility];  Service: Endoscopy;  Laterality: N/A;   PROSTATE BIOPSY  RADIOACTIVE SEED IMPLANT N/A 03/20/2014   Procedure: RADIOACTIVE SEED IMPLANT;  Surgeon: Garnett Farm, MD;  Location: [facility];  Service: Urology;  Laterality: N/A;   SCLEROTHERAPY  08/10/2019   Procedure: SCLEROTHERAPY;  Surgeon: Meryl Dare, MD;  Location: [facility];  Service: Endoscopy;;    Medication: Current Facility-Administered Medications  Medication Dose Route Frequency Provider Last Rate Last Admin   acetaminophen  (TYLENOL) tablet 650 mg  650 mg Oral Q6H PRN Kirby Crigler, Mir M, MD       Or   acetaminophen (TYLENOL) suppository 650 mg  650 mg Rectal Q6H PRN Kirby Crigler, Mir M, MD       albuterol (PROVENTIL) (2.5 MG/3ML) 0.083% nebulizer solution 2.5 mg  2.5 mg Nebulization Q2H PRN Kirby Crigler, Mir M, MD   2.5 mg at 12/28/22 0810   bisacodyl (DULCOLAX) EC tablet 10 mg  10 mg Oral Once Gillis Santa, MD       bisacodyl (DULCOLAX) EC tablet 10 mg  10 mg Oral QHS Gillis Santa, MD       bisacodyl (DULCOLAX) suppository 10 mg  10 mg Rectal Daily PRN Gillis Santa, MD       insulin aspart (novoLOG) injection 0-15 Units  0-15 Units Subcutaneous TID WC Kirby Crigler, Mir M, MD       insulin aspart (novoLOG) injection 0-5 Units  0-5 Units Subcutaneous QHS Kirby Crigler, Mir M, MD       montelukast (SINGULAIR) tablet 10 mg  10 mg Oral QHS Kirby Crigler, Mir M, MD   10 mg at 12/27/22 2114   ondansetron (ZOFRAN) tablet 4 mg  4 mg Oral Q6H PRN Kirby Crigler, Mir M, MD       Or   ondansetron [employer]) injection 4 mg  4 mg Intravenous Q6H PRN Kirby Crigler, Mir M, MD   4 mg at 12/27/22 8657   oxyCODONE (Oxy IR/ROXICODONE) immediate release tablet 10-15 mg  10-15 mg Oral Q4H PRN Kathrynn Running, MD   15 mg at 12/27/22 1955   phenazopyridine (PYRIDIUM) tablet 100 mg  100 mg Oral TID PRN Gillis Santa, MD   100 mg at 12/28/22 1011   polyethylene glycol (MIRALAX / GLYCOLAX) packet 17 g  17 g Oral Daily Gillis Santa, MD       promethazine (PHENERGAN) injection 12.5 mg  12.5 mg Intramuscular Q6H PRN Azucena Fallen, MD   12.5 mg at 12/26/22 1439   traZODone (DESYREL) tablet 25 mg  25 mg Oral QHS PRN Maryln Gottron, MD   25 mg at 12/25/22 2158    Allergies: Allergies  Allergen Reactions   Ace Inhibitors Anaphylaxis   Eicosapentaenoic Acid Shortness Of Breath, Itching, Rash and Other (See Comments)    VASCEPA   Fish-Derived Products Shortness Of Breath, Itching and Rash   Shellfish-Derived Products Shortness Of Breath, Itching  and Rash    Social History: Social History   Tobacco Use   Smoking status: Some Days    Current packs/day: 0.30    Average packs/day: 0.3 packs/day for 40.0 years (12.0 ttl pk-yrs)    Types: Cigarettes   Smokeless tobacco: Never   Tobacco comments:    quit 11/11/16  Vaping Use   Vaping status: Never Used  Substance Use Topics   Alcohol use: No   Drug use: No    Family History Family History  Problem Relation Age of Onset   CAD Mother 53   Diabetes Mother    CAD Father 50   Cancer Neg Hx  Review of Systems  Genitourinary:        Burning with urination.  A few drops of blood at urethral meatus at the end of urination.     Objective   Vital signs in last 24 hours: BP 131/81 (BP Location: Left Arm)   Pulse (!) 106   Temp 98.4 F (36.9 C) (Oral)   Resp 16   Ht 6\' 2"  (1.88 m)   Wt (!) 142.4 kg   SpO2 94%   BMI 40.32 kg/m   Physical Exam General: NAD, A&O, resting, appropriate HEENT: [place]/AT Pulmonary: Normal work of breathing Cardiovascular: no cyanosis Abdomen: Soft, NTTP, nondistended GU: WNL Neuro: Appropriate, no focal neurological deficits  Most Recent Labs: Lab Results  Component Value Date   WBC 9.6 12/28/2022   HGB 11.8 (L) 12/28/2022   HCT 36.2 (L) 12/28/2022   PLT 246 12/28/2022    Lab Results  Component Value Date   NA 135 12/28/2022   K 4.0 12/28/2022   CL 99 12/28/2022   CO2 27 12/28/2022   BUN 16 12/28/2022   CREATININE 0.90 12/28/2022   CALCIUM 8.7 (L) 12/28/2022   MG 1.9 12/25/2022   PHOS 3.9 12/25/2022    Lab Results  Component Value Date   INR 1.3 (H) 12/25/2022   APTT 29 03/13/2014     Urine Culture: @[id number] (laburin,org,[id number])@   IMAGING: CT CHEST W CONTRAST  Result Date: 12/28/2022 CLINICAL DATA:  Hepatic masses assess for malignancy EXAM: CT CHEST WITH CONTRAST TECHNIQUE: Multidetector CT imaging of the chest was performed during intravenous contrast administration. RADIATION DOSE REDUCTION: This  exam was performed according to the departmental dose-optimization program which includes automated exposure control, adjustment of the mA and/or kV according to patient size and/or use of iterative reconstruction technique. CONTRAST:  75mL OMNIPAQUE IOHEXOL 300 MG/ML  SOLN COMPARISON:  Chest x-ray 12/24/2022, CT 11/29/2016, 12/24/2022 FINDINGS: Cardiovascular: Nonaneurysmal aorta. Mild atherosclerosis. Normal cardiac size. No pericardial effusion. Mediastinum/Nodes: Midline trachea. No thyroid mass. No suspicious lymph nodes. Esophagus within normal limits. Lungs/Pleura: Small right-sided pleural effusion. Atelectasis at the left base. Favor passive atelectasis at the right lower lobe. Punctate 3 mm right apical pulmonary nodule, series 5, image 40. 4 mm left upper lobe pulmonary nodule series 5, image 38. These are not clearly identified on comparison CT from 2018. emphysema. Upper Abdomen: Multiple hepatic masses concerning for metastatic disease. No acute finding Musculoskeletal: No acute osseous abnormality. No suspicious bone lesion IMPRESSION: 1. Small right-sided pleural effusion with passive atelectasis at the right lower lobe. 2. Emphysema. Small upper lobe pulmonary nodules measuring up to 4 mm, not clearly identified on comparison CT from 2018. These are nonspecific in appearance and could be infectious, inflammatory or given the liver findings, metastatic in origin. Attention on follow-up CT chest imaging. 3. Multiple hepatic masses concerning for metastatic disease as seen on the dedicated abdominopelvic CT. Aortic Atherosclerosis (ICD10-I70.0) and Emphysema (ICD10-J43.9). Electronically Signed   By: Jasmine Pang M.D.   On: 12/28/2022 01:03    ------  Elmon Kirschner, NP Pager: [phone]   Please contact the urology consult pager with any further questions/concerns.

## 2022-12-28 NOTE — Consult Note (Signed)
[facility] ADMISSION NOTE  Patient Care Team: Sharlene Dory, DO as PCP - General (Family Medicine) Ihor Gully, MD (Inactive) as Consulting Physician (Urology)   ASSESSMENT & PLAN:  Diffuse liver masses Lung nodules Liver dysfunction Normocytic anemia   62 y.o. male with history of prostate cancer, hypertension, obesity presented with abdominal pain found to have diffuse liver masses.  Biopsy has been performed and results are pending.  1.9 cm precaval lymph node reported.  There is no bone lesion.  CT chest was requested and showed emphysema with two small upper pulmonary nodules reported.  He has history of prostate cancer, which was diagnosed in 2015 with T1c adenocarcinoma of the prostate with a Gleason's score of 3+4 and a PSA of 12.79, favorable intermediate risk treatment with brachytherapy in early 2016. PSA now is 0.06.  I discussed the available findings with patient today.  I also spoke to patient's friend over the phone afterwards at his request.  We discussed the need for pathology confirmation on the diagnosis.  If this turns out to be metastatic cancer, this is unfortunately not curable.  Depending on what kind of cancer, the specific of the type treatment can then be discussed.  He is understandably disappointed on hearing this results.  In the meantime, recommend MRI of the brain to rule out brain metastases.  Follow-up MRI of the brain Follow-up final pathology results CEA, CA 19-9, check AFP and LDH.  All questions were answered. The patient knows to call the clinic with any problems, questions or concerns.  Will follow up again tomorrow for pathology.   Melven Sartorius, MD 12/28/2022 8:10 AM   CHIEF COMPLAINTS/PURPOSE OF ADMISSION Abdominal pain  HISTORY OF PRESENTING ILLNESS:  Peter Camacho 62 y.o. male is admitted for abdominal pain.  Report have been abdominal pain in the center of the abdomen over the last week with minimal  nausea, and no fever, cough or vomiting.  Medical history including hypertension, obesity, prostate cancer treated with brachytherapy.  Patient reported about 2 weeks history of decreased appetite, nausea, and abdominal pain that is unremitting.  This resulted in hospitalization.  He denies any other history of other type of malignancy.  Report he still working full-time.  He has lost some weight as a result of decreased appetite.  He denies any other systemic symptoms like chest pain, shortness of breath, coughing. He reports difficulty with urination and resolved.  1 episode of hematuria.  1 episode of diarrhea last week that resolved.  Currently he has constipation and has taken stool softener.  Reported headache a few days ago but none currently.  He denies any focal weakness.  Summary of oncologic history as follows: Oncology History   No history exists.    MEDICAL HISTORY:  Past Medical History:  Diagnosis Date   Allergy    Asthma    At risk for sleep apnea    STOP-BANG= 5    SENT TO PCP 03-17-2014   GI bleed    Hard of hearing    History of seizure    HX menigitis x2  in middle and high school - residual seizure's on medications--  last seizure age 89   Hypertension    Obesity    Personal history of meningitis    X2  MIDDLE AND HIGH SCHOOL--  residual seizure's on medication -- last seizure age 26   Prostate cancer (HCC) MONITORED BY DR OTTELIN   stage T1c, Gleason 3+4, PSA 12.79, vol  38.5cc    SURGICAL HISTORY: Past Surgical History:  Procedure Laterality Date   ESOPHAGOGASTRODUODENOSCOPY (EGD) WITH PROPOFOL N/A 08/10/2019   Procedure: ESOPHAGOGASTRODUODENOSCOPY (EGD) WITH PROPOFOL;  Surgeon: Meryl Dare, MD;  Location: [facility];  Service: Endoscopy;  Laterality: N/A;   HEMOSTASIS CONTROL  08/10/2019   Procedure: HEMOSTASIS CONTROL;  Surgeon: Meryl Dare, MD;  Location: [facility];  Service: Endoscopy;;   HOT HEMOSTASIS N/A 08/10/2019   Procedure: HOT HEMOSTASIS  (ARGON PLASMA COAGULATION/BICAP);  Surgeon: Meryl Dare, MD;  Location: [facility];  Service: Endoscopy;  Laterality: N/A;   PROSTATE BIOPSY     RADIOACTIVE SEED IMPLANT N/A 03/20/2014   Procedure: RADIOACTIVE SEED IMPLANT;  Surgeon: Garnett Farm, MD;  Location: [facility];  Service: Urology;  Laterality: N/A;   SCLEROTHERAPY  08/10/2019   Procedure: SCLEROTHERAPY;  Surgeon: Meryl Dare, MD;  Location: [facility];  Service: Endoscopy;;    SOCIAL HISTORY: Social History   Socioeconomic History   Marital status: Single    Spouse name: Not on file   Number of children: Not on file   Years of education: Not on file   Highest education level: Not on file  Occupational History   Occupation: metal heat treating  Tobacco Use   Smoking status: Some Days    Current packs/day: 0.30    Average packs/day: 0.3 packs/day for 40.0 years (12.0 ttl pk-yrs)    Types: Cigarettes   Smokeless tobacco: Never   Tobacco comments:    quit 11/11/16  Vaping Use   Vaping status: Never Used  Substance and Sexual Activity   Alcohol use: No   Drug use: No   Sexual activity: Not Currently    Birth control/protection: None  Other Topics Concern   Not on file  Social History Narrative   Not on file   Social Determinants of Health   Financial Resource Strain: Not on file  Food Insecurity: No Food Insecurity (12/25/2022)   Hunger Vital Sign    Worried About Running Out of Food in the Last Year: Never true    Ran Out of Food in the Last Year: Never true  Transportation Needs: No Transportation Needs (12/25/2022)   PRAPARE - Administrator, Civil Service (Medical): No    Lack of Transportation (Non-Medical): No  Physical Activity: Not on file  Stress: Not on file  Social Connections: Not on file  Intimate Partner Violence: Not At Risk (12/25/2022)   Humiliation, Afraid, Rape, and Kick questionnaire    Fear of Current or Ex-Partner: No    Emotionally Abused: No     Physically Abused: No    Sexually Abused: No    FAMILY HISTORY: Family History  Problem Relation Age of Onset   CAD Mother 50   Diabetes Mother    CAD Father 6   Cancer Neg Hx     ALLERGIES:  is allergic to ace inhibitors, eicosapentaenoic acid, fish-derived products, and shellfish-derived products.  MEDICATIONS:  Current Facility-Administered Medications  Medication Dose Route Frequency Provider Last Rate Last Admin   acetaminophen (TYLENOL) tablet 650 mg  650 mg Oral Q6H PRN Kirby Crigler, Mir M, MD       Or   acetaminophen (TYLENOL) suppository 650 mg  650 mg Rectal Q6H PRN Kirby Crigler, Mir M, MD       albuterol (PROVENTIL) (2.5 MG/3ML) 0.083% nebulizer solution 2.5 mg  2.5 mg Nebulization Q2H PRN Kirby Crigler, Mir M, MD   2.5 mg at 12/25/22 2021  enoxaparin (LOVENOX) injection 40 mg  40 mg Subcutaneous Q24H Wouk, Wilfred Curtis, MD   40 mg at 12/27/22 1420   insulin aspart (novoLOG) injection 0-15 Units  0-15 Units Subcutaneous TID WC Kirby Crigler, Mir M, MD       insulin aspart (novoLOG) injection 0-5 Units  0-5 Units Subcutaneous QHS Kirby Crigler, Mir M, MD       montelukast (SINGULAIR) tablet 10 mg  10 mg Oral QHS Kirby Crigler, Mir M, MD   10 mg at 12/27/22 2114   ondansetron [facility]) tablet 4 mg  4 mg Oral Q6H PRN Maryln Gottron, MD       Or   ondansetron [facility]) injection 4 mg  4 mg Intravenous Q6H PRN Kirby Crigler, Mir M, MD   4 mg at 12/27/22 4098   oxyCODONE (Oxy IR/ROXICODONE) immediate release tablet 10-15 mg  10-15 mg Oral Q4H PRN Kathrynn Running, MD   15 mg at 12/27/22 1955   promethazine (PHENERGAN) injection 12.5 mg  12.5 mg Intramuscular Q6H PRN Azucena Fallen, MD   12.5 mg at 12/26/22 1439   traZODone (DESYREL) tablet 25 mg  25 mg Oral QHS PRN Maryln Gottron, MD   25 mg at 12/25/22 2158    REVIEW OF SYSTEMS:   Constitutional: positive weight loss and decreased appetite Eyes: Denies visual change Respiratory: Denies cough, positive for shortness of  breath Cardiovascular: Denies chest discomfort or chest pain Gastrointestinal:  as above Lymphatics: Denies new lymphadenopathy or mass Neurological: as above All other systems were reviewed with the patient and are negative.  PHYSICAL EXAMINATION: ECOG PERFORMANCE STATUS: 2 - Symptomatic, <50% confined to bed  Vitals:   12/27/22 2033 12/28/22 0554  BP: (!) 112/98 131/81  Pulse:  (!) 106  Resp: 16 16  Temp: 98 F (36.7 C) 98.4 F (36.9 C)  SpO2: 97% 94%   Filed Weights   12/24/22 2131  Weight: (!) 314 lb (142.4 kg)    GENERAL:alert, no distress and comfortable SKIN: skin color normal. No jaundice EYES: normal, conjunctiva normal, sclera clear OROPHARYNX: no exudate, moist NECK: supple. No mass LYMPH:  no palpable cervical, axillary or inguinal lymphadenopathy LUNGS: clear to auscultation and normal breathing effort.  No wheeze or rales HEART: regular rate & rhythm and no murmurs ABDOMEN:abdomen soft, non-tender and normal bowel sounds Musculoskeletal:  no lower extremity edema NEURO: alert & oriented x 3 with fluent speech; no focal motor/sensory deficits Strength and sensation equal bilaterally  LABORATORY DATA:  I have reviewed the data as listed Lab Results  Component Value Date   WBC 9.6 12/28/2022   HGB 11.8 (L) 12/28/2022   HCT 36.2 (L) 12/28/2022   MCV 91.2 12/28/2022   PLT 246 12/28/2022   Recent Labs    12/25/22 0407 12/26/22 0546 12/27/22 0615 12/28/22 0533  NA 136 138 133* 135  K 3.8 4.0 3.9 4.0  CL 102 100 96* 99  CO2 24 25 28 27   GLUCOSE 113* 104* 110* 95  BUN 14 20 19 16   CREATININE 0.97 1.01 1.03 0.90  CALCIUM 8.5* 8.9 8.5* 8.7*  GFRNONAA >60 >60 >60 >60  PROT 7.3  --  7.2 7.1  ALBUMIN 2.9*  --  2.6* 2.6*  AST 90*  --  75* 74*  ALT 142*  --  95* 84*  ALKPHOS 280*  --  222* 229*  BILITOT 1.2  --  1.0 1.4*  BILIDIR 0.5*  --   --   --   IBILI 0.7  --   --   --  RADIOGRAPHIC STUDIES: I have personally reviewed the radiological  images as listed and agreed with the findings in the report. CT CHEST W CONTRAST  Result Date: 12/28/2022 CLINICAL DATA:  Hepatic masses assess for malignancy EXAM: CT CHEST WITH CONTRAST TECHNIQUE: Multidetector CT imaging of the chest was performed during intravenous contrast administration. RADIATION DOSE REDUCTION: This exam was performed according to the departmental dose-optimization program which includes automated exposure control, adjustment of the mA and/or kV according to patient size and/or use of iterative reconstruction technique. CONTRAST:  75mL OMNIPAQUE IOHEXOL 300 MG/ML  SOLN COMPARISON:  Chest x-ray 12/24/2022, CT 11/29/2016, 12/24/2022 FINDINGS: Cardiovascular: Nonaneurysmal aorta. Mild atherosclerosis. Normal cardiac size. No pericardial effusion. Mediastinum/Nodes: Midline trachea. No thyroid mass. No suspicious lymph nodes. Esophagus within normal limits. Lungs/Pleura: Small right-sided pleural effusion. Atelectasis at the left base. Favor passive atelectasis at the right lower lobe. Punctate 3 mm right apical pulmonary nodule, series 5, image 40. 4 mm left upper lobe pulmonary nodule series 5, image 38. These are not clearly identified on comparison CT from 2018. emphysema. Upper Abdomen: Multiple hepatic masses concerning for metastatic disease. No acute finding Musculoskeletal: No acute osseous abnormality. No suspicious bone lesion IMPRESSION: 1. Small right-sided pleural effusion with passive atelectasis at the right lower lobe. 2. Emphysema. Small upper lobe pulmonary nodules measuring up to 4 mm, not clearly identified on comparison CT from 2018. These are nonspecific in appearance and could be infectious, inflammatory or given the liver findings, metastatic in origin. Attention on follow-up CT chest imaging. 3. Multiple hepatic masses concerning for metastatic disease as seen on the dedicated abdominopelvic CT. Aortic Atherosclerosis (ICD10-I70.0) and Emphysema (ICD10-J43.9).  Electronically Signed   By: Jasmine Pang M.D.   On: 12/28/2022 01:03   US BIOPSY (LIVER)  Result Date: 12/25/2022 INDICATION: History of prostate carcinoma and diffuse masses throughout the liver parenchyma consistent with metastatic disease potentially of prostate or other origin. The patient presents for liver biopsy. EXAM: ULTRASOUND GUIDED CORE BIOPSY OF LIVER MASS MEDICATIONS: None. ANESTHESIA/SEDATION: Moderate (conscious) sedation was employed during this procedure. A total of Versed 1.0 mg and Fentanyl 50 mcg was administered intravenously. Moderate Sedation Time: 10 minutes. The patient's level of consciousness and vital signs were monitored continuously by radiology nursing throughout the procedure under my direct supervision. PROCEDURE: The procedure, risks, benefits, and alternatives were explained to the patient. Questions regarding the procedure were encouraged and answered. The patient understands and consents to the procedure. A time-out was performed prior to initiating the procedure. Ultrasound was performed of the liver to localize lesions. The abdominal wall was prepped with chlorhexidine in a sterile fashion, and a sterile drape was applied covering the operative field. A sterile gown and sterile gloves were used for the procedure. Local anesthesia was provided with 1% Lidocaine. Under ultrasound guidance, a 17 gauge trocar needle was advanced into the right lobe of the liver. After confirming needle tip position, 3 separate coaxial 18 gauge core biopsy samples were obtained at the level of a lesion. Core biopsy samples were submitted in formalin. A slurry of Gel-Foam pledgets in sterile saline was then slowly injected as the outer needle was retracted and removed. Additional ultrasound was performed. COMPLICATIONS: None immediate. FINDINGS: A multitude of mass lesions are seen throughout the liver parenchyma. The largest confluent region of tumor in the right lobe was targeted measuring  roughly 8 cm in diameter as seen by CT. Solid tissue was obtained. IMPRESSION: Ultrasound-guided core biopsy of tumor within the right lobe of the  liver at the level of tumor mass measuring roughly 8 cm in diameter. Electronically Signed   By: Irish Lack M.D.   On: 12/25/2022 15:08   DG Chest 2 View  Result Date: 12/24/2022 CLINICAL DATA:  Severe right-sided abdominal pain and tachycardia EXAM: CHEST - 2 VIEW COMPARISON:  09/10/2022 FINDINGS: Stable cardiomediastinal silhouette. Aortic atherosclerotic calcification. Left basilar atelectasis. Otherwise no focal consolidation, pleural effusion, or pneumothorax. No displaced rib fractures. IMPRESSION: No acute cardiopulmonary disease. Electronically Signed   By: Minerva Fester M.D.   On: 12/24/2022 23:50   CT ABDOMEN PELVIS W CONTRAST  Result Date: 12/24/2022 CLINICAL DATA:  Right-sided abdominal pain EXAM: CT ABDOMEN AND PELVIS WITH CONTRAST TECHNIQUE: Multidetector CT imaging of the abdomen and pelvis was performed using the standard protocol following bolus administration of intravenous contrast. RADIATION DOSE REDUCTION: This exam was performed according to the departmental dose-optimization program which includes automated exposure control, adjustment of the mA and/or kV according to patient size and/or use of iterative reconstruction technique. CONTRAST:  OMNIPAQUE IOHEXOL 300 MG/ML  SOLN COMPARISON:  11/21/2013 CT pelvis FINDINGS: Lower chest: No acute abnormality. Hepatobiliary: Numerous hypoattenuating lesions throughout the liver measuring up to 8.0 cm in the right hepatic lobe. Unremarkable gallbladder and biliary tree. Pancreas: Unremarkable. Spleen: Unremarkable. Adrenals/Urinary Tract: Normal adrenal glands. Punctate nonobstructing bilateral nephrolithiasis. No obstructing urinary calculi or hydronephrosis. Unremarkable bladder. Stomach/Bowel: Normal caliber large and small bowel. Colonic diverticulosis greatest about the transverse  colon. Possible mild wall thickening versus underdistention about the transverse colon. Normal appendix. Stomach is within normal limits. Vascular/Lymphatic: No acute vascular abnormality. 1.9 cm precaval lymph node (series 301/image 42). Reproductive: Metallic seeds in the prostate. Other: No free intraperitoneal fluid or air. Musculoskeletal: No acute fracture or destructive osseous lesion. IMPRESSION: 1. Numerous hypoattenuating lesions throughout the liver measuring up to 8.0 cm in the right hepatic lobe, concerning for metastases of unknown primary. 2. 1.9 cm precaval lymph node, suspicious for metastatic disease. 3. Punctate nonobstructing bilateral nephrolithiasis. 4. Colonic diverticulosis greatest about the transverse colon where there is mild wall thickening. This could be due to mild diverticulitis or underdistention. Electronically Signed   By: Minerva Fester M.D.   On: 12/24/2022 23:49

## 2022-12-29 DIAGNOSIS — C787 Secondary malignant neoplasm of liver and intrahepatic bile duct: Secondary | ICD-10-CM | POA: Diagnosis not present

## 2022-12-29 DIAGNOSIS — K7689 Other specified diseases of liver: Secondary | ICD-10-CM | POA: Diagnosis not present

## 2022-12-29 LAB — COMPREHENSIVE METABOLIC PANEL
ALT: 81 U/L — ABNORMAL HIGH (ref 0–44)
AST: 82 U/L — ABNORMAL HIGH (ref 15–41)
Albumin: 2.5 g/dL — ABNORMAL LOW (ref 3.5–5.0)
Alkaline Phosphatase: 268 U/L — ABNORMAL HIGH (ref 38–126)
Anion gap: 11 (ref 5–15)
BUN: 19 mg/dL (ref 8–23)
CO2: 26 mmol/L (ref 22–32)
Calcium: 8.6 mg/dL — ABNORMAL LOW (ref 8.9–10.3)
Chloride: 98 mmol/L (ref 98–111)
Creatinine, Ser: 0.78 mg/dL (ref 0.61–1.24)
GFR, Estimated: >60 mL/min (ref >60)
Glucose, Bld: 94 mg/dL (ref 70–99)
Potassium: 3.9 mmol/L (ref 3.5–5.1)
Sodium: 135 mmol/L (ref 135–145)
Total Bilirubin: 1.3 mg/dL — ABNORMAL HIGH (ref 0.3–1.2)
Total Protein: 6.9 g/dL (ref 6.5–8.1)

## 2022-12-29 LAB — CBC
HCT: 35.3 % — ABNORMAL LOW (ref 39.0–52.0)
Hemoglobin: 11.5 g/dL — ABNORMAL LOW (ref 13.0–17.0)
MCH: 29.9 pg (ref 26.0–34.0)
MCHC: 32.6 g/dL (ref 30.0–36.0)
MCV: 91.7 fL (ref 80.0–100.0)
Platelets: 249 10*3/uL (ref 150–400)
RBC: 3.85 MIL/uL — ABNORMAL LOW (ref 4.22–5.81)
RDW: 15.8 % — ABNORMAL HIGH (ref 11.5–15.5)
WBC: 9.2 10*3/uL (ref 4.0–10.5)
nRBC: 0 % (ref 0.0–0.2)

## 2022-12-29 LAB — GLUCOSE, CAPILLARY
Glucose-Capillary: 89 mg/dL (ref 70–99)
Glucose-Capillary: 108 mg/dL — ABNORMAL HIGH (ref 70–99)
Glucose-Capillary: 101 mg/dL — ABNORMAL HIGH (ref 70–99)
Glucose-Capillary: 95 mg/dL (ref 70–99)

## 2022-12-29 LAB — URINE CULTURE: Culture: NO GROWTH

## 2022-12-29 LAB — SURGICAL PATHOLOGY

## 2022-12-29 LAB — CANCER ANTIGEN 19-9: CA 19-9: 122 U/mL — ABNORMAL HIGH (ref 0–35)

## 2022-12-29 LAB — LACTATE DEHYDROGENASE: LDH: 1525 U/L — ABNORMAL HIGH (ref 98–192)

## 2022-12-29 LAB — CEA: CEA: 1.7 ng/mL (ref 0.0–4.7)

## 2022-12-29 MED ORDER — HYDROXYZINE HCL 25 MG PO TABS
25.0000 mg | ORAL_TABLET | Freq: Three times a day (TID) | ORAL | Status: DC | PRN
  Filled 2022-12-29: qty 1

## 2022-12-29 MED ORDER — ENOXAPARIN SODIUM 40 MG/0.4ML IJ SOSY: 40.0000 mg | PREFILLED_SYRINGE | Freq: Every evening | INTRAMUSCULAR | Status: DC

## 2022-12-29 NOTE — Progress Notes (Signed)
PROGRESS NOTE    VINICIUS BROCKMAN  ZOX:[id number] DOB: 1960/09/22 DOA: 12/24/2022 PCP: Sharlene Dory, DO   Brief Narrative:  DELPHIN FUNES is a 62 y.o. male with medical history significant for hypertension, obesity, prostate cancer treated with prostate seed implants - been admitted to the hospital with intractable abdominal pain nausea and vomiting found to have large liver mass.  Assessment & Plan:   Principal Problem:   Metastatic cancer to liver [facility]) Active Problems:   Intractable nausea and vomiting   Liver mass   Obesity (BMI 30-39.9)   Liver dysfunction  Intractable abdominal pain, nausea, vomiting Ruq pain likely 2/2 liver masses. Nothing else acute seen on ct a/p 11/1 FLD, advance to diabetic diet -P.o. oxycodone prn -Continue supportive care, Zofran, Phenergan alternating  Adenocarcinoma with liver metastasis Patient presented with new liver mass Prior history of prostate cancer S/p liver biopsy performed 12/25/2022, found to have adenocarcinoma with focal necrosis -prostate cancer treated 8-9 years ago with [facility]Dr. Manning/urology Dr. Vernie Ammons). Patient refused MRI brain due to unable to fit secondary to obesity.  Recommend open MRI as an outpatient. CT Chest: Small right effusion, RLL atelectasis.  Emphysema, pulmonary nodule 4 mm, possible metastasis. Dr. Cherly Hensen of oncology consulted, recommended further workup and biopsy which was done and reported adenocarcinoma.  Dr. Cherly Hensen recommended palliative care and hospice, patient does not want as it is not a curable cancer.   UTI Reactive leukocytosis Lactic acidosis -Continue to follow, downtrending appropriately,  Continue dehydration and poor p.o. intake UA positive, started ceftriaxone 1 g IV daily Follow urine culture  Hematuria developed on 10/31, Resolved on 11/1 Discontinued Lovenox Bladder scan negative for retention Urology consult appreciated Monitor H&H Resume Lovenox on  11/2  Hypertension -Currently well controlled off home medications (holding hydrochlorothiazide)  Obesity, morbid Body mass index is 40.32 kg/m. Discussed diet, weight loss, activity regimen   DVT prophylaxis: lovenox   Code Status:   Code Status: Full Code Family Communication: Discussed with girlfriend over the phone  Status is: Inpatient  Dispo: The patient is from: Home              Anticipated d/c is to: Home              Anticipated d/c date is: 24 to 48 hours              Patient currently not medically stable for discharge given advancing diet  Consultants:  Oncology Urology  Procedures:  Liver biopsy 10/28  Antimicrobials:  10/31 ceftriaxone  Subjective: No significant events overnight, pain is well-controlled, patient did move bowels today, hematuria resolving.  Denied any active issues at this time.    Objective: Vitals:   12/28/22 1301 12/28/22 2248 12/29/22 0426 12/29/22 1349  BP: (!) 142/99 124/80 122/89 120/73  Pulse: (!) 112 (!) 110 (!) 110 79  Resp: 18 16 16 14   Temp: 98 F (36.7 C) 98.2 F (36.8 C) (!) 97.3 F (36.3 C) 98.8 F (37.1 C)  TempSrc: Oral Oral Oral Oral  SpO2: 96% 100% 94% 97%  Weight:      Height:        Intake/Output Summary (Last 24 hours) at 12/29/2022 1554 Last data filed at 12/29/2022 1300 Gross per 24 hour  Intake 1374 ml  Output 400 ml  Net 974 ml   Filed Weights   12/24/22 2131  Weight: (!) 142.4 kg    Examination:  General: NAD HEENT:  Normocephalic atraumatic.    Lungs:  Clear to auscultate bilaterally without rhonchi, wheeze, or rales. Heart:  Regular rate and rhythm.  Without murmurs, rubs, or gallops. Abdomen:  Soft, mild diffuse tenderness, obese Extremities: trace LE edema. Skin:  Warm  Data Reviewed: I have personally reviewed following labs and imaging studies  CBC: Recent Labs  Lab 12/25/22 0407 12/26/22 0546 12/27/22 0615 12/28/22 0533 12/29/22 0601  WBC 13.1* 11.3* 10.5 9.6 9.2   HGB 12.4* 12.7* 12.0* 11.8* 11.5*  HCT 38.8* 40.7 38.0* 36.2* 35.3*  MCV 92.6 94.7 93.8 91.2 91.7  PLT 242 261 243 246 249   Basic Metabolic Panel: Recent Labs  Lab 12/25/22 0407 12/26/22 0546 12/27/22 0615 12/28/22 0533 12/29/22 0601  NA 136 138 133* 135 135  K 3.8 4.0 3.9 4.0 3.9  CL 102 100 96* 99 98  CO2 24 25 28 27 26   GLUCOSE 113* 104* 110* 95 94  BUN 14 20 19 16 19   CREATININE 0.97 1.01 1.03 0.90 0.78  CALCIUM 8.5* 8.9 8.5* 8.7* 8.6*  MG 1.9  --   --   --   --   PHOS 3.9  --   --   --   --    GFR: Estimated Creatinine Clearance: 142.1 mL/min (by C-G formula based on SCr of 0.78 mg/dL). Liver Function Tests: Recent Labs  Lab 12/24/22 2155 12/25/22 0407 12/27/22 0615 12/28/22 0533 12/29/22 0601  AST 119* 90* 75* 74* 82*  ALT 164* 142* 95* 84* 81*  ALKPHOS 307* 280* 222* 229* 268*  BILITOT 1.2 1.2 1.0 1.4* 1.3*  PROT 7.9 7.3 7.2 7.1 6.9  ALBUMIN 3.2* 2.9* 2.6* 2.6* 2.5*   Recent Labs  Lab 12/24/22 2155  LIPASE 23   Coagulation Profile: Recent Labs  Lab 12/25/22 1012  INR 1.3*   CBG: Recent Labs  Lab 12/28/22 1109 12/28/22 1644 12/28/22 2019 12/29/22 0720 12/29/22 1131  GLUCAP 88 96 102* 89 108*   Sepsis Labs: Recent Labs  Lab 12/25/22 0407 12/25/22 0536  LATICACIDVEN 1.9 2.1*   Recent Results (from the past 240 hour(s))  Culture, blood (Routine X 2) w Reflex to ID Panel     Status: None (Preliminary result)   Collection Time: 12/25/22  4:07 AM   Specimen: BLOOD  Result Value Ref Range Status   Specimen Description   Final    BLOOD LEFT ANTECUBITAL Performed at [facility], [address], [place], [place] [address]    Special Requests   Final    Blood Culture adequate volume BOTTLES DRAWN AEROBIC AND ANAEROBIC Performed at [facility], [address] [place], [place] [address]    Culture   Final    NO GROWTH 4 DAYS Performed at [facility], [address], [place], [place]  [address]    Report Status PENDING  Incomplete  Culture, blood (Routine X 2) w Reflex to ID Panel     Status: None (Preliminary result)   Collection Time: 12/25/22  4:07 AM   Specimen: BLOOD  Result Value Ref Range Status   Specimen Description   Final    BLOOD BLOOD LEFT HAND Performed at [facility], [address], [place], [place] [address]    Special Requests   Final    Blood Culture adequate volume BOTTLES DRAWN AEROBIC AND ANAEROBIC Performed at [facility], [address], [place], [place] [address]    Culture   Final    NO GROWTH 4 DAYS Performed at [facility], [address], [place], [place] [address]    Report Status PENDING  Incomplete  Urine Culture     Status: None   Collection Time: 12/28/22 11:10 AM   Specimen: Urine, Random  Result Value Ref Range Status   Specimen Description   Final    URINE, RANDOM Performed at [facility], [address] [place], [place] [address]    Special Requests   Final    NONE Reflexed from [phone] Performed at [facility], [address], [place], [place] [address]    Culture   Final    NO GROWTH Performed at [facility], [address], [place], [place] [address]    Report Status 12/29/2022 FINAL  Final     Radiology Studies: CT CHEST W CONTRAST  Result Date: 12/28/2022 CLINICAL DATA:  Hepatic masses assess for malignancy EXAM: CT CHEST WITH CONTRAST TECHNIQUE: Multidetector CT imaging of the chest was performed during intravenous contrast administration. RADIATION DOSE REDUCTION: This exam was performed according to the departmental dose-optimization program which includes automated exposure control, adjustment of the mA and/or kV according to patient size and/or use of iterative reconstruction technique. CONTRAST:  75mL OMNIPAQUE IOHEXOL 300 MG/ML  SOLN COMPARISON:  Chest x-ray 12/24/2022, CT 11/29/2016, 12/24/2022 FINDINGS:  Cardiovascular: Nonaneurysmal aorta. Mild atherosclerosis. Normal cardiac size. No pericardial effusion. Mediastinum/Nodes: Midline trachea. No thyroid mass. No suspicious lymph nodes. Esophagus within normal limits. Lungs/Pleura: Small right-sided pleural effusion. Atelectasis at the left base. Favor passive atelectasis at the right lower lobe. Punctate 3 mm right apical pulmonary nodule, series 5, image 40. 4 mm left upper lobe pulmonary nodule series 5, image 38. These are not clearly identified on comparison CT from 2018. emphysema. Upper Abdomen: Multiple hepatic masses concerning for metastatic disease. No acute finding Musculoskeletal: No acute osseous abnormality. No suspicious bone lesion IMPRESSION: 1. Small right-sided pleural effusion with passive atelectasis at the right lower lobe. 2. Emphysema. Small upper lobe pulmonary nodules measuring up to 4 mm, not clearly identified on comparison CT from 2018. These are nonspecific in appearance and could be infectious, inflammatory or given the liver findings, metastatic in origin. Attention on follow-up CT chest imaging. 3. Multiple hepatic masses concerning for metastatic disease as seen on the dedicated abdominopelvic CT. Aortic Atherosclerosis (ICD10-I70.0) and Emphysema (ICD10-J43.9). Electronically Signed   By: Jasmine Pang M.D.   On: 12/28/2022 01:03    Scheduled Meds:  bisacodyl  10 mg Oral QHS   insulin aspart  0-15 Units Subcutaneous TID WC   insulin aspart  0-5 Units Subcutaneous QHS   montelukast  10 mg Oral QHS   polyethylene glycol  17 g Oral Daily   Continuous Infusions:  cefTRIAXone (ROCEPHIN)  IV Stopped (12/29/22 1415)   Time spent 55 minutes    LOS: 3 days    Gillis Santa, MD [facility]  If 7PM-7AM, please contact night-coverage [email or web address]  12/29/2022, 3:54 PM

## 2022-12-29 NOTE — Progress Notes (Signed)
Peter Camacho   DOB:December 12, 1960   ZH#:[id number]    ASSESSMENT & PLAN:  62 year old male with obesity, history of prostate cancer presented with severe abdominal pain.  He was found to have diffuse liver metastases.  Biopsy-proven adenocarcinoma with focal necrosis.  His LDH is significantly elevated.  CA 19-9 is elevated.  Clinical process suggest an aggressive adenocarcinoma of rapid turnover.  His liver is infiltrated with metastases.  We discussed the results today.  I also spoke to friend Peter Camacho, and his brother who later came in the room today.  Unfortunately, this is not a curable malignancy.  Treatment will require systemic therapy that is ongoing, including chemotherapy.  He has expressed he does not want chemotherapy knowing what his friends went through.  We also talked about because of liver damage from malignancy, his ability to metabolize chemotherapy likely will be diminished as well.  He likely will incur other complication, more side effects.  Hospice is reasonable in this setting.  Recommend hospice consult.  All questions were answered.    Thank you for the consult. Will sign off.  Please do not hesitate to call with any questions.  Peter Sartorius, MD 12/29/2022 1:35 PM  Subjective:  Peter Camacho reports feeling better today.  Pain is better controlled today.  Able to have bowel movement.  No bloody stool or bloody urine.  He denies any nausea vomiting.  Pathology returned today and we will talk about the pathology results.  His brother came in during the visit.  LIVER, RIGHT LOBE, BIOPSY:  - Adenocarcinoma with focal necrosis   Objective:  Vitals:   12/28/22 2248 12/29/22 0426  BP: 124/80 122/89  Pulse: (!) 110 (!) 110  Resp: 16 16  Temp: 98.2 F (36.8 C) (!) 97.3 F (36.3 C)  SpO2: 100% 94%     Intake/Output Summary (Last 24 hours) at 12/29/2022 1335 Last data filed at 12/29/2022 1002 Gross per 24 hour  Intake 894 ml  Output 550 ml  Net 344 ml    GENERAL: alert, no  distress and comfortable SKIN: skin color normal EYES: normal, sclera clear LUNGS: No respiratory distress, normal breathing effort.  Musculoskeletal: no lower extremity edema   Labs:  Recent Labs    12/25/22 0407 12/26/22 0546 12/27/22 0615 12/28/22 0533 12/29/22 0601  NA 136   < > 133* 135 135  K 3.8   < > 3.9 4.0 3.9  CL 102   < > 96* 99 98  CO2 24   < > 28 27 26   GLUCOSE 113*   < > 110* 95 94  BUN 14   < > 19 16 19   CREATININE 0.97   < > 1.03 0.90 0.78  CALCIUM 8.5*   < > 8.5* 8.7* 8.6*  GFRNONAA >60   < > >60 >60 >60  PROT 7.3  --  7.2 7.1 6.9  ALBUMIN 2.9*  --  2.6* 2.6* 2.5*  AST 90*  --  75* 74* 82*  ALT 142*  --  95* 84* 81*  ALKPHOS 280*  --  222* 229* 268*  BILITOT 1.2  --  1.0 1.4* 1.3*  BILIDIR 0.5*  --   --   --   --   IBILI 0.7  --   --   --   --    < > = values in this interval not displayed.    Studies:  CT CHEST W CONTRAST  Result Date: 12/28/2022 CLINICAL DATA:  Hepatic masses assess for malignancy  EXAM: CT CHEST WITH CONTRAST TECHNIQUE: Multidetector CT imaging of the chest was performed during intravenous contrast administration. RADIATION DOSE REDUCTION: This exam was performed according to the departmental dose-optimization program which includes automated exposure control, adjustment of the mA and/or kV according to patient size and/or use of iterative reconstruction technique. CONTRAST:  75mL OMNIPAQUE IOHEXOL 300 MG/ML  SOLN COMPARISON:  Chest x-ray 12/24/2022, CT 11/29/2016, 12/24/2022 FINDINGS: Cardiovascular: Nonaneurysmal aorta. Mild atherosclerosis. Normal cardiac size. No pericardial effusion. Mediastinum/Nodes: Midline trachea. No thyroid mass. No suspicious lymph nodes. Esophagus within normal limits. Lungs/Pleura: Small right-sided pleural effusion. Atelectasis at the left base. Favor passive atelectasis at the right lower lobe. Punctate 3 mm right apical pulmonary nodule, series 5, image 40. 4 mm left upper lobe pulmonary nodule series 5,  image 38. These are not clearly identified on comparison CT from 2018. emphysema. Upper Abdomen: Multiple hepatic masses concerning for metastatic disease. No acute finding Musculoskeletal: No acute osseous abnormality. No suspicious bone lesion IMPRESSION: 1. Small right-sided pleural effusion with passive atelectasis at the right lower lobe. 2. Emphysema. Small upper lobe pulmonary nodules measuring up to 4 mm, not clearly identified on comparison CT from 2018. These are nonspecific in appearance and could be infectious, inflammatory or given the liver findings, metastatic in origin. Attention on follow-up CT chest imaging. 3. Multiple hepatic masses concerning for metastatic disease as seen on the dedicated abdominopelvic CT. Aortic Atherosclerosis (ICD10-I70.0) and Emphysema (ICD10-J43.9). Electronically Signed   By: Jasmine Pang M.D.   On: 12/28/2022 01:03   US BIOPSY (LIVER)  Result Date: 12/25/2022 INDICATION: History of prostate carcinoma and diffuse masses throughout the liver parenchyma consistent with metastatic disease potentially of prostate or other origin. The patient presents for liver biopsy. EXAM: ULTRASOUND GUIDED CORE BIOPSY OF LIVER MASS MEDICATIONS: None. ANESTHESIA/SEDATION: Moderate (conscious) sedation was employed during this procedure. A total of Versed 1.0 mg and Fentanyl 50 mcg was administered intravenously. Moderate Sedation Time: 10 minutes. The patient's level of consciousness and vital signs were monitored continuously by radiology nursing throughout the procedure under my direct supervision. PROCEDURE: The procedure, risks, benefits, and alternatives were explained to the patient. Questions regarding the procedure were encouraged and answered. The patient understands and consents to the procedure. A time-out was performed prior to initiating the procedure. Ultrasound was performed of the liver to localize lesions. The abdominal wall was prepped with chlorhexidine in a sterile  fashion, and a sterile drape was applied covering the operative field. A sterile gown and sterile gloves were used for the procedure. Local anesthesia was provided with 1% Lidocaine. Under ultrasound guidance, a 17 gauge trocar needle was advanced into the right lobe of the liver. After confirming needle tip position, 3 separate coaxial 18 gauge core biopsy samples were obtained at the level of a lesion. Core biopsy samples were submitted in formalin. A slurry of Gel-Foam pledgets in sterile saline was then slowly injected as the outer needle was retracted and removed. Additional ultrasound was performed. COMPLICATIONS: None immediate. FINDINGS: A multitude of mass lesions are seen throughout the liver parenchyma. The largest confluent region of tumor in the right lobe was targeted measuring roughly 8 cm in diameter as seen by CT. Solid tissue was obtained. IMPRESSION: Ultrasound-guided core biopsy of tumor within the right lobe of the liver at the level of tumor mass measuring roughly 8 cm in diameter. Electronically Signed   By: Irish Lack M.D.   On: 12/25/2022 15:08   DG Chest 2 View  Result Date:  12/24/2022 CLINICAL DATA:  Severe right-sided abdominal pain and tachycardia EXAM: CHEST - 2 VIEW COMPARISON:  09/10/2022 FINDINGS: Stable cardiomediastinal silhouette. Aortic atherosclerotic calcification. Left basilar atelectasis. Otherwise no focal consolidation, pleural effusion, or pneumothorax. No displaced rib fractures. IMPRESSION: No acute cardiopulmonary disease. Electronically Signed   By: Minerva Fester M.D.   On: 12/24/2022 23:50   CT ABDOMEN PELVIS W CONTRAST  Result Date: 12/24/2022 CLINICAL DATA:  Right-sided abdominal pain EXAM: CT ABDOMEN AND PELVIS WITH CONTRAST TECHNIQUE: Multidetector CT imaging of the abdomen and pelvis was performed using the standard protocol following bolus administration of intravenous contrast. RADIATION DOSE REDUCTION: This exam was performed according to the  departmental dose-optimization program which includes automated exposure control, adjustment of the mA and/or kV according to patient size and/or use of iterative reconstruction technique. CONTRAST:  OMNIPAQUE IOHEXOL 300 MG/ML  SOLN COMPARISON:  11/21/2013 CT pelvis FINDINGS: Lower chest: No acute abnormality. Hepatobiliary: Numerous hypoattenuating lesions throughout the liver measuring up to 8.0 cm in the right hepatic lobe. Unremarkable gallbladder and biliary tree. Pancreas: Unremarkable. Spleen: Unremarkable. Adrenals/Urinary Tract: Normal adrenal glands. Punctate nonobstructing bilateral nephrolithiasis. No obstructing urinary calculi or hydronephrosis. Unremarkable bladder. Stomach/Bowel: Normal caliber large and small bowel. Colonic diverticulosis greatest about the transverse colon. Possible mild wall thickening versus underdistention about the transverse colon. Normal appendix. Stomach is within normal limits. Vascular/Lymphatic: No acute vascular abnormality. 1.9 cm precaval lymph node (series 301/image 42). Reproductive: Metallic seeds in the prostate. Other: No free intraperitoneal fluid or air. Musculoskeletal: No acute fracture or destructive osseous lesion. IMPRESSION: 1. Numerous hypoattenuating lesions throughout the liver measuring up to 8.0 cm in the right hepatic lobe, concerning for metastases of unknown primary. 2. 1.9 cm precaval lymph node, suspicious for metastatic disease. 3. Punctate nonobstructing bilateral nephrolithiasis. 4. Colonic diverticulosis greatest about the transverse colon where there is mild wall thickening. This could be due to mild diverticulitis or underdistention. Electronically Signed   By: Minerva Fester M.D.   On: 12/24/2022 23:49

## 2022-12-29 NOTE — Plan of Care (Signed)
  Problem: Education: Goal: Knowledge of General Education information will improve Description: Including pain rating scale, medication(s)/side effects and non-pharmacologic comfort measures Outcome: Progressing   Problem: Nutrition: Goal: Adequate nutrition will be maintained Outcome: Progressing   Problem: Coping: Goal: Level of anxiety will decrease Outcome: Progressing   Problem: Elimination: Goal: Will not experience complications related to bowel motility Outcome: Progressing   Problem: Safety: Goal: Ability to remain free from injury will improve Outcome: Progressing   Problem: Skin Integrity: Goal: Risk for impaired skin integrity will decrease Outcome: Progressing   Problem: Education: Goal: Ability to describe self-care measures that may prevent or decrease complications (Diabetes Survival Skills Education) will improve Outcome: Progressing

## 2022-12-29 NOTE — Progress Notes (Signed)
Mobility Specialist - Progress Note   12/29/22 1043  Mobility  Activity Ambulated with assistance in hallway  Level of Assistance Standby assist, set-up cues, supervision of patient - no hands on  Assistive Device Front wheel walker  Distance Ambulated (ft) 40 ft  Activity Response Tolerated well  Mobility Referral Yes  $Mobility charge 1 Mobility  Mobility Specialist Start Time (ACUTE ONLY) 1029  Mobility Specialist Stop Time (ACUTE ONLY) 1042  Mobility Specialist Time Calculation (min) (ACUTE ONLY) 13 min   Pt received in recliner and agreeable to mobility. C/o feeling dizzy during ambulation which halted further ambulation. Pt to recliner after session with all needs met.    [facility]

## 2022-12-30 DIAGNOSIS — C787 Secondary malignant neoplasm of liver and intrahepatic bile duct: Secondary | ICD-10-CM | POA: Diagnosis not present

## 2022-12-30 LAB — CBC
HCT: 37.7 % — ABNORMAL LOW (ref 39.0–52.0)
Hemoglobin: 12.6 g/dL — ABNORMAL LOW (ref 13.0–17.0)
MCH: 30.1 pg (ref 26.0–34.0)
MCHC: 33.4 g/dL (ref 30.0–36.0)
MCV: 90.2 fL (ref 80.0–100.0)
Platelets: 244 10*3/uL (ref 150–400)
RBC: 4.18 MIL/uL — ABNORMAL LOW (ref 4.22–5.81)
RDW: 16 % — ABNORMAL HIGH (ref 11.5–15.5)
WBC: 9.7 10*3/uL (ref 4.0–10.5)
nRBC: 0 % (ref 0.0–0.2)

## 2022-12-30 LAB — CULTURE, BLOOD (ROUTINE X 2)
Culture: NO GROWTH
Culture: NO GROWTH
Special Requests: ADEQUATE
Special Requests: ADEQUATE

## 2022-12-30 LAB — COMPREHENSIVE METABOLIC PANEL
ALT: 85 U/L — ABNORMAL HIGH (ref 0–44)
AST: 95 U/L — ABNORMAL HIGH (ref 15–41)
Albumin: 2.6 g/dL — ABNORMAL LOW (ref 3.5–5.0)
Alkaline Phosphatase: 342 U/L — ABNORMAL HIGH (ref 38–126)
Anion gap: 12 (ref 5–15)
BUN: 22 mg/dL (ref 8–23)
CO2: 25 mmol/L (ref 22–32)
Calcium: 8.6 mg/dL — ABNORMAL LOW (ref 8.9–10.3)
Chloride: 99 mmol/L (ref 98–111)
Creatinine, Ser: 0.95 mg/dL (ref 0.61–1.24)
GFR, Estimated: >60 mL/min (ref >60)
Glucose, Bld: 102 mg/dL — ABNORMAL HIGH (ref 70–99)
Potassium: 3.9 mmol/L (ref 3.5–5.1)
Sodium: 136 mmol/L (ref 135–145)
Total Bilirubin: 1.6 mg/dL — ABNORMAL HIGH (ref 0.3–1.2)
Total Protein: 7.1 g/dL (ref 6.5–8.1)

## 2022-12-30 LAB — GLUCOSE, CAPILLARY: Glucose-Capillary: 109 mg/dL — ABNORMAL HIGH (ref 70–99)

## 2022-12-30 LAB — AFP TUMOR MARKER: AFP, Serum, Tumor Marker: <1.8 ng/mL (ref 0.0–8.4)

## 2022-12-30 MED ORDER — BISACODYL 5 MG PO TBEC
10.0000 mg | DELAYED_RELEASE_TABLET | Freq: Every day | ORAL | 0 refills | Status: AC | PRN
  Filled 2022-12-30: qty 30, 15d supply, fill #0
  Filled 2023-01-01: qty 100, 50d supply, fill #0

## 2022-12-30 MED ORDER — HYDROXYZINE HCL 25 MG PO TABS
25.0000 mg | ORAL_TABLET | Freq: Three times a day (TID) | ORAL | 0 refills | Status: AC | PRN
  Filled 2022-12-30 – 2023-01-01 (×2): qty 30, 10d supply, fill #0

## 2022-12-30 MED ORDER — ACETAMINOPHEN 500 MG PO TABS: 500.0000 mg | ORAL_TABLET | Freq: Three times a day (TID) | ORAL | Status: AC | PRN

## 2022-12-30 MED ORDER — POLYETHYLENE GLYCOL 3350 17 G PO PACK
17.0000 g | PACK | Freq: Every day | ORAL | 0 refills | Status: AC
  Filled 2022-12-30 – 2023-01-01 (×2): qty 14, 14d supply, fill #0

## 2022-12-30 MED ORDER — AMOXICILLIN-POT CLAVULANATE 875-125 MG PO TABS
1.0000 | ORAL_TABLET | Freq: Two times a day (BID) | ORAL | Status: DC
  Administered 2022-12-30: 1 via ORAL
  Filled 2022-12-30: qty 1

## 2022-12-30 MED ORDER — AMOXICILLIN-POT CLAVULANATE 875-125 MG PO TABS
1.0000 | ORAL_TABLET | Freq: Two times a day (BID) | ORAL | 0 refills | Status: AC
  Filled 2022-12-30 – 2023-01-01 (×2): qty 10, 5d supply, fill #0

## 2022-12-30 NOTE — Plan of Care (Addendum)
Patient is alert and oriented, all discharge paperwork went over with him, PIV removed, gauze and tape in place. Patient had no further questions at this time. Medications and pharmacy pick up went over with patient and patient to await for ride in discharge lounge.  Problem: Education: Goal: Knowledge of General Education information will improve Description: Including pain rating scale, medication(s)/side effects and non-pharmacologic comfort measures Outcome: Completed/Met   Problem: Health Behavior/Discharge Planning: Goal: Ability to manage health-related needs will improve Outcome: Completed/Met   Problem: Clinical Measurements: Goal: Ability to maintain clinical measurements within normal limits will improve Outcome: Completed/Met Goal: Will remain free from infection Outcome: Completed/Met Goal: Diagnostic test results will improve Outcome: Completed/Met Goal: Respiratory complications will improve Outcome: Completed/Met Goal: Cardiovascular complication will be avoided Outcome: Completed/Met   Problem: Activity: Goal: Risk for activity intolerance will decrease Outcome: Completed/Met   Problem: Nutrition: Goal: Adequate nutrition will be maintained Outcome: Completed/Met   Problem: Coping: Goal: Level of anxiety will decrease Outcome: Completed/Met   Problem: Elimination: Goal: Will not experience complications related to bowel motility Outcome: Completed/Met Goal: Will not experience complications related to urinary retention Outcome: Completed/Met   Problem: Pain Management: Goal: General experience of comfort will improve Outcome: Completed/Met   Problem: Safety: Goal: Ability to remain free from injury will improve Outcome: Completed/Met   Problem: Skin Integrity: Goal: Risk for impaired skin integrity will decrease Outcome: Completed/Met   Problem: Education: Goal: Ability to describe self-care measures that may prevent or decrease complications  (Diabetes Survival Skills Education) will improve Outcome: Completed/Met Goal: Individualized Educational Video(s) Outcome: Completed/Met   Problem: Coping: Goal: Ability to adjust to condition or change in health will improve Outcome: Completed/Met   Problem: Fluid Volume: Goal: Ability to maintain a balanced intake and output will improve Outcome: Completed/Met   Problem: Health Behavior/Discharge Planning: Goal: Ability to identify and utilize available resources and services will improve Outcome: Completed/Met Goal: Ability to manage health-related needs will improve Outcome: Completed/Met   Problem: Metabolic: Goal: Ability to maintain appropriate glucose levels will improve Outcome: Completed/Met   Problem: Nutritional: Goal: Maintenance of adequate nutrition will improve Outcome: Completed/Met Goal: Progress toward achieving an optimal weight will improve Outcome: Completed/Met   Problem: Skin Integrity: Goal: Risk for impaired skin integrity will decrease Outcome: Completed/Met   Problem: Tissue Perfusion: Goal: Adequacy of tissue perfusion will improve Outcome: Completed/Met

## 2022-12-30 NOTE — Discharge Summary (Signed)
[facility] Discharge Summary   Patient: Peter Camacho YQI:[id number]  PCP: Sharlene Dory, DO  Date of admission: 12/24/2022   Date of discharge:  12/30/2022     Discharge Diagnoses:  Principal Problem:   Metastatic cancer to liver [facility]) Active Problems:   Intractable nausea and vomiting   Liver mass   Obesity (BMI 30-39.9)   Liver dysfunction   Admitted From: Home Disposition:  Home   Recommendations for Outpatient Follow-up:  Follow-up with PCP in 1 week.  Repeat LFTs in 1 to 2 weeks. Follow-up with oncologist in 1 week. Follow up LABS/TEST:  as above   Follow-up Information     Sharlene Dory, DO Follow up in 1 week(s).   Specialty: Family Medicine Contact information: [address] [place] [address] [phone]         Melven Sartorius, MD Follow up in 1 week(s).   Specialty: Oncology Contact information: [address] [place] [address] [phone]                Diet recommendation: Cardiac diet  Activity: The patient is advised to gradually reintroduce usual activities, as tolerated  Discharge Condition: stable  Code Status: Full code   History of present illness: As per the H and P dictated on admission, Hospital Course:  LAMOINE MAGALLON is a 62 y.o. male with medical history significant for hypertension, obesity, prostate cancer treated with prostate seed implants - been admitted to the hospital with intractable abdominal pain nausea and vomiting found to have large liver mass.  Assessment & Plan:   # Intractable abdominal pain, nausea, vomiting Ruq pain likely 2/2 liver masses. Nothing else acute seen on ct a/p, On 11/1 FLD, advance to diabetic diet. S/p P.o. oxycodone prn, supportive care, Zofran, Phenergan alternating.  Abdominal pain resolved, no nausea vomiting.  Patient is tolerating diet well.   # Adenocarcinoma with liver metastasis Patient presented with new liver mass Prior  history of prostate cancer S/p liver biopsy performed 12/25/2022, found to have adenocarcinoma with focal necrosis -prostate cancer treated 8-9 years ago with [facility]-Dr. Manning/urology Dr. Vernie Ammons). Patient refused MRI brain due to unable to fit secondary to obesity.  Recommend open MRI as an outpatient. CT Chest: Small right effusion, RLL atelectasis.  Emphysema, pulmonary nodule 4 mm, possible metastasis. Dr. Cherly Hensen of oncology consulted, recommended further workup and biopsy which was done and reported adenocarcinoma.  Dr. Cherly Hensen recommended palliative care and hospice, patient does not want as it is not a curable cancer.   # UTI, leukocytosis and Lactic acidosis UA positive, s/p ceftriaxone 1 g IV daily.  Urine culture NGTD due to symptoms patient was given Augmentin twice daily for 5-day course. # Hematuria developed on 10/31, Resolved on 11/1 Discontinued Lovenox. Bladder scan negative for retention Urology consulted, recommended conservative management, hematuria resolved and patient was discharged home on oral antibiotics.  Recommend to follow-up with urology for recurrent hematuria. # Hypertension: -Currently well controlled off home medications (holding hydrochlorothiazide) monitor BP at home and follow with PCP. # Obesity, morbid Body mass index is 40.32 kg/m. Discussed diet, weight loss, activity regimen  - Patient was instructed, not to drive, operate heavy machinery, perform activities at heights, swimming or participation in water activities or provide baby sitting services while on Pain, Sleep and Anxiety Medications; until his outpatient Physician has advised to do so again.  - Also recommended to not to take more than prescribed Pain, Sleep and Anxiety  Medications.  Patient was ambulatory without any assistance. On the day of the discharge the patient's vitals were stable, and no other acute medical condition were reported by patient. the patient was felt safe to be discharge  at Home.  Consultants: Heme-onc and urology Procedures: s/p liver biopsy, shows adenocarcinoma  Discharge Exam: General: Appear in no distress, no Rash; Oral Mucosa Clear, moist. Cardiovascular: S1 and S2 Present, no Murmur, Respiratory: normal respiratory effort, Bilateral Air entry present and no Crackles, no wheezes Abdomen: Bowel Sound present, Soft and no tenderness, no hernia Extremities: no Pedal edema, no calf tenderness Neurology: alert and oriented to time, place, and person affect appropriate.  Filed Weights   12/24/22 2131  Weight: (!) 142.4 kg   Vitals:   12/29/22 2159 12/30/22 0604  BP: 132/75 115/70  Pulse: (!) 107   Resp: 20 20  Temp: 98.3 F (36.8 C) 97.7 F (36.5 C)  SpO2: 96% 97%    DISCHARGE MEDICATION: Allergies as of 12/30/2022       Reactions   Ace Inhibitors Anaphylaxis   Eicosapentaenoic Acid Shortness Of Breath, Itching, Rash, Other (See Comments)   VASCEPA   Fish-derived Products Shortness Of Breath, Itching, Rash   Shellfish-derived Products Shortness Of Breath, Itching, Rash        Medication List     STOP taking these medications    amLODipine 5 MG tablet Commonly known as: NORVASC   azithromycin 250 MG tablet Commonly known as: ZITHROMAX   hydrochlorothiazide 25 MG tablet Commonly known as: HYDRODIURIL   metFORMIN 500 MG 24 hr tablet Commonly known as: GLUCOPHAGE-XR   predniSONE 20 MG tablet Commonly known as: DELTASONE       TAKE these medications    acetaminophen 500 MG tablet Commonly known as: TYLENOL Take 1 tablet (500 mg total) by mouth every 8 (eight) hours as needed for mild pain (pain score 1-3), moderate pain (pain score 4-6), fever or headache. What changed: when to take this   albuterol 108 (90 Base) MCG/ACT inhaler Commonly known as: VENTOLIN HFA Inhale 2 puffs into the lungs every 6 (six) hours as needed for wheezing or shortness of breath.   amoxicillin-clavulanate 875-125 MG tablet Commonly  known as: AUGMENTIN Take 1 tablet by mouth every 12 (twelve) hours for 5 days.   bisacodyl 5 MG EC tablet Commonly known as: DULCOLAX Take 2 tablets (10 mg total) by mouth daily as needed for moderate constipation.   cetirizine 10 MG tablet Commonly known as: ZYRTEC Take 1 tablet (10 mg total) by mouth daily. What changed:  when to take this reasons to take this   EPINEPHrine 0.3 mg/0.3 mL Soaj injection Commonly known as: EPI-PEN Inject 0.3 mg into the muscle as needed for anaphylaxis.   fluticasone 50 MCG/ACT nasal spray Commonly known as: FLONASE Place 2 sprays into both nostrils daily. What changed:  when to take this reasons to take this   hydrOXYzine 25 MG tablet Commonly known as: ATARAX Take 1 tablet (25 mg total) by mouth 3 (three) times daily as needed for anxiety.   Mens 50+ Multivitamin Tabs Take 1 tablet by mouth daily with breakfast.   montelukast 10 MG tablet Commonly known as: SINGULAIR Take 1 tablet (10 mg total) by mouth at bedtime as needed. What changed:  when to take this reasons to take this   olopatadine 0.1 % ophthalmic solution Commonly known as: Pataday Place 1 drop into both eyes 2 (two) times daily. What changed:  when to take this  reasons to take this   polyethylene glycol 17 g packet Commonly known as: MIRALAX / GLYCOLAX Take 17 g by mouth daily. Start taking on: December 31, 2022   triamcinolone cream 0.5 % Commonly known as: KENALOG Apply 1 application on to the skin 2 (two) times daily. What changed:  when to take this reasons to take this               Discharge Care Instructions  (From admission, onward)           Start     Ordered   12/30/22 0000  Discharge wound care:       Comments: As above   12/30/22 1215           Allergies  Allergen Reactions   Ace Inhibitors Anaphylaxis   Eicosapentaenoic Acid Shortness Of Breath, Itching, Rash and Other (See Comments)    VASCEPA   Fish-Derived Products  Shortness Of Breath, Itching and Rash   Shellfish-Derived Products Shortness Of Breath, Itching and Rash   Discharge Instructions     Call MD for:  difficulty breathing, headache or visual disturbances   Complete by: As directed    Call MD for:  extreme fatigue   Complete by: As directed    Call MD for:  persistant dizziness or light-headedness   Complete by: As directed    Call MD for:  persistant nausea and vomiting   Complete by: As directed    Call MD for:  severe uncontrolled pain   Complete by: As directed    Call MD for:  temperature >100.4   Complete by: As directed    Diet regular   Complete by: As directed    Discharge instructions   Complete by: As directed    Follow-up with PCP in 1 week.  Repeat LFTs in 1 to 2 weeks. Follow-up with oncologist in 1 week.   Discharge wound care:   Complete by: As directed    As above   Increase activity slowly   Complete by: As directed        The results of significant diagnostics from this hospitalization (including imaging, microbiology, ancillary and laboratory) are listed below for reference.    Significant Diagnostic Studies: CT CHEST W CONTRAST  Result Date: 12/28/2022 CLINICAL DATA:  Hepatic masses assess for malignancy EXAM: CT CHEST WITH CONTRAST TECHNIQUE: Multidetector CT imaging of the chest was performed during intravenous contrast administration. RADIATION DOSE REDUCTION: This exam was performed according to the departmental dose-optimization program which includes automated exposure control, adjustment of the mA and/or kV according to patient size and/or use of iterative reconstruction technique. CONTRAST:  75mL OMNIPAQUE IOHEXOL 300 MG/ML  SOLN COMPARISON:  Chest x-ray 12/24/2022, CT 11/29/2016, 12/24/2022 FINDINGS: Cardiovascular: Nonaneurysmal aorta. Mild atherosclerosis. Normal cardiac size. No pericardial effusion. Mediastinum/Nodes: Midline trachea. No thyroid mass. No suspicious lymph nodes. Esophagus within  normal limits. Lungs/Pleura: Small right-sided pleural effusion. Atelectasis at the left base. Favor passive atelectasis at the right lower lobe. Punctate 3 mm right apical pulmonary nodule, series 5, image 40. 4 mm left upper lobe pulmonary nodule series 5, image 38. These are not clearly identified on comparison CT from 2018. emphysema. Upper Abdomen: Multiple hepatic masses concerning for metastatic disease. No acute finding Musculoskeletal: No acute osseous abnormality. No suspicious bone lesion IMPRESSION: 1. Small right-sided pleural effusion with passive atelectasis at the right lower lobe. 2. Emphysema. Small upper lobe pulmonary nodules measuring up to 4 mm, not clearly identified on comparison CT  from 2018. These are nonspecific in appearance and could be infectious, inflammatory or given the liver findings, metastatic in origin. Attention on follow-up CT chest imaging. 3. Multiple hepatic masses concerning for metastatic disease as seen on the dedicated abdominopelvic CT. Aortic Atherosclerosis (ICD10-I70.0) and Emphysema (ICD10-J43.9). Electronically Signed   By: Jasmine Pang M.D.   On: 12/28/2022 01:03   US BIOPSY (LIVER)  Result Date: 12/25/2022 INDICATION: History of prostate carcinoma and diffuse masses throughout the liver parenchyma consistent with metastatic disease potentially of prostate or other origin. The patient presents for liver biopsy. EXAM: ULTRASOUND GUIDED CORE BIOPSY OF LIVER MASS MEDICATIONS: None. ANESTHESIA/SEDATION: Moderate (conscious) sedation was employed during this procedure. A total of Versed 1.0 mg and Fentanyl 50 mcg was administered intravenously. Moderate Sedation Time: 10 minutes. The patient's level of consciousness and vital signs were monitored continuously by radiology nursing throughout the procedure under my direct supervision. PROCEDURE: The procedure, risks, benefits, and alternatives were explained to the patient. Questions regarding the procedure were  encouraged and answered. The patient understands and consents to the procedure. A time-out was performed prior to initiating the procedure. Ultrasound was performed of the liver to localize lesions. The abdominal wall was prepped with chlorhexidine in a sterile fashion, and a sterile drape was applied covering the operative field. A sterile gown and sterile gloves were used for the procedure. Local anesthesia was provided with 1% Lidocaine. Under ultrasound guidance, a 17 gauge trocar needle was advanced into the right lobe of the liver. After confirming needle tip position, 3 separate coaxial 18 gauge core biopsy samples were obtained at the level of a lesion. Core biopsy samples were submitted in formalin. A slurry of Gel-Foam pledgets in sterile saline was then slowly injected as the outer needle was retracted and removed. Additional ultrasound was performed. COMPLICATIONS: None immediate. FINDINGS: A multitude of mass lesions are seen throughout the liver parenchyma. The largest confluent region of tumor in the right lobe was targeted measuring roughly 8 cm in diameter as seen by CT. Solid tissue was obtained. IMPRESSION: Ultrasound-guided core biopsy of tumor within the right lobe of the liver at the level of tumor mass measuring roughly 8 cm in diameter. Electronically Signed   By: Irish Lack M.D.   On: 12/25/2022 15:08   DG Chest 2 View  Result Date: 12/24/2022 CLINICAL DATA:  Severe right-sided abdominal pain and tachycardia EXAM: CHEST - 2 VIEW COMPARISON:  09/10/2022 FINDINGS: Stable cardiomediastinal silhouette. Aortic atherosclerotic calcification. Left basilar atelectasis. Otherwise no focal consolidation, pleural effusion, or pneumothorax. No displaced rib fractures. IMPRESSION: No acute cardiopulmonary disease. Electronically Signed   By: Minerva Fester M.D.   On: 12/24/2022 23:50   CT ABDOMEN PELVIS W CONTRAST  Result Date: 12/24/2022 CLINICAL DATA:  Right-sided abdominal pain EXAM:  CT ABDOMEN AND PELVIS WITH CONTRAST TECHNIQUE: Multidetector CT imaging of the abdomen and pelvis was performed using the standard protocol following bolus administration of intravenous contrast. RADIATION DOSE REDUCTION: This exam was performed according to the departmental dose-optimization program which includes automated exposure control, adjustment of the mA and/or kV according to patient size and/or use of iterative reconstruction technique. CONTRAST:  OMNIPAQUE IOHEXOL 300 MG/ML  SOLN COMPARISON:  11/21/2013 CT pelvis FINDINGS: Lower chest: No acute abnormality. Hepatobiliary: Numerous hypoattenuating lesions throughout the liver measuring up to 8.0 cm in the right hepatic lobe. Unremarkable gallbladder and biliary tree. Pancreas: Unremarkable. Spleen: Unremarkable. Adrenals/Urinary Tract: Normal adrenal glands. Punctate nonobstructing bilateral nephrolithiasis. No obstructing urinary calculi or hydronephrosis.  Unremarkable bladder. Stomach/Bowel: Normal caliber large and small bowel. Colonic diverticulosis greatest about the transverse colon. Possible mild wall thickening versus underdistention about the transverse colon. Normal appendix. Stomach is within normal limits. Vascular/Lymphatic: No acute vascular abnormality. 1.9 cm precaval lymph node (series 301/image 42). Reproductive: Metallic seeds in the prostate. Other: No free intraperitoneal fluid or air. Musculoskeletal: No acute fracture or destructive osseous lesion. IMPRESSION: 1. Numerous hypoattenuating lesions throughout the liver measuring up to 8.0 cm in the right hepatic lobe, concerning for metastases of unknown primary. 2. 1.9 cm precaval lymph node, suspicious for metastatic disease. 3. Punctate nonobstructing bilateral nephrolithiasis. 4. Colonic diverticulosis greatest about the transverse colon where there is mild wall thickening. This could be due to mild diverticulitis or underdistention. Electronically Signed   By: Minerva Fester M.D.   On: 12/24/2022 23:49    Microbiology: Recent Results (from the past 240 hour(s))  Culture, blood (Routine X 2) w Reflex to ID Panel     Status: None   Collection Time: 12/25/22  4:07 AM   Specimen: BLOOD  Result Value Ref Range Status   Specimen Description   Final    BLOOD LEFT ANTECUBITAL Performed at [facility], [address] [place], [place] [address]    Special Requests   Final    Blood Culture adequate volume BOTTLES DRAWN AEROBIC AND ANAEROBIC Performed at [facility], [address], [place], [place] [address]    Culture   Final    NO GROWTH 5 DAYS Performed at [facility], [address], [place], [place] [address]    Report Status 12/30/2022 FINAL  Final  Culture, blood (Routine X 2) w Reflex to ID Panel     Status: None   Collection Time: 12/25/22  4:07 AM   Specimen: BLOOD  Result Value Ref Range Status   Specimen Description   Final    BLOOD BLOOD LEFT HAND Performed at [facility], [address], [place], [place] [address]    Special Requests   Final    Blood Culture adequate volume BOTTLES DRAWN AEROBIC AND ANAEROBIC Performed at [facility], [address] [place], [place] [address]    Culture   Final    NO GROWTH 5 DAYS Performed at [facility], [address] [place], [place] [address]    Report Status 12/30/2022 FINAL  Final  Urine Culture     Status: None   Collection Time: 12/28/22 11:10 AM   Specimen: Urine, Random  Result Value Ref Range Status   Specimen Description   Final    URINE, RANDOM Performed at [facility], [address], [place], [place] [address]    Special Requests   Final    NONE Reflexed from [id number] Performed at [facility], [address], [place], [place] [address]    Culture   Final    NO GROWTH Performed at [facility], [address] [place], [place]  [address]    Report Status 12/29/2022 FINAL  Final     Labs: CBC: Recent Labs  Lab 12/26/22 0546 12/27/22 0615 12/28/22 0533 12/29/22 0601 12/30/22 0513  WBC 11.3* 10.5 9.6 9.2 9.7  HGB 12.7* 12.0* 11.8* 11.5* 12.6*  HCT 40.7 38.0* 36.2* 35.3* 37.7*  MCV 94.7 93.8 91.2 91.7 90.2  PLT 261 243 246 249 244   Basic Metabolic Panel: Recent Labs  Lab 12/25/22 0407 12/26/22 0546 12/27/22  2595 12/28/22 0533 12/29/22 0601 12/30/22 0513  NA 136 138 133* 135 135 136  K 3.8 4.0 3.9 4.0 3.9 3.9  CL 102 100 96* 99 98 99  CO2 24 25 28 27 26 25   GLUCOSE 113* 104* 110* 95 94 102*  BUN 14 20 19 16 19 22   CREATININE 0.97 1.01 1.03 0.90 0.78 0.95  CALCIUM 8.5* 8.9 8.5* 8.7* 8.6* 8.6*  MG 1.9  --   --   --   --   --   PHOS 3.9  --   --   --   --   --    Liver Function Tests: Recent Labs  Lab 12/25/22 0407 12/27/22 0615 12/28/22 0533 12/29/22 0601 12/30/22 0513  AST 90* 75* 74* 82* 95*  ALT 142* 95* 84* 81* 85*  ALKPHOS 280* 222* 229* 268* 342*  BILITOT 1.2 1.0 1.4* 1.3* 1.6*  PROT 7.3 7.2 7.1 6.9 7.1  ALBUMIN 2.9* 2.6* 2.6* 2.5* 2.6*   Recent Labs  Lab 12/24/22 2155  LIPASE 23   No results for input(s): "AMMONIA" in the last 168 hours. Cardiac Enzymes: No results for input(s): "CKTOTAL", "CKMB", "CKMBINDEX", "TROPONINI" in the last 168 hours. BNP (last 3 results) No results for input(s): "BNP" in the last 8760 hours. CBG: Recent Labs  Lab 12/29/22 0720 12/29/22 1131 12/29/22 1616 12/29/22 2158 12/30/22 0708  GLUCAP 89 108* 101* 95 109*    Time spent: 35 minutes  Signed:  Gillis Santa  [facility]  12/30/2022 12:16 PM

## 2022-12-30 NOTE — Plan of Care (Signed)
  Problem: Education: Goal: Knowledge of General Education information will improve Description: Including pain rating scale, medication(s)/side effects and non-pharmacologic comfort measures Outcome: Progressing   Problem: Coping: Goal: Level of anxiety will decrease Outcome: Progressing   Problem: Pain Management: Goal: General experience of comfort will improve Outcome: Progressing   Problem: Safety: Goal: Ability to remain free from injury will improve Outcome: Progressing   Problem: Skin Integrity: Goal: Risk for impaired skin integrity will decrease Outcome: Progressing

## 2023-01-01 ENCOUNTER — Telehealth: Payer: Self-pay

## 2023-01-01 ENCOUNTER — Other Ambulatory Visit: Payer: Self-pay

## 2023-01-01 ENCOUNTER — Other Ambulatory Visit (HOSPITAL_BASED_OUTPATIENT_CLINIC_OR_DEPARTMENT_OTHER): Payer: Self-pay

## 2023-01-01 DIAGNOSIS — G893 Neoplasm related pain (acute) (chronic): Secondary | ICD-10-CM

## 2023-01-01 MED ORDER — OXYCODONE HCL 5 MG PO TABS
5.0000 mg | ORAL_TABLET | ORAL | 0 refills | Status: DC | PRN
  Filled 2023-01-01: qty 30, 5d supply, fill #0

## 2023-01-01 NOTE — Telephone Encounter (Signed)
I called Mr. Peter Camacho but no answer.  I called and spoke to Mr. Peter Camacho care provider. Peter Camacho. [phone].  She is one of her DPOA.  I gave her an update on his condition.  I had recommended hospice while inpatient.  This recommendation remain the same.  Explained the reason for hospice referral given his significant diffuse liver metastases and dysfunction.  She says she is trying to get he has improved to get up.  She will find out whether he wants to have hospice at home versus inpatient.  She will call back on Wednesday.  Peter Camacho or Peter Camacho. When she calls back you can place a hospice order depending on his need. No follow up with [place] is needed. Thank you

## 2023-01-05 ENCOUNTER — Ambulatory Visit (INDEPENDENT_AMBULATORY_CARE_PROVIDER_SITE_OTHER): Payer: 59 | Admitting: Family Medicine

## 2023-01-05 ENCOUNTER — Other Ambulatory Visit: Payer: Self-pay

## 2023-01-05 ENCOUNTER — Telehealth: Payer: Self-pay

## 2023-01-05 ENCOUNTER — Other Ambulatory Visit (HOSPITAL_BASED_OUTPATIENT_CLINIC_OR_DEPARTMENT_OTHER): Payer: Self-pay

## 2023-01-05 ENCOUNTER — Encounter: Payer: Self-pay | Admitting: Family Medicine

## 2023-01-05 VITALS — BP 118/70 | HR 132 | Temp 98.0°F | Resp 16 | Ht 74.0 in | Wt 314.0 lb

## 2023-01-05 DIAGNOSIS — C787 Secondary malignant neoplasm of liver and intrahepatic bile duct: Secondary | ICD-10-CM | POA: Diagnosis not present

## 2023-01-05 MED ORDER — ONDANSETRON 4 MG PO TBDP
4.0000 mg | ORAL_TABLET | Freq: Three times a day (TID) | ORAL | 1 refills | Status: AC | PRN
  Filled 2023-01-05: qty 30, 10d supply, fill #0

## 2023-01-05 MED ORDER — OXYCODONE HCL 5 MG PO TABS
5.0000 mg | ORAL_TABLET | Freq: Four times a day (QID) | ORAL | 0 refills | Status: AC | PRN
  Filled 2023-01-05: qty 90, 23d supply, fill #0

## 2023-01-05 MED ORDER — MIRTAZAPINE 15 MG PO TBDP
15.0000 mg | ORAL_TABLET | Freq: Every day | ORAL | 3 refills | Status: AC
  Filled 2023-01-05: qty 30, 30d supply, fill #0

## 2023-01-05 NOTE — Progress Notes (Signed)
Chief Complaint  Patient presents with   Follow-up    Follow up    Subjective: Patient is a 62 y.o. male here for hosp f/u.  He was here with his sister and another care provider.   In late October, the patient started having severe abdominal pain.  He went to the hospital and had a CT scan showing metastasis to his liver.  There is an unknown origin but he does have a history of prostate cancer from nearly a decade ago.  Of note, over the summer his liver enzymes were unremarkable.  They were markedly elevated along with his alkaline phosphatase, while in the hospital.  He is having pain, nausea/vomiting, difficulty swallowing, poor appetite, and constipation.  He was prescribed oxycodone by the oncology team.  They stated there was nothing more they can do for him at this point and recommended hospice/palliative care.  If the patient is interested, they will facilitate this process.  The patient is asking what he can take for his appetite, pain, difficulty swallowing and nausea.    Past Medical History:  Diagnosis Date   Allergy    Asthma    At risk for sleep apnea    STOP-BANG= 5    SENT TO PCP 03-17-2014   GI bleed    Hard of hearing    History of seizure    HX menigitis x2  in middle and high school - residual seizure's on medications--  last seizure age 19   Hypertension    Obesity    Personal history of meningitis    X2  MIDDLE AND HIGH SCHOOL--  residual seizure's on medication -- last seizure age 34   Prostate cancer (HCC) MONITORED BY DR OTTELIN   stage T1c, Gleason 3+4, PSA 12.79, vol 38.5cc    Objective: BP 118/70 (BP Location: Left Arm, Patient Position: Sitting, Cuff Size: Normal)   Pulse (!) 132   Temp 98 F (36.7 C) (Oral)   Resp 16   Ht 6\' 2"  (1.88 m)   Wt (!) 314 lb (142.4 kg)   SpO2 95%   BMI 40.32 kg/m  General: Awake, appears stated age Lungs: No accessory muscle use Psych: Age appropriate judgment and insight, normal affect and mood  Assessment and  Plan: Metastatic cancer to liver (HCC) - Plan: mirtazapine (REMERON SOL-TAB) 15 MG disintegrating tablet, ondansetron (ZOFRAN-ODT) 4 MG disintegrating tablet, oxyCODONE (OXY IR/ROXICODONE) 5 MG immediate release tablet  This is an extremely sad case.  He is unintentionally down around 10 pounds.  I will prescribe some mirtazapine disintegrating tablets in addition to Zofran and ODT to help with the symptoms.  There is nothing I can do for the swallowing issue.  I will refill his pain medication until he can get set up with the hospice team.  I did offer a second opinion with a different oncologist which was politely declined at this time.  I told him I am not sure that this would be of any benefit but if he wanted to see if there is any other possibility, this would be it.  He had a disability form to fill out for work.  I completed this to the best of my ability.  It is my understanding that he will not be returning to work with this diagnosis/prognosis.  I told him to reach out to me if there is anything that I can do for him.  I will have him follow-up with [place] if needed. The patient and his sisters voiced  understanding and agreement to the plan.  I spent 41 minutes with the patient/family discussing the above plans in addition to reviewing his chart and recent hospitalization on the same day of the visit.  Jilda Roche [place], DO 01/05/23  2:48 PM

## 2023-01-05 NOTE — Patient Instructions (Signed)
Please reach out to Dr. Nelta Numbers office for hospice discussion.   Let me know if you need anything.

## 2023-01-05 NOTE — Telephone Encounter (Signed)
PA initiated via Covermymeds; KEY: [id number].   PA approved.   Your PA request has been approved. Additional information will be provided in the approval communication. (Message [id number]) Authorization Expiration Date: 01/05/2024

## 2023-01-09 DIAGNOSIS — E11649 Type 2 diabetes mellitus with hypoglycemia without coma: Secondary | ICD-10-CM | POA: Diagnosis not present

## 2023-01-09 DIAGNOSIS — Z85118 Personal history of other malignant neoplasm of bronchus and lung: Secondary | ICD-10-CM | POA: Diagnosis not present

## 2023-01-09 DIAGNOSIS — E162 Hypoglycemia, unspecified: Secondary | ICD-10-CM | POA: Diagnosis not present

## 2023-01-09 DIAGNOSIS — Z515 Encounter for palliative care: Secondary | ICD-10-CM | POA: Diagnosis not present

## 2023-01-09 DIAGNOSIS — R000 Tachycardia, unspecified: Secondary | ICD-10-CM | POA: Diagnosis not present

## 2023-01-09 DIAGNOSIS — R918 Other nonspecific abnormal finding of lung field: Secondary | ICD-10-CM | POA: Diagnosis not present

## 2023-01-09 DIAGNOSIS — J9811 Atelectasis: Secondary | ICD-10-CM | POA: Diagnosis not present

## 2023-01-09 DIAGNOSIS — Z66 Do not resuscitate: Secondary | ICD-10-CM | POA: Diagnosis not present

## 2023-01-09 DIAGNOSIS — R0602 Shortness of breath: Secondary | ICD-10-CM | POA: Diagnosis not present

## 2023-01-09 DIAGNOSIS — J90 Pleural effusion, not elsewhere classified: Secondary | ICD-10-CM | POA: Diagnosis not present

## 2023-01-11 ENCOUNTER — Telehealth: Payer: Self-pay

## 2023-01-11 NOTE — Telephone Encounter (Signed)
Tammi his relative [employer]. [phone]. She is one of her DPOA supposed to call this week to decide on which hospice to refer to. Would you call her and see if he has an hospice care yet and refer accordingly if need. Thank you.

## 2023-01-15 ENCOUNTER — Other Ambulatory Visit (HOSPITAL_BASED_OUTPATIENT_CLINIC_OR_DEPARTMENT_OTHER): Payer: Self-pay

## 2023-01-16 ENCOUNTER — Other Ambulatory Visit: Payer: Self-pay | Admitting: Family Medicine

## 2023-01-16 DIAGNOSIS — I10 Essential (primary) hypertension: Secondary | ICD-10-CM

## 2023-01-18 ENCOUNTER — Other Ambulatory Visit (HOSPITAL_BASED_OUTPATIENT_CLINIC_OR_DEPARTMENT_OTHER): Payer: Self-pay

## 2023-01-28 DEATH — deceased
